# Patient Record
Sex: Female | Born: 1935 | Race: White | Hispanic: No | Smoking: Never smoker
Health system: Southern US, Community
[De-identification: ages and names within clinical notes are randomized; demographics above are authoritative.]

## PROBLEM LIST (undated history)

## (undated) DIAGNOSIS — R011 Cardiac murmur, unspecified: Secondary | ICD-10-CM

## (undated) DIAGNOSIS — Z8679 Personal history of other diseases of the circulatory system: Secondary | ICD-10-CM

## (undated) DIAGNOSIS — I491 Atrial premature depolarization: Secondary | ICD-10-CM

## (undated) DIAGNOSIS — D62 Acute posthemorrhagic anemia: Secondary | ICD-10-CM

## (undated) DIAGNOSIS — I2510 Atherosclerotic heart disease of native coronary artery without angina pectoris: Secondary | ICD-10-CM

## (undated) DIAGNOSIS — K219 Gastro-esophageal reflux disease without esophagitis: Secondary | ICD-10-CM

## (undated) DIAGNOSIS — J189 Pneumonia, unspecified organism: Secondary | ICD-10-CM

## (undated) DIAGNOSIS — S32020A Wedge compression fracture of second lumbar vertebra, initial encounter for closed fracture: Secondary | ICD-10-CM

## (undated) DIAGNOSIS — S62109A Fracture of unspecified carpal bone, unspecified wrist, initial encounter for closed fracture: Secondary | ICD-10-CM

## (undated) DIAGNOSIS — I341 Nonrheumatic mitral (valve) prolapse: Secondary | ICD-10-CM

## (undated) DIAGNOSIS — C801 Malignant (primary) neoplasm, unspecified: Secondary | ICD-10-CM

## (undated) DIAGNOSIS — I351 Nonrheumatic aortic (valve) insufficiency: Secondary | ICD-10-CM

## (undated) DIAGNOSIS — I712 Thoracic aortic aneurysm, without rupture: Principal | ICD-10-CM

## (undated) DIAGNOSIS — R55 Syncope and collapse: Secondary | ICD-10-CM

## (undated) DIAGNOSIS — S22000A Wedge compression fracture of unspecified thoracic vertebra, initial encounter for closed fracture: Secondary | ICD-10-CM

## (undated) DIAGNOSIS — I10 Essential (primary) hypertension: Secondary | ICD-10-CM

## (undated) DIAGNOSIS — M810 Age-related osteoporosis without current pathological fracture: Secondary | ICD-10-CM

## (undated) DIAGNOSIS — M1990 Unspecified osteoarthritis, unspecified site: Secondary | ICD-10-CM

## (undated) DIAGNOSIS — E039 Hypothyroidism, unspecified: Secondary | ICD-10-CM

## (undated) DIAGNOSIS — I314 Cardiac tamponade: Secondary | ICD-10-CM

## (undated) DIAGNOSIS — I4891 Unspecified atrial fibrillation: Secondary | ICD-10-CM

## (undated) DIAGNOSIS — E8770 Fluid overload, unspecified: Secondary | ICD-10-CM

## (undated) DIAGNOSIS — Z9289 Personal history of other medical treatment: Secondary | ICD-10-CM

## (undated) DIAGNOSIS — Z8639 Personal history of other endocrine, nutritional and metabolic disease: Secondary | ICD-10-CM

## (undated) DIAGNOSIS — N390 Urinary tract infection, site not specified: Secondary | ICD-10-CM

## (undated) HISTORY — DX: Atrial premature depolarization: I49.1

## (undated) HISTORY — DX: Cardiac tamponade: I31.4

## (undated) HISTORY — DX: Age-related osteoporosis without current pathological fracture: M81.0

## (undated) HISTORY — DX: Personal history of other endocrine, nutritional and metabolic disease: Z86.39

## (undated) HISTORY — DX: Thoracic aortic aneurysm, without rupture: I71.2

## (undated) HISTORY — PX: TONSILLECTOMY: SUR1361

## (undated) HISTORY — PX: COLONOSCOPY: SHX174

## (undated) HISTORY — PX: BREAST SURGERY: SHX581

## (undated) HISTORY — DX: Acute posthemorrhagic anemia: D62

## (undated) HISTORY — PX: ABLATION: SHX5711

## (undated) HISTORY — PX: OTHER SURGICAL HISTORY: SHX169

## (undated) HISTORY — DX: Personal history of other diseases of the circulatory system: Z86.79

## (undated) HISTORY — PX: TOE OSTEOTOMY: SHX1071

## (undated) HISTORY — DX: Unspecified atrial fibrillation: I48.91

## (undated) HISTORY — DX: Fluid overload, unspecified: E87.70

## (undated) HISTORY — DX: Nonrheumatic aortic (valve) insufficiency: I35.1

## (undated) HISTORY — PX: APPENDECTOMY: SHX54

## (undated) HISTORY — DX: Wedge compression fracture of second lumbar vertebra, initial encounter for closed fracture: S32.020A

## (undated) HISTORY — PX: HIP FRACTURE SURGERY: SHX118

---

## 1964-08-29 HISTORY — PX: TUBAL LIGATION: SHX77

## 1991-08-30 HISTORY — PX: HERNIA REPAIR: SHX51

## 1993-08-29 HISTORY — PX: PERCUTANEOUS PINNING WRIST FRACTURE: SHX2211

## 1999-01-13 ENCOUNTER — Ambulatory Visit (HOSPITAL_COMMUNITY): Admission: RE | Admit: 1999-01-13 | Discharge: 1999-01-13 | Payer: Self-pay | Admitting: Obstetrics & Gynecology

## 2000-09-27 ENCOUNTER — Other Ambulatory Visit: Admission: RE | Admit: 2000-09-27 | Discharge: 2000-09-27 | Payer: Self-pay | Admitting: Obstetrics & Gynecology

## 2001-08-29 HISTORY — PX: EYE SURGERY: SHX253

## 2002-12-30 ENCOUNTER — Other Ambulatory Visit: Admission: RE | Admit: 2002-12-30 | Discharge: 2002-12-30 | Payer: Self-pay | Admitting: Obstetrics & Gynecology

## 2005-03-24 ENCOUNTER — Other Ambulatory Visit: Admission: RE | Admit: 2005-03-24 | Discharge: 2005-03-24 | Payer: Self-pay | Admitting: Obstetrics & Gynecology

## 2009-01-08 ENCOUNTER — Encounter: Admission: RE | Admit: 2009-01-08 | Discharge: 2009-01-08 | Payer: Self-pay | Admitting: Emergency Medicine

## 2009-01-12 ENCOUNTER — Encounter: Admission: RE | Admit: 2009-01-12 | Discharge: 2009-01-12 | Payer: Self-pay | Admitting: Emergency Medicine

## 2009-01-15 ENCOUNTER — Ambulatory Visit (HOSPITAL_COMMUNITY): Admission: RE | Admit: 2009-01-15 | Discharge: 2009-01-15 | Payer: Self-pay | Admitting: Neurosurgery

## 2009-01-15 ENCOUNTER — Encounter: Payer: Self-pay | Admitting: Neurosurgery

## 2009-03-13 ENCOUNTER — Ambulatory Visit (HOSPITAL_COMMUNITY): Admission: RE | Admit: 2009-03-13 | Discharge: 2009-03-13 | Payer: Self-pay | Admitting: Neurosurgery

## 2009-03-17 HISTORY — PX: OTHER SURGICAL HISTORY: SHX169

## 2009-08-29 DIAGNOSIS — D62 Acute posthemorrhagic anemia: Secondary | ICD-10-CM

## 2009-08-29 DIAGNOSIS — E8770 Fluid overload, unspecified: Secondary | ICD-10-CM

## 2009-08-29 HISTORY — DX: Fluid overload, unspecified: E87.70

## 2009-08-29 HISTORY — DX: Acute posthemorrhagic anemia: D62

## 2010-05-29 DIAGNOSIS — I712 Thoracic aortic aneurysm, without rupture, unspecified: Secondary | ICD-10-CM

## 2010-05-29 DIAGNOSIS — I314 Cardiac tamponade: Secondary | ICD-10-CM

## 2010-05-29 HISTORY — DX: Thoracic aortic aneurysm, without rupture, unspecified: I71.20

## 2010-05-29 HISTORY — DX: Cardiac tamponade: I31.4

## 2010-05-29 HISTORY — DX: Thoracic aortic aneurysm, without rupture: I71.2

## 2010-06-24 ENCOUNTER — Inpatient Hospital Stay (HOSPITAL_COMMUNITY): Admission: EM | Admit: 2010-06-24 | Discharge: 2010-07-09 | Payer: Self-pay | Admitting: Emergency Medicine

## 2010-06-25 ENCOUNTER — Ambulatory Visit: Payer: Self-pay | Admitting: Cardiothoracic Surgery

## 2010-06-25 ENCOUNTER — Encounter: Payer: Self-pay | Admitting: Cardiothoracic Surgery

## 2010-06-25 HISTORY — PX: OTHER SURGICAL HISTORY: SHX169

## 2010-07-06 ENCOUNTER — Ambulatory Visit: Payer: Self-pay | Admitting: Physical Medicine & Rehabilitation

## 2010-07-09 ENCOUNTER — Inpatient Hospital Stay (HOSPITAL_COMMUNITY)
Admission: RE | Admit: 2010-07-09 | Discharge: 2010-07-15 | Payer: Self-pay | Admitting: Physical Medicine & Rehabilitation

## 2010-07-09 ENCOUNTER — Ambulatory Visit: Payer: Self-pay | Admitting: Physical Medicine & Rehabilitation

## 2010-07-14 ENCOUNTER — Encounter: Payer: Self-pay | Admitting: Cardiothoracic Surgery

## 2010-07-14 ENCOUNTER — Ambulatory Visit: Payer: Self-pay | Admitting: Vascular Surgery

## 2010-07-15 ENCOUNTER — Inpatient Hospital Stay (HOSPITAL_COMMUNITY): Admission: AD | Admit: 2010-07-15 | Discharge: 2010-07-21 | Payer: Self-pay | Admitting: Cardiovascular Disease

## 2010-07-15 DIAGNOSIS — F4320 Adjustment disorder, unspecified: Secondary | ICD-10-CM

## 2010-07-29 ENCOUNTER — Encounter: Admission: RE | Admit: 2010-07-29 | Discharge: 2010-07-29 | Payer: Self-pay | Admitting: Cardiothoracic Surgery

## 2010-07-29 ENCOUNTER — Encounter: Payer: Self-pay | Admitting: Internal Medicine

## 2010-07-29 ENCOUNTER — Ambulatory Visit: Payer: Self-pay | Admitting: Cardiothoracic Surgery

## 2010-08-06 ENCOUNTER — Encounter: Payer: Self-pay | Admitting: Internal Medicine

## 2010-08-17 ENCOUNTER — Ambulatory Visit
Admission: RE | Admit: 2010-08-17 | Discharge: 2010-08-17 | Payer: Self-pay | Source: Home / Self Care | Admitting: Cardiovascular Disease

## 2010-08-17 ENCOUNTER — Ambulatory Visit (HOSPITAL_COMMUNITY)
Admission: RE | Admit: 2010-08-17 | Discharge: 2010-08-17 | Payer: Self-pay | Source: Home / Self Care | Attending: Cardiovascular Disease | Admitting: Cardiovascular Disease

## 2010-09-02 DIAGNOSIS — I10 Essential (primary) hypertension: Secondary | ICD-10-CM | POA: Insufficient documentation

## 2010-09-02 DIAGNOSIS — E039 Hypothyroidism, unspecified: Secondary | ICD-10-CM | POA: Insufficient documentation

## 2010-09-03 ENCOUNTER — Ambulatory Visit
Admission: RE | Admit: 2010-09-03 | Discharge: 2010-09-03 | Payer: Self-pay | Source: Home / Self Care | Attending: Internal Medicine | Admitting: Internal Medicine

## 2010-09-03 ENCOUNTER — Encounter: Payer: Self-pay | Admitting: Internal Medicine

## 2010-09-03 ENCOUNTER — Telehealth: Payer: Self-pay | Admitting: Internal Medicine

## 2010-09-05 DIAGNOSIS — I471 Supraventricular tachycardia, unspecified: Secondary | ICD-10-CM | POA: Insufficient documentation

## 2010-09-30 NOTE — Letter (Signed)
Summary: ELectrophysiology/Ablation Procedure Instructions  [employer], Main Office  [address]   [place], [place] [address]   Phone: [phone]  Fax: [phone]     Electrophysiology/Ablation Procedure Instructions    You are scheduled for a(n) SVT Ablation on October 26, 2010 at 11:00 am with Dr. Johney Frame.  1.  Please come to the Short Stay Center at [facility] at 9:00 am on the day of your procedure.  2.  Come prepared to stay overnight.   Please bring your insurance cards and a list of your medications.  3.  Come to the [place] office on October 22, 2010 at 9:30 am for lab work.  The lab at [facility] is open from 8:30 AM to 1:30 PM and 2:30 PM to 5:00 PM.  The lab at [facility] is open from 7:30 AM to 5:30 PM.  You do not have to be fasting.  4.  Do not have anything to eat or drink after midnight the night before your procedure.  5.  Educational material received:  _____ EP   _x____ Ablation   * Occasionally, EP studies and ablations can become lengthy.  Please make your family aware of this before your procedure starts.  Average time ranges from 2-8 hours for EP studies/ablations.  Your physician will locate your family after the procedure with the results.  * If you have any questions after you get home, please call the office at [phone]. Dennis Bast, RN

## 2010-09-30 NOTE — Letter (Signed)
Summary: [facility]  [facility]   Imported By: Marylou Mccoy 08/06/2010 12:48:43  _____________________________________________________________________  External Attachment:    Type:   Image     Comment:   External Document

## 2010-09-30 NOTE — Progress Notes (Signed)
Summary: change appt for Ablation Procedure  Phone Note Call from Patient Call back at [phone]   Caller: Daughter/jenny Reason for Call: Talk to Nurse Summary of Call: pt needs to change appt date for Ablation Procedure Initial call taken by: Roe Coombs,  September 03, 2010 3:11 PM  Follow-up for Phone Call        left msg for pt dtr. Yvonne Gladden, RN, BSN 09/03/10 1710 pt dtr wants to move procedure to 2/21 and lab to 17 at 930. adv will call and get time on Monday. Follow-up by: Yvonne Gladden RN,  September 03, 2010 6:26 PM  Additional Follow-up for Phone Call Additional follow up Details #1::        called daughter back and left message with procedure date change  Yvonne [name] rescheduled for pt Yvonne Bast, RN, BSN  September 07, 2010 12:27 PM

## 2010-09-30 NOTE — Letter (Signed)
Summary: [facility]  [facility]   Imported By: Marylou Mccoy 09/20/2010 15:59:29  _____________________________________________________________________  External Attachment:    Type:   Image     Comment:   External Document

## 2010-09-30 NOTE — Assessment & Plan Note (Addendum)
Summary: new ep/atrial tach/amber   Visit Type:  Initial Consult  CC:   .  History of Present Illness: The patient presents today for electrophysiology followup. I saw her during her recent hospitalizaiton at [facility] after her presentation on June 24, 2010, with a ruptured ascending aorta.  On June 25, 2010, the diagnosis was made and she underwent  emergency replacement of the ascending aorta, hemiarch, and resuspension of the aortic valve with circulatory arrest.  She had a fairly long postoperative course, but not unreasonable considering her age of 51 years and fairly frail status.  Postoperatively, she developed a narrow complex tachycardia which terminated with carotid massage.  She was observed to have recurrence of this tachycardia which did not respond to diltiazem or amiodarone but has been well controlled on flecainide. She reports abrupt onset and termination of tachycardia for several years.  She has taken flecainide previously with good control.  She is unaware of any further arrhythmias since discharge. She has a lymphocele over her R groin which has required draining.  She is otherwise without complaint today.  Current Medications (verified): 1)  Tambocor 50 Mg Tabs (Flecainide Acetate) .Marland Kitchen.. 1 Two Times A Day 2)  Synthroid 50 Mcg Tabs (Levothyroxine Sodium) .Marland Kitchen.. 1 Once Daily 3)  Metoprolol Succinate 50 Mg Xr24h-Tab (Metoprolol Succinate) .Marland Kitchen.. 1 Q D 4)  Ecotrin 325 Mg Tbec (Aspirin) .Marland Kitchen.. 1 Once Daily 5)  Calcium/vitamin D/minerals 600-200 Mg-Unit Tabs (Calcium Carbonate-Vit D-Min) .Marland Kitchen.. 1 Two Times A Day 6)  Ferrous Gluconate 324 (38 Fe) Mg Tabs (Ferrous Gluconate) .Marland Kitchen.. 1 Once Daily 7)  Folic Acid 1 Mg Tabs (Folic Acid) .Marland Kitchen.. 1 Once Daily 8)  Celexa 10 Mg Tabs (Citalopram Hydrobromide) .Marland Kitchen.. 1 Once Daily  Past History:  Past Medical History:  1. SVT  2. Low-risk Myoview in September 2011 with normal left ventricular function.   3. Vascular disease with a ruptured  ascending aortic aneurysm  requiring urgent surgeries on June 25, 2010, by Dr. Tyrone Sage.  5. Right groin lymphocele, drained this admission by Cardiovascular- Thoracic Surgery, culture negative.  6. Treated hypothyroidism.  7. Depression.  8. HTN 9. DDD  Past Surgical History:  L2  kyphoplasty    s/p APPY 1951  s/p L hip replacement 1995  Ruptured aortic aneurysm repair 10/11  Bil wrist Fx 1994  hernia repair  Family History: Reviewed history from 09/02/2010 and no changes required. + strokes,  brother with AAA at age 47  Social History:  The patient lives with daughter.  denies TED.  Retired Engineer, civil (consulting)  Review of Systems       All systems are reviewed and negative except as listed in the HPI.   Vital Signs:  Patient profile:   77 year old female Height:      67 inches Weight:      118.50 pounds BMI:     18.63 Pulse rate:   58 / minute Pulse rhythm:   regular BP sitting:   86 / 40  (left arm) Cuff size:   regular  Vitals Entered By: Scherrie Bateman, LPN (September 03, 2010 10:25 AM)  Physical Exam  General:  thin elderly remale, NAD Head:  normocephalic and atraumatic Eyes:  PERRLA/EOM intact; conjunctiva and lids normal. Mouth:  Teeth, gums and palate normal. Oral mucosa normal. Neck:  supple Chest Wall:  healed sternotomy Lungs:  Clear bilaterally to auscultation and percussion. Heart:  RRR, no m/r/g Abdomen:  Bowel sounds positive; abdomen soft and non-tender without masses, organomegaly,  or hernias noted. No hepatosplenomegaly. Msk:  Back normal, normal gait. Muscle strength and tone normal. Extremities:  No clubbing or cyanosis.  1cm x 2cm lymphocele R groin Neurologic:  Alert and oriented x 3. Skin:  Intact without lesions or rashes. Psych:  Normal affect.   EKG  Procedure date:  09/03/2010  Findings:      sinus bradycardia 58 bpm, PR 228, Qtc 460, nonspecific ST/T changes  Impression & Recommendations:  Problem # 1:  SUPRAVENTRICULAR TACHYCARDIA  (ICD-427.89) The patient has recurrent symptomatic SVT.  During her recent hospitalization, she was observed to have a mid RP tachycardia which terminated with carotid massage.  Therapeutic strategies for SVT including medicine and ablation were discussed in detail with the patient today. Risk, benefits, and alternatives to EP study and radiofrequency ablation were also discussed in detail today. These risks include but are not limited to stroke, bleeding, vascular damage, tamponade, perforation, damage to the heart and other structures,  AV block requiring pacemaker, worsening renal function, and death. The patient understands these risk and wishes to proceed.  We will therefore schedule catheter ablation at the next available time.  Problem # 2:  HYPERTENSION (ICD-401.9) stable  Her updated medication list for this problem includes:    Metoprolol Succinate 50 Mg Xr24h-tab (Metoprolol succinate) .Marland Kitchen... 1 q d    Ecotrin 325 Mg Tbec (Aspirin) .Marland Kitchen... 1 once daily  Problem # 3:  HYPOTHYROIDISM (ICD-244.9) stable Her updated medication list for this problem includes:    Synthroid 50 Mcg Tabs (Levothyroxine sodium) .Marland Kitchen... 1 once daily  Patient Instructions: 1)  Your physician recommends that you continue on your current medications as directed. Please refer to the Current Medication list given to you today. 2)  Your physician has recommended that you have an ablation.  Catheter ablation is a medical procedure used to treat some cardiac arrhythmias (irregular heartbeats). During catheter ablation, a long, thin, flexible tube is put into a blood vessel in your groin (upper thigh), or neck. This tube is called an ablation catheter. It is then guided to your heart through the blood vessel. Radiofrequency waves destroy small areas of heart tissue where abnormal heartbeats may cause an arrhythmia to start.  Please see the instruction sheet given to you today. 3)  Hold Tambocor 3 days before the procedure and  hold Metoprolol 2 days before the procedure.   Appended Document: new ep/atrial tach/amber    Clinical Lists Changes  Observations: Added new observation of NKA: T (10/07/2010 14:49)

## 2010-10-14 ENCOUNTER — Telehealth: Payer: Self-pay | Admitting: Internal Medicine

## 2010-10-14 ENCOUNTER — Other Ambulatory Visit: Payer: Self-pay

## 2010-10-15 ENCOUNTER — Encounter: Payer: Self-pay | Admitting: Internal Medicine

## 2010-10-15 ENCOUNTER — Other Ambulatory Visit (INDEPENDENT_AMBULATORY_CARE_PROVIDER_SITE_OTHER): Payer: Medicare Other

## 2010-10-15 ENCOUNTER — Other Ambulatory Visit: Payer: Self-pay | Admitting: Internal Medicine

## 2010-10-15 DIAGNOSIS — I499 Cardiac arrhythmia, unspecified: Secondary | ICD-10-CM

## 2010-10-15 DIAGNOSIS — I498 Other specified cardiac arrhythmias: Secondary | ICD-10-CM

## 2010-10-15 DIAGNOSIS — I10 Essential (primary) hypertension: Secondary | ICD-10-CM

## 2010-10-15 LAB — CBC WITH DIFFERENTIAL/PLATELET
Basophils Relative: 0.4 % (ref 0.0–3.0)
Eosinophils Absolute: 0.2 10*3/uL (ref 0.0–0.7)
Eosinophils Relative: 3.2 % (ref 0.0–5.0)
Hemoglobin: 12.1 g/dL (ref 12.0–15.0)
Lymphocytes Relative: 16.7 % (ref 12.0–46.0)
MCHC: 33.5 g/dL (ref 30.0–36.0)
Monocytes Relative: 6.8 % (ref 3.0–12.0)
Neutro Abs: 5.3 10*3/uL (ref 1.4–7.7)
RBC: 4.07 Mil/uL (ref 3.87–5.11)
WBC: 7.3 10*3/uL (ref 4.5–10.5)

## 2010-10-15 LAB — BASIC METABOLIC PANEL
CO2: 31 mEq/L (ref 19–32)
Calcium: 9.2 mg/dL (ref 8.4–10.5)
Chloride: 102 mEq/L (ref 96–112)
Sodium: 137 mEq/L (ref 135–145)

## 2010-10-15 LAB — PROTIME-INR: INR: 1 ratio (ref 0.8–1.0)

## 2010-10-19 ENCOUNTER — Observation Stay (HOSPITAL_COMMUNITY)
Admission: RE | Admit: 2010-10-19 | Discharge: 2010-10-20 | Disposition: A | Discharge status: Home / Self Care | Payer: Medicare Other | Source: Ambulatory Visit | Attending: Internal Medicine | Admitting: Internal Medicine

## 2010-10-19 DIAGNOSIS — F3289 Other specified depressive episodes: Secondary | ICD-10-CM | POA: Insufficient documentation

## 2010-10-19 DIAGNOSIS — I498 Other specified cardiac arrhythmias: Principal | ICD-10-CM | POA: Insufficient documentation

## 2010-10-19 DIAGNOSIS — I471 Supraventricular tachycardia: Secondary | ICD-10-CM

## 2010-10-19 DIAGNOSIS — E039 Hypothyroidism, unspecified: Secondary | ICD-10-CM | POA: Insufficient documentation

## 2010-10-19 DIAGNOSIS — I10 Essential (primary) hypertension: Secondary | ICD-10-CM | POA: Insufficient documentation

## 2010-10-19 DIAGNOSIS — IMO0002 Reserved for concepts with insufficient information to code with codable children: Secondary | ICD-10-CM | POA: Insufficient documentation

## 2010-10-19 DIAGNOSIS — F329 Major depressive disorder, single episode, unspecified: Secondary | ICD-10-CM | POA: Insufficient documentation

## 2010-10-20 DIAGNOSIS — I471 Supraventricular tachycardia: Secondary | ICD-10-CM

## 2010-10-20 HISTORY — PX: OTHER SURGICAL HISTORY: SHX169

## 2010-10-20 NOTE — Progress Notes (Signed)
Summary: confirm her mother ablation date/ time  Phone Note Call from Patient Call back at Home Phone [phone]   Caller: Daughter-jenny [phone] Reason for Call: Talk to Nurse Summary of Call: pt dtr wants to confirm her mother ablation date/ time. Initial call taken by: Lorne Skeens,  October 14, 2010 8:34 AM  Follow-up for Phone Call        spoke with daughter she is aware of time and will be here for labs tomorow Dennis Bast, RN, BSN  October 14, 2010 11:47 AM

## 2010-10-28 ENCOUNTER — Encounter (INDEPENDENT_AMBULATORY_CARE_PROVIDER_SITE_OTHER): Payer: Medicare Other | Admitting: Cardiothoracic Surgery

## 2010-10-28 DIAGNOSIS — I712 Thoracic aortic aneurysm, without rupture, unspecified: Secondary | ICD-10-CM

## 2010-10-29 NOTE — Assessment & Plan Note (Signed)
OFFICE VISIT  [name], Yvonne L DOB:  1925/12/19                                        October 28, 2010 CHART #:  [id number]  The patient returns to office today in followup after her ruptured ascending aorta with replacement of ascending aorta hemiarch, resuspension aortic valve with circulatory arrest on June 25, 2010. Both preoperatively and postoperatively, she had significant atrial arrhythmias.  Since surgery in January, she underwent AFib ablation by Dr. Johney Frame.  From all indications, this has gone well.  She is now waiting to have her painful left hip revised in late March.  Overall other than being wheelchair bound because of her hip, she is doing well.  Her appetite has returned.  She denies any chest pain.  On exam, her blood pressure 123/69, pulse 70, respiratory rate 16, O2 sats 97%.  Her sternum is stable and well healed.  Valve sounds are crisp without any murmur of aortic insufficiency.  She has no lower extremity edema.  Overall, she appears much improved from her last visit, awaiting now for her hip prosthesis repair.  I have made a return appointment to see me in 6 months with a CTA of the thoracic aorta to follow up on her previous aortic repair.  Sheliah Plane, MD Electronically Signed  EG/MEDQ  D:  10/28/2010  T:  10/28/2010  Job:  [id number]  cc:   Thurmon Fair, MD Hillis Range, MD Ollen Gross, M.D.

## 2010-11-09 LAB — BASIC METABOLIC PANEL
BUN: 10 mg/dL (ref 6–23)
BUN: 11 mg/dL (ref 6–23)
BUN: 13 mg/dL (ref 6–23)
BUN: 13 mg/dL (ref 6–23)
BUN: 14 mg/dL (ref 6–23)
BUN: 16 mg/dL (ref 6–23)
BUN: 16 mg/dL (ref 6–23)
BUN: 16 mg/dL (ref 6–23)
BUN: 19 mg/dL (ref 6–23)
BUN: 19 mg/dL (ref 6–23)
BUN: 20 mg/dL (ref 6–23)
BUN: 25 mg/dL — ABNORMAL HIGH (ref 6–23)
BUN: 28 mg/dL — ABNORMAL HIGH (ref 6–23)
BUN: 35 mg/dL — ABNORMAL HIGH (ref 6–23)
BUN: 37 mg/dL — ABNORMAL HIGH (ref 6–23)
BUN: 38 mg/dL — ABNORMAL HIGH (ref 6–23)
BUN: 7 mg/dL (ref 6–23)
BUN: 9 mg/dL (ref 6–23)
BUN: 9 mg/dL (ref 6–23)
CO2: 26 mEq/L (ref 19–32)
CO2: 27 mEq/L (ref 19–32)
CO2: 28 mEq/L (ref 19–32)
CO2: 28 mEq/L (ref 19–32)
CO2: 28 mEq/L (ref 19–32)
CO2: 30 mEq/L (ref 19–32)
CO2: 30 mEq/L (ref 19–32)
CO2: 31 mEq/L (ref 19–32)
CO2: 31 mEq/L (ref 19–32)
CO2: 33 mEq/L — ABNORMAL HIGH (ref 19–32)
CO2: 33 mEq/L — ABNORMAL HIGH (ref 19–32)
CO2: 33 mEq/L — ABNORMAL HIGH (ref 19–32)
CO2: 33 mEq/L — ABNORMAL HIGH (ref 19–32)
CO2: 33 mEq/L — ABNORMAL HIGH (ref 19–32)
CO2: 33 mEq/L — ABNORMAL HIGH (ref 19–32)
CO2: 34 mEq/L — ABNORMAL HIGH (ref 19–32)
CO2: 34 mEq/L — ABNORMAL HIGH (ref 19–32)
CO2: 34 mEq/L — ABNORMAL HIGH (ref 19–32)
CO2: 34 mEq/L — ABNORMAL HIGH (ref 19–32)
CO2: 36 mEq/L — ABNORMAL HIGH (ref 19–32)
Calcium: 7.6 mg/dL — ABNORMAL LOW (ref 8.4–10.5)
Calcium: 7.9 mg/dL — ABNORMAL LOW (ref 8.4–10.5)
Calcium: 8.1 mg/dL — ABNORMAL LOW (ref 8.4–10.5)
Calcium: 8.1 mg/dL — ABNORMAL LOW (ref 8.4–10.5)
Calcium: 8.3 mg/dL — ABNORMAL LOW (ref 8.4–10.5)
Calcium: 8.4 mg/dL (ref 8.4–10.5)
Calcium: 8.5 mg/dL (ref 8.4–10.5)
Calcium: 8.5 mg/dL (ref 8.4–10.5)
Calcium: 8.6 mg/dL (ref 8.4–10.5)
Calcium: 8.6 mg/dL (ref 8.4–10.5)
Calcium: 8.6 mg/dL (ref 8.4–10.5)
Calcium: 8.7 mg/dL (ref 8.4–10.5)
Calcium: 8.7 mg/dL (ref 8.4–10.5)
Calcium: 8.8 mg/dL (ref 8.4–10.5)
Calcium: 8.8 mg/dL (ref 8.4–10.5)
Calcium: 8.8 mg/dL (ref 8.4–10.5)
Calcium: 8.9 mg/dL (ref 8.4–10.5)
Chloride: 100 mEq/L (ref 96–112)
Chloride: 101 mEq/L (ref 96–112)
Chloride: 101 mEq/L (ref 96–112)
Chloride: 101 mEq/L (ref 96–112)
Chloride: 101 mEq/L (ref 96–112)
Chloride: 102 mEq/L (ref 96–112)
Chloride: 103 mEq/L (ref 96–112)
Chloride: 104 mEq/L (ref 96–112)
Chloride: 104 mEq/L (ref 96–112)
Chloride: 106 mEq/L (ref 96–112)
Chloride: 108 mEq/L (ref 96–112)
Chloride: 96 mEq/L (ref 96–112)
Chloride: 98 mEq/L (ref 96–112)
Chloride: 98 mEq/L (ref 96–112)
Chloride: 98 mEq/L (ref 96–112)
Chloride: 98 mEq/L (ref 96–112)
Chloride: 98 mEq/L (ref 96–112)
Chloride: 99 mEq/L (ref 96–112)
Chloride: 99 mEq/L (ref 96–112)
Creatinine, Ser: 0.84 mg/dL (ref 0.4–1.2)
Creatinine, Ser: 0.9 mg/dL (ref 0.4–1.2)
Creatinine, Ser: 0.91 mg/dL (ref 0.4–1.2)
Creatinine, Ser: 0.93 mg/dL (ref 0.4–1.2)
Creatinine, Ser: 0.93 mg/dL (ref 0.4–1.2)
Creatinine, Ser: 0.96 mg/dL (ref 0.4–1.2)
Creatinine, Ser: 0.96 mg/dL (ref 0.4–1.2)
Creatinine, Ser: 0.97 mg/dL (ref 0.4–1.2)
Creatinine, Ser: 0.97 mg/dL (ref 0.4–1.2)
Creatinine, Ser: 0.99 mg/dL (ref 0.4–1.2)
Creatinine, Ser: 1.02 mg/dL (ref 0.4–1.2)
Creatinine, Ser: 1.06 mg/dL (ref 0.4–1.2)
Creatinine, Ser: 1.07 mg/dL (ref 0.4–1.2)
Creatinine, Ser: 1.11 mg/dL (ref 0.4–1.2)
Creatinine, Ser: 1.12 mg/dL (ref 0.4–1.2)
Creatinine, Ser: 1.19 mg/dL (ref 0.4–1.2)
Creatinine, Ser: 1.24 mg/dL — ABNORMAL HIGH (ref 0.4–1.2)
Creatinine, Ser: 1.25 mg/dL — ABNORMAL HIGH (ref 0.4–1.2)
Creatinine, Ser: 1.3 mg/dL — ABNORMAL HIGH (ref 0.4–1.2)
GFR calc Af Amer: 48 mL/min — ABNORMAL LOW (ref >60)
GFR calc Af Amer: 50 mL/min — ABNORMAL LOW (ref >60)
GFR calc Af Amer: 50 mL/min — ABNORMAL LOW (ref >60)
GFR calc Af Amer: 53 mL/min — ABNORMAL LOW (ref >60)
GFR calc Af Amer: 56 mL/min — ABNORMAL LOW (ref >60)
GFR calc Af Amer: 57 mL/min — ABNORMAL LOW (ref >60)
GFR calc Af Amer: 59 mL/min — ABNORMAL LOW (ref >60)
GFR calc Af Amer: >60 mL/min (ref >60)
GFR calc Af Amer: >60 mL/min (ref >60)
GFR calc Af Amer: >60 mL/min (ref >60)
GFR calc Af Amer: >60 mL/min (ref >60)
GFR calc Af Amer: >60 mL/min (ref >60)
GFR calc Af Amer: >60 mL/min (ref >60)
GFR calc Af Amer: >60 mL/min (ref >60)
GFR calc Af Amer: >60 mL/min (ref >60)
GFR calc Af Amer: >60 mL/min (ref >60)
GFR calc Af Amer: >60 mL/min (ref >60)
GFR calc non Af Amer: 39 mL/min — ABNORMAL LOW (ref >60)
GFR calc non Af Amer: 41 mL/min — ABNORMAL LOW (ref >60)
GFR calc non Af Amer: 42 mL/min — ABNORMAL LOW (ref >60)
GFR calc non Af Amer: 44 mL/min — ABNORMAL LOW (ref >60)
GFR calc non Af Amer: 47 mL/min — ABNORMAL LOW (ref >60)
GFR calc non Af Amer: 49 mL/min — ABNORMAL LOW (ref >60)
GFR calc non Af Amer: 50 mL/min — ABNORMAL LOW (ref >60)
GFR calc non Af Amer: 52 mL/min — ABNORMAL LOW (ref >60)
GFR calc non Af Amer: 55 mL/min — ABNORMAL LOW (ref >60)
GFR calc non Af Amer: 55 mL/min — ABNORMAL LOW (ref >60)
GFR calc non Af Amer: 56 mL/min — ABNORMAL LOW (ref >60)
GFR calc non Af Amer: 57 mL/min — ABNORMAL LOW (ref >60)
GFR calc non Af Amer: 58 mL/min — ABNORMAL LOW (ref >60)
GFR calc non Af Amer: 58 mL/min — ABNORMAL LOW (ref >60)
GFR calc non Af Amer: 59 mL/min — ABNORMAL LOW (ref >60)
GFR calc non Af Amer: 60 mL/min — ABNORMAL LOW (ref >60)
GFR calc non Af Amer: >60 mL/min (ref >60)
Glucose, Bld: 100 mg/dL — ABNORMAL HIGH (ref 70–99)
Glucose, Bld: 102 mg/dL — ABNORMAL HIGH (ref 70–99)
Glucose, Bld: 105 mg/dL — ABNORMAL HIGH (ref 70–99)
Glucose, Bld: 105 mg/dL — ABNORMAL HIGH (ref 70–99)
Glucose, Bld: 107 mg/dL — ABNORMAL HIGH (ref 70–99)
Glucose, Bld: 109 mg/dL — ABNORMAL HIGH (ref 70–99)
Glucose, Bld: 114 mg/dL — ABNORMAL HIGH (ref 70–99)
Glucose, Bld: 115 mg/dL — ABNORMAL HIGH (ref 70–99)
Glucose, Bld: 117 mg/dL — ABNORMAL HIGH (ref 70–99)
Glucose, Bld: 120 mg/dL — ABNORMAL HIGH (ref 70–99)
Glucose, Bld: 126 mg/dL — ABNORMAL HIGH (ref 70–99)
Glucose, Bld: 129 mg/dL — ABNORMAL HIGH (ref 70–99)
Glucose, Bld: 132 mg/dL — ABNORMAL HIGH (ref 70–99)
Glucose, Bld: 149 mg/dL — ABNORMAL HIGH (ref 70–99)
Glucose, Bld: 168 mg/dL — ABNORMAL HIGH (ref 70–99)
Glucose, Bld: 232 mg/dL — ABNORMAL HIGH (ref 70–99)
Glucose, Bld: 95 mg/dL (ref 70–99)
Glucose, Bld: 99 mg/dL (ref 70–99)
Potassium: 3.5 mEq/L (ref 3.5–5.1)
Potassium: 3.6 mEq/L (ref 3.5–5.1)
Potassium: 3.7 mEq/L (ref 3.5–5.1)
Potassium: 3.8 mEq/L (ref 3.5–5.1)
Potassium: 3.9 mEq/L (ref 3.5–5.1)
Potassium: 4 mEq/L (ref 3.5–5.1)
Potassium: 4 mEq/L (ref 3.5–5.1)
Potassium: 4 mEq/L (ref 3.5–5.1)
Potassium: 4 mEq/L (ref 3.5–5.1)
Potassium: 4.2 mEq/L (ref 3.5–5.1)
Potassium: 4.3 mEq/L (ref 3.5–5.1)
Potassium: 4.3 mEq/L (ref 3.5–5.1)
Potassium: 4.3 mEq/L (ref 3.5–5.1)
Potassium: 4.4 mEq/L (ref 3.5–5.1)
Potassium: 4.4 mEq/L (ref 3.5–5.1)
Potassium: 4.5 mEq/L (ref 3.5–5.1)
Potassium: 4.7 mEq/L (ref 3.5–5.1)
Sodium: 131 mEq/L — ABNORMAL LOW (ref 135–145)
Sodium: 136 mEq/L (ref 135–145)
Sodium: 137 mEq/L (ref 135–145)
Sodium: 137 mEq/L (ref 135–145)
Sodium: 137 mEq/L (ref 135–145)
Sodium: 137 mEq/L (ref 135–145)
Sodium: 138 mEq/L (ref 135–145)
Sodium: 138 mEq/L (ref 135–145)
Sodium: 139 mEq/L (ref 135–145)
Sodium: 139 mEq/L (ref 135–145)
Sodium: 139 mEq/L (ref 135–145)
Sodium: 139 mEq/L (ref 135–145)
Sodium: 139 mEq/L (ref 135–145)
Sodium: 140 mEq/L (ref 135–145)
Sodium: 140 mEq/L (ref 135–145)
Sodium: 141 mEq/L (ref 135–145)
Sodium: 141 mEq/L (ref 135–145)

## 2010-11-09 LAB — CBC
HCT: 25.5 % — ABNORMAL LOW (ref 36.0–46.0)
HCT: 26.2 % — ABNORMAL LOW (ref 36.0–46.0)
HCT: 26.4 % — ABNORMAL LOW (ref 36.0–46.0)
HCT: 26.5 % — ABNORMAL LOW (ref 36.0–46.0)
HCT: 26.7 % — ABNORMAL LOW (ref 36.0–46.0)
HCT: 27.2 % — ABNORMAL LOW (ref 36.0–46.0)
HCT: 27.6 % — ABNORMAL LOW (ref 36.0–46.0)
HCT: 27.7 % — ABNORMAL LOW (ref 36.0–46.0)
HCT: 28 % — ABNORMAL LOW (ref 36.0–46.0)
HCT: 28.1 % — ABNORMAL LOW (ref 36.0–46.0)
HCT: 28.1 % — ABNORMAL LOW (ref 36.0–46.0)
HCT: 28.2 % — ABNORMAL LOW (ref 36.0–46.0)
HCT: 28.3 % — ABNORMAL LOW (ref 36.0–46.0)
HCT: 28.4 % — ABNORMAL LOW (ref 36.0–46.0)
HCT: 28.5 % — ABNORMAL LOW (ref 36.0–46.0)
HCT: 28.8 % — ABNORMAL LOW (ref 36.0–46.0)
HCT: 28.9 % — ABNORMAL LOW (ref 36.0–46.0)
HCT: 30.4 % — ABNORMAL LOW (ref 36.0–46.0)
HCT: 30.5 % — ABNORMAL LOW (ref 36.0–46.0)
HCT: 30.6 % — ABNORMAL LOW (ref 36.0–46.0)
Hemoglobin: 8.3 g/dL — ABNORMAL LOW (ref 12.0–15.0)
Hemoglobin: 8.4 g/dL — ABNORMAL LOW (ref 12.0–15.0)
Hemoglobin: 8.5 g/dL — ABNORMAL LOW (ref 12.0–15.0)
Hemoglobin: 8.7 g/dL — ABNORMAL LOW (ref 12.0–15.0)
Hemoglobin: 8.8 g/dL — ABNORMAL LOW (ref 12.0–15.0)
Hemoglobin: 8.8 g/dL — ABNORMAL LOW (ref 12.0–15.0)
Hemoglobin: 8.9 g/dL — ABNORMAL LOW (ref 12.0–15.0)
Hemoglobin: 9 g/dL — ABNORMAL LOW (ref 12.0–15.0)
Hemoglobin: 9 g/dL — ABNORMAL LOW (ref 12.0–15.0)
Hemoglobin: 9.1 g/dL — ABNORMAL LOW (ref 12.0–15.0)
Hemoglobin: 9.1 g/dL — ABNORMAL LOW (ref 12.0–15.0)
Hemoglobin: 9.1 g/dL — ABNORMAL LOW (ref 12.0–15.0)
Hemoglobin: 9.2 g/dL — ABNORMAL LOW (ref 12.0–15.0)
Hemoglobin: 9.6 g/dL — ABNORMAL LOW (ref 12.0–15.0)
Hemoglobin: 9.6 g/dL — ABNORMAL LOW (ref 12.0–15.0)
Hemoglobin: 9.6 g/dL — ABNORMAL LOW (ref 12.0–15.0)
Hemoglobin: 9.7 g/dL — ABNORMAL LOW (ref 12.0–15.0)
MCH: 29.9 pg (ref 26.0–34.0)
MCH: 30.2 pg (ref 26.0–34.0)
MCH: 30.3 pg (ref 26.0–34.0)
MCH: 30.3 pg (ref 26.0–34.0)
MCH: 30.3 pg (ref 26.0–34.0)
MCH: 30.5 pg (ref 26.0–34.0)
MCH: 30.6 pg (ref 26.0–34.0)
MCH: 30.6 pg (ref 26.0–34.0)
MCH: 30.7 pg (ref 26.0–34.0)
MCH: 30.7 pg (ref 26.0–34.0)
MCH: 30.7 pg (ref 26.0–34.0)
MCH: 30.7 pg (ref 26.0–34.0)
MCH: 30.8 pg (ref 26.0–34.0)
MCH: 30.8 pg (ref 26.0–34.0)
MCH: 30.9 pg (ref 26.0–34.0)
MCH: 31 pg (ref 26.0–34.0)
MCH: 31.1 pg (ref 26.0–34.0)
MCH: 31.1 pg (ref 26.0–34.0)
MCH: 31.3 pg (ref 26.0–34.0)
MCH: 31.3 pg (ref 26.0–34.0)
MCHC: 31.4 g/dL (ref 30.0–36.0)
MCHC: 31.6 g/dL (ref 30.0–36.0)
MCHC: 31.6 g/dL (ref 30.0–36.0)
MCHC: 31.7 g/dL (ref 30.0–36.0)
MCHC: 31.9 g/dL (ref 30.0–36.0)
MCHC: 32.1 g/dL (ref 30.0–36.0)
MCHC: 32.1 g/dL (ref 30.0–36.0)
MCHC: 32.1 g/dL (ref 30.0–36.0)
MCHC: 32.2 g/dL (ref 30.0–36.0)
MCHC: 32.4 g/dL (ref 30.0–36.0)
MCHC: 32.4 g/dL (ref 30.0–36.0)
MCHC: 32.5 g/dL (ref 30.0–36.0)
MCHC: 32.6 g/dL (ref 30.0–36.0)
MCHC: 32.6 g/dL (ref 30.0–36.0)
MCHC: 33 g/dL (ref 30.0–36.0)
MCHC: 33 g/dL (ref 30.0–36.0)
MCHC: 33.2 g/dL (ref 30.0–36.0)
MCHC: 33.7 g/dL (ref 30.0–36.0)
MCHC: 33.8 g/dL (ref 30.0–36.0)
MCV: 91.7 fL (ref 78.0–100.0)
MCV: 92.3 fL (ref 78.0–100.0)
MCV: 92.5 fL (ref 78.0–100.0)
MCV: 93 fL (ref 78.0–100.0)
MCV: 93.4 fL (ref 78.0–100.0)
MCV: 93.8 fL (ref 78.0–100.0)
MCV: 94.4 fL (ref 78.0–100.0)
MCV: 94.9 fL (ref 78.0–100.0)
MCV: 94.9 fL (ref 78.0–100.0)
MCV: 95 fL (ref 78.0–100.0)
MCV: 95.2 fL (ref 78.0–100.0)
MCV: 95.3 fL (ref 78.0–100.0)
MCV: 95.5 fL (ref 78.0–100.0)
MCV: 95.6 fL (ref 78.0–100.0)
MCV: 95.6 fL (ref 78.0–100.0)
MCV: 95.8 fL (ref 78.0–100.0)
MCV: 96 fL (ref 78.0–100.0)
MCV: 96.1 fL (ref 78.0–100.0)
MCV: 96.2 fL (ref 78.0–100.0)
MCV: 96.2 fL (ref 78.0–100.0)
MCV: 97.3 fL (ref 78.0–100.0)
Platelets: 103 10*3/uL — ABNORMAL LOW (ref 150–400)
Platelets: 106 10*3/uL — ABNORMAL LOW (ref 150–400)
Platelets: 112 10*3/uL — ABNORMAL LOW (ref 150–400)
Platelets: 153 10*3/uL (ref 150–400)
Platelets: 156 10*3/uL (ref 150–400)
Platelets: 180 10*3/uL (ref 150–400)
Platelets: 192 10*3/uL (ref 150–400)
Platelets: 198 10*3/uL (ref 150–400)
Platelets: 211 10*3/uL (ref 150–400)
Platelets: 212 10*3/uL (ref 150–400)
Platelets: 225 10*3/uL (ref 150–400)
Platelets: 257 10*3/uL (ref 150–400)
Platelets: 260 10*3/uL (ref 150–400)
Platelets: 275 10*3/uL (ref 150–400)
Platelets: 283 10*3/uL (ref 150–400)
Platelets: 288 10*3/uL (ref 150–400)
Platelets: 84 10*3/uL — ABNORMAL LOW (ref 150–400)
Platelets: 96 10*3/uL — ABNORMAL LOW (ref 150–400)
Platelets: 97 10*3/uL — ABNORMAL LOW (ref 150–400)
RBC: 2.7 MIL/uL — ABNORMAL LOW (ref 3.87–5.11)
RBC: 2.74 MIL/uL — ABNORMAL LOW (ref 3.87–5.11)
RBC: 2.76 MIL/uL — ABNORMAL LOW (ref 3.87–5.11)
RBC: 2.84 MIL/uL — ABNORMAL LOW (ref 3.87–5.11)
RBC: 2.86 MIL/uL — ABNORMAL LOW (ref 3.87–5.11)
RBC: 2.89 MIL/uL — ABNORMAL LOW (ref 3.87–5.11)
RBC: 2.89 MIL/uL — ABNORMAL LOW (ref 3.87–5.11)
RBC: 2.9 MIL/uL — ABNORMAL LOW (ref 3.87–5.11)
RBC: 2.91 MIL/uL — ABNORMAL LOW (ref 3.87–5.11)
RBC: 2.94 MIL/uL — ABNORMAL LOW (ref 3.87–5.11)
RBC: 2.94 MIL/uL — ABNORMAL LOW (ref 3.87–5.11)
RBC: 2.96 MIL/uL — ABNORMAL LOW (ref 3.87–5.11)
RBC: 2.97 MIL/uL — ABNORMAL LOW (ref 3.87–5.11)
RBC: 3 MIL/uL — ABNORMAL LOW (ref 3.87–5.11)
RBC: 3.07 MIL/uL — ABNORMAL LOW (ref 3.87–5.11)
RBC: 3.13 MIL/uL — ABNORMAL LOW (ref 3.87–5.11)
RBC: 3.14 MIL/uL — ABNORMAL LOW (ref 3.87–5.11)
RBC: 3.17 MIL/uL — ABNORMAL LOW (ref 3.87–5.11)
RDW: 14.9 % (ref 11.5–15.5)
RDW: 15 % (ref 11.5–15.5)
RDW: 15.1 % (ref 11.5–15.5)
RDW: 15.2 % (ref 11.5–15.5)
RDW: 15.3 % (ref 11.5–15.5)
RDW: 15.4 % (ref 11.5–15.5)
RDW: 15.4 % (ref 11.5–15.5)
RDW: 15.4 % (ref 11.5–15.5)
RDW: 15.5 % (ref 11.5–15.5)
RDW: 15.6 % — ABNORMAL HIGH (ref 11.5–15.5)
RDW: 15.7 % — ABNORMAL HIGH (ref 11.5–15.5)
RDW: 15.8 % — ABNORMAL HIGH (ref 11.5–15.5)
RDW: 15.9 % — ABNORMAL HIGH (ref 11.5–15.5)
RDW: 16 % — ABNORMAL HIGH (ref 11.5–15.5)
RDW: 16.1 % — ABNORMAL HIGH (ref 11.5–15.5)
RDW: 16.4 % — ABNORMAL HIGH (ref 11.5–15.5)
RDW: 16.5 % — ABNORMAL HIGH (ref 11.5–15.5)
RDW: 16.6 % — ABNORMAL HIGH (ref 11.5–15.5)
RDW: 16.7 % — ABNORMAL HIGH (ref 11.5–15.5)
RDW: 16.7 % — ABNORMAL HIGH (ref 11.5–15.5)
WBC: 10.2 10*3/uL (ref 4.0–10.5)
WBC: 10.3 10*3/uL (ref 4.0–10.5)
WBC: 11 10*3/uL — ABNORMAL HIGH (ref 4.0–10.5)
WBC: 11.2 10*3/uL — ABNORMAL HIGH (ref 4.0–10.5)
WBC: 11.3 10*3/uL — ABNORMAL HIGH (ref 4.0–10.5)
WBC: 11.6 10*3/uL — ABNORMAL HIGH (ref 4.0–10.5)
WBC: 14 10*3/uL — ABNORMAL HIGH (ref 4.0–10.5)
WBC: 6.5 10*3/uL (ref 4.0–10.5)
WBC: 7.1 10*3/uL (ref 4.0–10.5)
WBC: 7.2 10*3/uL (ref 4.0–10.5)
WBC: 7.9 10*3/uL (ref 4.0–10.5)
WBC: 8.2 10*3/uL (ref 4.0–10.5)
WBC: 8.3 10*3/uL (ref 4.0–10.5)
WBC: 8.4 10*3/uL (ref 4.0–10.5)
WBC: 8.6 10*3/uL (ref 4.0–10.5)
WBC: 8.7 10*3/uL (ref 4.0–10.5)
WBC: 8.8 10*3/uL (ref 4.0–10.5)
WBC: 9.1 10*3/uL (ref 4.0–10.5)
WBC: 9.2 10*3/uL (ref 4.0–10.5)

## 2010-11-09 LAB — GLUCOSE, CAPILLARY
Glucose-Capillary: 107 mg/dL — ABNORMAL HIGH (ref 70–99)
Glucose-Capillary: 109 mg/dL — ABNORMAL HIGH (ref 70–99)
Glucose-Capillary: 112 mg/dL — ABNORMAL HIGH (ref 70–99)
Glucose-Capillary: 114 mg/dL — ABNORMAL HIGH (ref 70–99)
Glucose-Capillary: 114 mg/dL — ABNORMAL HIGH (ref 70–99)
Glucose-Capillary: 114 mg/dL — ABNORMAL HIGH (ref 70–99)
Glucose-Capillary: 118 mg/dL — ABNORMAL HIGH (ref 70–99)
Glucose-Capillary: 124 mg/dL — ABNORMAL HIGH (ref 70–99)
Glucose-Capillary: 125 mg/dL — ABNORMAL HIGH (ref 70–99)
Glucose-Capillary: 129 mg/dL — ABNORMAL HIGH (ref 70–99)
Glucose-Capillary: 136 mg/dL — ABNORMAL HIGH (ref 70–99)
Glucose-Capillary: 161 mg/dL — ABNORMAL HIGH (ref 70–99)
Glucose-Capillary: 178 mg/dL — ABNORMAL HIGH (ref 70–99)
Glucose-Capillary: 58 mg/dL — ABNORMAL LOW (ref 70–99)
Glucose-Capillary: 73 mg/dL (ref 70–99)
Glucose-Capillary: 91 mg/dL (ref 70–99)
Glucose-Capillary: 94 mg/dL (ref 70–99)
Glucose-Capillary: 96 mg/dL (ref 70–99)

## 2010-11-09 LAB — WOUND CULTURE: Culture: NO GROWTH

## 2010-11-09 LAB — TSH
TSH: 13.757 u[IU]/mL — ABNORMAL HIGH (ref 0.350–4.500)
TSH: 4.006 u[IU]/mL (ref 0.350–4.500)

## 2010-11-09 LAB — HEMOCCULT GUIAC POC 1CARD (OFFICE): Fecal Occult Bld: NEGATIVE

## 2010-11-09 LAB — URINALYSIS, ROUTINE W REFLEX MICROSCOPIC
Protein, ur: 30 mg/dL — AB
Urobilinogen, UA: 0.2 mg/dL (ref 0.0–1.0)

## 2010-11-09 LAB — CARDIAC PANEL(CRET KIN+CKTOT+MB+TROPI)
CK, MB: 1.9 ng/mL (ref 0.3–4.0)
Relative Index: INVALID (ref 0.0–2.5)
Relative Index: INVALID (ref 0.0–2.5)
Total CK: 24 U/L (ref 7–177)
Total CK: 25 U/L (ref 7–177)
Total CK: 29 U/L (ref 7–177)
Troponin I: 0.06 ng/mL (ref 0.00–0.06)

## 2010-11-09 LAB — COMPREHENSIVE METABOLIC PANEL
ALT: 21 U/L (ref 0–35)
AST: 35 U/L (ref 0–37)
Albumin: 2.8 g/dL — ABNORMAL LOW (ref 3.5–5.2)
Alkaline Phosphatase: 54 U/L (ref 39–117)
Alkaline Phosphatase: 97 U/L (ref 39–117)
BUN: 18 mg/dL (ref 6–23)
BUN: 39 mg/dL — ABNORMAL HIGH (ref 6–23)
CO2: 32 mEq/L (ref 19–32)
Calcium: 8.3 mg/dL — ABNORMAL LOW (ref 8.4–10.5)
Chloride: 101 mEq/L (ref 96–112)
Chloride: 101 mEq/L (ref 96–112)
Creatinine, Ser: 1.03 mg/dL (ref 0.4–1.2)
Creatinine, Ser: 1.33 mg/dL — ABNORMAL HIGH (ref 0.4–1.2)
GFR calc Af Amer: 46 mL/min — ABNORMAL LOW (ref >60)
GFR calc non Af Amer: 38 mL/min — ABNORMAL LOW (ref >60)
Glucose, Bld: 115 mg/dL — ABNORMAL HIGH (ref 70–99)
Glucose, Bld: 99 mg/dL (ref 70–99)
Potassium: 4.2 mEq/L (ref 3.5–5.1)
Potassium: 4.2 mEq/L (ref 3.5–5.1)
Sodium: 138 mEq/L (ref 135–145)
Total Bilirubin: 1.2 mg/dL (ref 0.3–1.2)
Total Bilirubin: 3 mg/dL — ABNORMAL HIGH (ref 0.3–1.2)
Total Protein: 4.8 g/dL — ABNORMAL LOW (ref 6.0–8.3)

## 2010-11-09 LAB — URINE CULTURE
Colony Count: >=100000
Culture  Setup Time: 201111191058
Special Requests: POSITIVE

## 2010-11-09 LAB — HEMOGLOBIN AND HEMATOCRIT, BLOOD
HCT: 27.3 % — ABNORMAL LOW (ref 36.0–46.0)
Hemoglobin: 8.8 g/dL — ABNORMAL LOW (ref 12.0–15.0)

## 2010-11-09 LAB — DIFFERENTIAL
Basophils Absolute: 0 10*3/uL (ref 0.0–0.1)
Basophils Absolute: 0 10*3/uL (ref 0.0–0.1)
Basophils Relative: 0 % (ref 0–1)
Eosinophils Relative: 1 % (ref 0–5)
Lymphocytes Relative: 15 % (ref 12–46)
Lymphocytes Relative: 16 % (ref 12–46)
Lymphs Abs: 1.1 10*3/uL (ref 0.7–4.0)
Monocytes Absolute: 0.6 10*3/uL (ref 0.1–1.0)
Neutro Abs: 5.2 10*3/uL (ref 1.7–7.7)
Neutrophils Relative %: 74 % (ref 43–77)

## 2010-11-09 LAB — URINE MICROSCOPIC-ADD ON

## 2010-11-09 LAB — MRSA PCR SCREENING: MRSA by PCR: NEGATIVE

## 2010-11-09 LAB — THYROID ANTIBODIES
Thyroglobulin Ab: <20 U/mL (ref <40.0)
Thyroperoxidase Ab SerPl-aCnc: <10 U/mL (ref <35.0)

## 2010-11-09 LAB — BRAIN NATRIURETIC PEPTIDE: Pro B Natriuretic peptide (BNP): 1476 pg/mL — ABNORMAL HIGH (ref 0.0–100.0)

## 2010-11-09 LAB — MAGNESIUM: Magnesium: 2.1 mg/dL (ref 1.5–2.5)

## 2010-11-10 ENCOUNTER — Ambulatory Visit (INDEPENDENT_AMBULATORY_CARE_PROVIDER_SITE_OTHER): Payer: Medicare Other | Admitting: Internal Medicine

## 2010-11-10 ENCOUNTER — Encounter: Payer: Self-pay | Admitting: Internal Medicine

## 2010-11-10 DIAGNOSIS — I471 Supraventricular tachycardia: Secondary | ICD-10-CM

## 2010-11-10 LAB — CBC
HCT: 10.3 % — ABNORMAL LOW (ref 36.0–46.0)
HCT: 25 % — ABNORMAL LOW (ref 36.0–46.0)
HCT: 27.5 % — ABNORMAL LOW (ref 36.0–46.0)
HCT: 27.9 % — ABNORMAL LOW (ref 36.0–46.0)
HCT: 28.4 % — ABNORMAL LOW (ref 36.0–46.0)
HCT: 28.6 % — ABNORMAL LOW (ref 36.0–46.0)
HCT: 29.2 % — ABNORMAL LOW (ref 36.0–46.0)
HCT: 29.2 % — ABNORMAL LOW (ref 36.0–46.0)
HCT: 36.3 % (ref 36.0–46.0)
Hemoglobin: 10.2 g/dL — ABNORMAL LOW (ref 12.0–15.0)
Hemoglobin: 8.8 g/dL — ABNORMAL LOW (ref 12.0–15.0)
Hemoglobin: 9.6 g/dL — ABNORMAL LOW (ref 12.0–15.0)
Hemoglobin: 9.6 g/dL — ABNORMAL LOW (ref 12.0–15.0)
Hemoglobin: 9.8 g/dL — ABNORMAL LOW (ref 12.0–15.0)
Hemoglobin: 9.9 g/dL — ABNORMAL LOW (ref 12.0–15.0)
Hemoglobin: 9.9 g/dL — ABNORMAL LOW (ref 12.0–15.0)
MCH: 30.2 pg (ref 26.0–34.0)
MCH: 30.7 pg (ref 26.0–34.0)
MCH: 30.7 pg (ref 26.0–34.0)
MCH: 30.7 pg (ref 26.0–34.0)
MCH: 31 pg (ref 26.0–34.0)
MCH: 31.2 pg (ref 26.0–34.0)
MCH: 31.3 pg (ref 26.0–34.0)
MCH: 31.4 pg (ref 26.0–34.0)
MCH: 31.7 pg (ref 26.0–34.0)
MCHC: 32.5 g/dL (ref 30.0–36.0)
MCHC: 33 g/dL (ref 30.0–36.0)
MCHC: 33.6 g/dL (ref 30.0–36.0)
MCHC: 34.4 g/dL (ref 30.0–36.0)
MCHC: 34.6 g/dL (ref 30.0–36.0)
MCHC: 34.9 g/dL (ref 30.0–36.0)
MCHC: 34.9 g/dL (ref 30.0–36.0)
MCHC: 34.9 g/dL (ref 30.0–36.0)
MCHC: 35.2 g/dL (ref 30.0–36.0)
MCV: 87.1 fL (ref 78.0–100.0)
MCV: 87.2 fL (ref 78.0–100.0)
MCV: 87.9 fL (ref 78.0–100.0)
MCV: 89.6 fL (ref 78.0–100.0)
MCV: 89.6 fL (ref 78.0–100.0)
MCV: 90 fL (ref 78.0–100.0)
MCV: 91.5 fL (ref 78.0–100.0)
MCV: 92.8 fL (ref 78.0–100.0)
MCV: 94.6 fL (ref 78.0–100.0)
Platelets: 131 10*3/uL — ABNORMAL LOW (ref 150–400)
Platelets: 40 10*3/uL — ABNORMAL LOW (ref 150–400)
Platelets: 51 10*3/uL — ABNORMAL LOW (ref 150–400)
Platelets: 53 10*3/uL — ABNORMAL LOW (ref 150–400)
Platelets: 59 10*3/uL — ABNORMAL LOW (ref 150–400)
Platelets: 63 10*3/uL — ABNORMAL LOW (ref 150–400)
Platelets: 70 10*3/uL — ABNORMAL LOW (ref 150–400)
Platelets: 89 10*3/uL — ABNORMAL LOW (ref 150–400)
Platelets: 91 10*3/uL — ABNORMAL LOW (ref 150–400)
RBC: 2.87 MIL/uL — ABNORMAL LOW (ref 3.87–5.11)
RBC: 3.1 MIL/uL — ABNORMAL LOW (ref 3.87–5.11)
RBC: 3.13 MIL/uL — ABNORMAL LOW (ref 3.87–5.11)
RBC: 3.17 MIL/uL — ABNORMAL LOW (ref 3.87–5.11)
RBC: 3.19 MIL/uL — ABNORMAL LOW (ref 3.87–5.11)
RBC: 3.26 MIL/uL — ABNORMAL LOW (ref 3.87–5.11)
RBC: 3.28 MIL/uL — ABNORMAL LOW (ref 3.87–5.11)
RBC: 4.24 MIL/uL (ref 3.87–5.11)
RDW: 14.6 % (ref 11.5–15.5)
RDW: 14.9 % (ref 11.5–15.5)
RDW: 15.5 % (ref 11.5–15.5)
RDW: 15.8 % — ABNORMAL HIGH (ref 11.5–15.5)
RDW: 15.8 % — ABNORMAL HIGH (ref 11.5–15.5)
RDW: 15.9 % — ABNORMAL HIGH (ref 11.5–15.5)
RDW: 16 % — ABNORMAL HIGH (ref 11.5–15.5)
RDW: 16.1 % — ABNORMAL HIGH (ref 11.5–15.5)
RDW: 16.1 % — ABNORMAL HIGH (ref 11.5–15.5)
RDW: 17.4 % — ABNORMAL HIGH (ref 11.5–15.5)
WBC: 10.7 10*3/uL — ABNORMAL HIGH (ref 4.0–10.5)
WBC: 12.5 10*3/uL — ABNORMAL HIGH (ref 4.0–10.5)
WBC: 13.1 10*3/uL — ABNORMAL HIGH (ref 4.0–10.5)
WBC: 14.1 10*3/uL — ABNORMAL HIGH (ref 4.0–10.5)
WBC: 7.9 10*3/uL (ref 4.0–10.5)
WBC: 8.2 10*3/uL (ref 4.0–10.5)
WBC: 8.6 10*3/uL (ref 4.0–10.5)
WBC: 9.2 10*3/uL (ref 4.0–10.5)

## 2010-11-10 LAB — GLUCOSE, CAPILLARY
Glucose-Capillary: 111 mg/dL — ABNORMAL HIGH (ref 70–99)
Glucose-Capillary: 113 mg/dL — ABNORMAL HIGH (ref 70–99)
Glucose-Capillary: 113 mg/dL — ABNORMAL HIGH (ref 70–99)
Glucose-Capillary: 115 mg/dL — ABNORMAL HIGH (ref 70–99)
Glucose-Capillary: 128 mg/dL — ABNORMAL HIGH (ref 70–99)
Glucose-Capillary: 130 mg/dL — ABNORMAL HIGH (ref 70–99)
Glucose-Capillary: 138 mg/dL — ABNORMAL HIGH (ref 70–99)
Glucose-Capillary: 150 mg/dL — ABNORMAL HIGH (ref 70–99)
Glucose-Capillary: 93 mg/dL (ref 70–99)

## 2010-11-10 LAB — POCT I-STAT 3, ART BLOOD GAS (G3+)
Acid-Base Excess: 1 mmol/L (ref 0.0–2.0)
Acid-Base Excess: 5 mmol/L — ABNORMAL HIGH (ref 0.0–2.0)
Acid-base deficit: 3 mmol/L — ABNORMAL HIGH (ref 0.0–2.0)
Acid-base deficit: 8 mmol/L — ABNORMAL HIGH (ref 0.0–2.0)
Bicarbonate: 17.9 mEq/L — ABNORMAL LOW (ref 20.0–24.0)
O2 Saturation: 100 %
O2 Saturation: 100 %
O2 Saturation: 92 %
O2 Saturation: 98 %
O2 Saturation: 99 %
Patient temperature: 37.1
TCO2: 19 mmol/L (ref 0–100)
TCO2: 22 mmol/L (ref 0–100)
TCO2: 23 mmol/L (ref 0–100)
TCO2: 27 mmol/L (ref 0–100)
pCO2 arterial: 34.6 mmHg — ABNORMAL LOW (ref 35.0–45.0)
pCO2 arterial: 37.4 mmHg (ref 35.0–45.0)
pCO2 arterial: 41.6 mmHg (ref 35.0–45.0)
pCO2 arterial: 41.7 mmHg (ref 35.0–45.0)
pCO2 arterial: 49.8 mmHg — ABNORMAL HIGH (ref 35.0–45.0)
pH, Arterial: 7.235 — ABNORMAL LOW (ref 7.350–7.400)
pH, Arterial: 7.292 — ABNORMAL LOW (ref 7.350–7.400)
pH, Arterial: 7.373 (ref 7.350–7.400)
pH, Arterial: 7.377 (ref 7.350–7.400)
pH, Arterial: 7.377 (ref 7.350–7.400)
pO2, Arterial: 131 mmHg — ABNORMAL HIGH (ref 80.0–100.0)
pO2, Arterial: 325 mmHg — ABNORMAL HIGH (ref 80.0–100.0)
pO2, Arterial: 504 mmHg — ABNORMAL HIGH (ref 80.0–100.0)
pO2, Arterial: 77 mmHg — ABNORMAL LOW (ref 80.0–100.0)

## 2010-11-10 LAB — CROSSMATCH
Unit division: 0
Unit division: 0
Unit division: 0
Unit division: 0
Unit division: 0
Unit division: 0

## 2010-11-10 LAB — PREPARE PLATELETS
Unit division: 0
Unit division: 0
Unit division: 0

## 2010-11-10 LAB — CK TOTAL AND CKMB (NOT AT ARMC)
CK, MB: 1.2 ng/mL (ref 0.3–4.0)
CK, MB: 9.6 ng/mL (ref 0.3–4.0)
Relative Index: 1.9 (ref 0.0–2.5)
Relative Index: INVALID (ref 0.0–2.5)
Total CK: 499 U/L — ABNORMAL HIGH (ref 7–177)

## 2010-11-10 LAB — PREPARE FRESH FROZEN PLASMA
Unit division: 0
Unit division: 0

## 2010-11-10 LAB — BASIC METABOLIC PANEL
BUN: 17 mg/dL (ref 6–23)
BUN: 22 mg/dL (ref 6–23)
BUN: 28 mg/dL — ABNORMAL HIGH (ref 6–23)
BUN: 34 mg/dL — ABNORMAL HIGH (ref 6–23)
BUN: 38 mg/dL — ABNORMAL HIGH (ref 6–23)
BUN: 40 mg/dL — ABNORMAL HIGH (ref 6–23)
CO2: 25 mEq/L (ref 19–32)
CO2: 25 mEq/L (ref 19–32)
CO2: 26 mEq/L (ref 19–32)
CO2: 26 mEq/L (ref 19–32)
CO2: 27 mEq/L (ref 19–32)
CO2: 30 mEq/L (ref 19–32)
Calcium: 7.6 mg/dL — ABNORMAL LOW (ref 8.4–10.5)
Calcium: 7.6 mg/dL — ABNORMAL LOW (ref 8.4–10.5)
Calcium: 7.7 mg/dL — ABNORMAL LOW (ref 8.4–10.5)
Calcium: 8.1 mg/dL — ABNORMAL LOW (ref 8.4–10.5)
Calcium: 8.2 mg/dL — ABNORMAL LOW (ref 8.4–10.5)
Calcium: 8.3 mg/dL — ABNORMAL LOW (ref 8.4–10.5)
Chloride: 105 mEq/L (ref 96–112)
Chloride: 106 mEq/L (ref 96–112)
Chloride: 109 mEq/L (ref 96–112)
Chloride: 109 mEq/L (ref 96–112)
Chloride: 110 mEq/L (ref 96–112)
Chloride: 99 mEq/L (ref 96–112)
Creatinine, Ser: 1.05 mg/dL (ref 0.4–1.2)
Creatinine, Ser: 1.25 mg/dL — ABNORMAL HIGH (ref 0.4–1.2)
Creatinine, Ser: 1.44 mg/dL — ABNORMAL HIGH (ref 0.4–1.2)
Creatinine, Ser: 1.45 mg/dL — ABNORMAL HIGH (ref 0.4–1.2)
Creatinine, Ser: 1.59 mg/dL — ABNORMAL HIGH (ref 0.4–1.2)
Creatinine, Ser: 1.61 mg/dL — ABNORMAL HIGH (ref 0.4–1.2)
GFR calc Af Amer: 37 mL/min — ABNORMAL LOW (ref >60)
GFR calc Af Amer: 38 mL/min — ABNORMAL LOW (ref >60)
GFR calc Af Amer: 42 mL/min — ABNORMAL LOW (ref >60)
GFR calc Af Amer: 42 mL/min — ABNORMAL LOW (ref >60)
GFR calc Af Amer: 50 mL/min — ABNORMAL LOW (ref >60)
GFR calc Af Amer: >60 mL/min (ref >60)
GFR calc non Af Amer: 31 mL/min — ABNORMAL LOW (ref >60)
GFR calc non Af Amer: 31 mL/min — ABNORMAL LOW (ref >60)
GFR calc non Af Amer: 35 mL/min — ABNORMAL LOW (ref >60)
GFR calc non Af Amer: 35 mL/min — ABNORMAL LOW (ref >60)
GFR calc non Af Amer: 41 mL/min — ABNORMAL LOW (ref >60)
GFR calc non Af Amer: 50 mL/min — ABNORMAL LOW (ref >60)
Glucose, Bld: 102 mg/dL — ABNORMAL HIGH (ref 70–99)
Glucose, Bld: 131 mg/dL — ABNORMAL HIGH (ref 70–99)
Glucose, Bld: 131 mg/dL — ABNORMAL HIGH (ref 70–99)
Glucose, Bld: 137 mg/dL — ABNORMAL HIGH (ref 70–99)
Glucose, Bld: 143 mg/dL — ABNORMAL HIGH (ref 70–99)
Glucose, Bld: 155 mg/dL — ABNORMAL HIGH (ref 70–99)
Potassium: 3.6 mEq/L (ref 3.5–5.1)
Potassium: 3.8 mEq/L (ref 3.5–5.1)
Potassium: 3.8 mEq/L (ref 3.5–5.1)
Potassium: 4 mEq/L (ref 3.5–5.1)
Potassium: 4 mEq/L (ref 3.5–5.1)
Potassium: 4.2 mEq/L (ref 3.5–5.1)
Sodium: 138 mEq/L (ref 135–145)
Sodium: 140 mEq/L (ref 135–145)
Sodium: 141 mEq/L (ref 135–145)
Sodium: 142 mEq/L (ref 135–145)
Sodium: 142 mEq/L (ref 135–145)
Sodium: 144 mEq/L (ref 135–145)

## 2010-11-10 LAB — CARDIAC PANEL(CRET KIN+CKTOT+MB+TROPI)
CK, MB: 1.5 ng/mL (ref 0.3–4.0)
CK, MB: 2.2 ng/mL (ref 0.3–4.0)
CK, MB: 9.8 ng/mL (ref 0.3–4.0)
Relative Index: 2.8 — ABNORMAL HIGH (ref 0.0–2.5)
Relative Index: INVALID (ref 0.0–2.5)
Total CK: 351 U/L — ABNORMAL HIGH (ref 7–177)
Total CK: 43 U/L (ref 7–177)
Troponin I: 0.01 ng/mL (ref 0.00–0.06)
Troponin I: 0.02 ng/mL (ref 0.00–0.06)
Troponin I: 1.28 ng/mL (ref 0.00–0.06)

## 2010-11-10 LAB — POCT CARDIAC MARKERS
CKMB, poc: <1 ng/mL — ABNORMAL LOW (ref 1.0–8.0)
Myoglobin, poc: 43.5 ng/mL (ref 12–200)
Troponin i, poc: <0.05 ng/mL (ref 0.00–0.09)
Troponin i, poc: <0.05 ng/mL (ref 0.00–0.09)

## 2010-11-10 LAB — POCT I-STAT 4, (NA,K, GLUC, HGB,HCT)
Glucose, Bld: 139 mg/dL — ABNORMAL HIGH (ref 70–99)
Glucose, Bld: 159 mg/dL — ABNORMAL HIGH (ref 70–99)
Glucose, Bld: 160 mg/dL — ABNORMAL HIGH (ref 70–99)
Glucose, Bld: 162 mg/dL — ABNORMAL HIGH (ref 70–99)
Glucose, Bld: 176 mg/dL — ABNORMAL HIGH (ref 70–99)
Glucose, Bld: 177 mg/dL — ABNORMAL HIGH (ref 70–99)
Glucose, Bld: 182 mg/dL — ABNORMAL HIGH (ref 70–99)
Glucose, Bld: 190 mg/dL — ABNORMAL HIGH (ref 70–99)
Glucose, Bld: 225 mg/dL — ABNORMAL HIGH (ref 70–99)
Glucose, Bld: 226 mg/dL — ABNORMAL HIGH (ref 70–99)
HCT: 12 % — ABNORMAL LOW (ref 36.0–46.0)
HCT: 18 % — ABNORMAL LOW (ref 36.0–46.0)
HCT: 19 % — ABNORMAL LOW (ref 36.0–46.0)
HCT: 23 % — ABNORMAL LOW (ref 36.0–46.0)
HCT: 24 % — ABNORMAL LOW (ref 36.0–46.0)
HCT: 26 % — ABNORMAL LOW (ref 36.0–46.0)
HCT: 29 % — ABNORMAL LOW (ref 36.0–46.0)
HCT: 31 % — ABNORMAL LOW (ref 36.0–46.0)
Hemoglobin: 10.5 g/dL — ABNORMAL LOW (ref 12.0–15.0)
Hemoglobin: 4.1 g/dL — CL (ref 12.0–15.0)
Hemoglobin: 6.1 g/dL — CL (ref 12.0–15.0)
Hemoglobin: 7.8 g/dL — ABNORMAL LOW (ref 12.0–15.0)
Hemoglobin: 8.8 g/dL — ABNORMAL LOW (ref 12.0–15.0)
Potassium: 3.2 mEq/L — ABNORMAL LOW (ref 3.5–5.1)
Potassium: 3.7 mEq/L (ref 3.5–5.1)
Potassium: 3.9 mEq/L (ref 3.5–5.1)
Potassium: 4.5 mEq/L (ref 3.5–5.1)
Potassium: 4.5 mEq/L (ref 3.5–5.1)
Potassium: 4.6 mEq/L (ref 3.5–5.1)
Potassium: 4.6 mEq/L (ref 3.5–5.1)
Sodium: 132 mEq/L — ABNORMAL LOW (ref 135–145)
Sodium: 138 mEq/L (ref 135–145)
Sodium: 139 mEq/L (ref 135–145)
Sodium: 140 mEq/L (ref 135–145)
Sodium: 145 mEq/L (ref 135–145)
Sodium: 149 mEq/L — ABNORMAL HIGH (ref 135–145)

## 2010-11-10 LAB — PREPARE CRYOPRECIPITATE

## 2010-11-10 LAB — PLATELET COUNT: Platelets: 35 10*3/uL — ABNORMAL LOW (ref 150–400)

## 2010-11-10 LAB — TYPE AND SCREEN
ABO/RH(D): A POS
Antibody Screen: NEGATIVE

## 2010-11-10 LAB — TROPONIN I
Troponin I: 1.54 ng/mL (ref 0.00–0.06)
Troponin I: <0.01 ng/mL (ref 0.00–0.06)

## 2010-11-10 LAB — DIFFERENTIAL
Basophils Absolute: 0 10*3/uL (ref 0.0–0.1)
Basophils Absolute: 0 10*3/uL (ref 0.0–0.1)
Basophils Relative: 0 % (ref 0–1)
Eosinophils Absolute: 0 10*3/uL (ref 0.0–0.7)
Eosinophils Relative: 0 % (ref 0–5)
Eosinophils Relative: 1 % (ref 0–5)
Lymphocytes Relative: 28 % (ref 12–46)
Lymphocytes Relative: 5 % — ABNORMAL LOW (ref 12–46)
Lymphs Abs: 1.5 10*3/uL (ref 0.7–4.0)
Monocytes Absolute: 0.3 10*3/uL (ref 0.1–1.0)
Monocytes Relative: 6 % (ref 3–12)
Neutro Abs: 3.4 10*3/uL (ref 1.7–7.7)

## 2010-11-10 LAB — AMYLASE: Amylase: 107 U/L — ABNORMAL HIGH (ref 0–105)

## 2010-11-10 LAB — COMPREHENSIVE METABOLIC PANEL
ALT: 22 U/L (ref 0–35)
AST: 30 U/L (ref 0–37)
AST: 48 U/L — ABNORMAL HIGH (ref 0–37)
Albumin: 3.9 g/dL (ref 3.5–5.2)
BUN: 20 mg/dL (ref 6–23)
CO2: 24 mEq/L (ref 19–32)
Calcium: 8.9 mg/dL (ref 8.4–10.5)
Chloride: 104 mEq/L (ref 96–112)
Chloride: 105 mEq/L (ref 96–112)
Creatinine, Ser: 1.12 mg/dL (ref 0.4–1.2)
Creatinine, Ser: 1.38 mg/dL — ABNORMAL HIGH (ref 0.4–1.2)
GFR calc Af Amer: 44 mL/min — ABNORMAL LOW (ref >60)
GFR calc Af Amer: 56 mL/min — ABNORMAL LOW (ref >60)
GFR calc non Af Amer: 37 mL/min — ABNORMAL LOW (ref >60)
Total Bilirubin: 1.7 mg/dL — ABNORMAL HIGH (ref 0.3–1.2)
Total Bilirubin: 3.5 mg/dL — ABNORMAL HIGH (ref 0.3–1.2)
Total Protein: 6.3 g/dL (ref 6.0–8.3)

## 2010-11-10 LAB — APTT
aPTT: 28 seconds (ref 24–37)
aPTT: 51 seconds — ABNORMAL HIGH (ref 24–37)
aPTT: 72 seconds — ABNORMAL HIGH (ref 24–37)

## 2010-11-10 LAB — PROTIME-INR
INR: 1.19 (ref 0.00–1.49)
Prothrombin Time: 15.3 seconds — ABNORMAL HIGH (ref 11.6–15.2)

## 2010-11-10 LAB — MRSA PCR SCREENING: MRSA by PCR: NEGATIVE

## 2010-11-10 LAB — SAMPLE TO BLOOD BANK

## 2010-11-10 LAB — HEMOGLOBIN AND HEMATOCRIT, BLOOD: HCT: 25.1 % — ABNORMAL LOW (ref 36.0–46.0)

## 2010-11-10 LAB — HEPARIN LEVEL (UNFRACTIONATED): Heparin Unfractionated: 0.26 IU/mL — ABNORMAL LOW (ref 0.30–0.70)

## 2010-11-10 LAB — MAGNESIUM: Magnesium: 2 mg/dL (ref 1.5–2.5)

## 2010-11-12 NOTE — Op Note (Signed)
NAMEJENNIFFER, Yvonne [name]              ACCOUNT NO.:  [id number]  MEDICAL RECORD NO.:  [id number]           PATIENT TYPE:  I  LOCATION:  6527                         FACILITY:  [facility]  PHYSICIAN:  Hillis Range, MD       DATE OF BIRTH:  1925/09/09  DATE OF PROCEDURE: DATE OF DISCHARGE:                              OPERATIVE REPORT   EP PROCEDURE NOTE  SURGEON:  Hillis Range, MD  PREPROCEDURE DIAGNOSIS:  Supraventricular tachycardia.  POSTPROCEDURE DIAGNOSIS:  Classic atrioventricular nodal reentrant tachycardia with antegrade conduction of the slow atrioventricular nodal pathway and retrograde conduction over the fast pathway.  PROCEDURES: 1. Comprehensive EP study. 2. Coronary sinus pacing and recording. 3. Three-D mapping of SVT. 4. Radiofrequency ablation of SVT.  INTRODUCTION:  Yvonne [name] is a pleasant 77 year old female with a history of recurrent symptomatic supraventricular tachycardia, who presents today for EP study and radiofrequency ablation.  She has had longstanding progressive symptoms of tachy palpitations.  She has been previously documented to have a narrow complex tachycardia.  Her tachycardia has previously been documented as terminate with carotid massage.  She has failed medical therapy with diltiazem, amiodarone, and flecainide.  She therefore presents today for EP study and radiofrequency ablation.  DESCRIPTION OF PROCEDURE:  Informed written consent was obtained and the patient was brought to the Electrophysiology Lab in the fasting state. She was adequately sedated with intravenous Versed and fentanyl as outlined in the nursing report.  The patient's right neck and groin were prepped and draped in the usual sterile fashion by the EP lab staff. Using a percutaneous Seldinger technique, one 6-French hemostasis sheath was placed into the right internal jugular vein.  A 6-French curved hexapolar Damato catheter was introduced through the right  internal jugular vein and advanced into the coronary sinus for recording and pacing from this location.  Two 6-French and one 8-French hemostasis sheaths were placed in the right common femoral vein.  Two 6-French quadripolar Josephson catheters were introduced through the right common femoral vein and advanced into the right ventricle and His bundle locations respectively.  The patient presented to the Electrophysiology Lab in normal sinus rhythm.  Her AH interval measured 112 milliseconds with an HV interval of 54 milliseconds.  Her PR interval was 223 milliseconds with a QRS duration of 84 milliseconds and a QT interval of 403 milliseconds.  Ventricular pacing was performed, which revealed midline decremental VA conduction with a VA Wenckebach cycle length of 400 milliseconds.  With pacing the ventricle of 400 milliseconds, the patient was observed to have tachycardia.  This was a narrow complex tachycardia with 1:1 VA conduction in a cycle length of 470 milliseconds.  The earliest retrograde atrial activation was from the His electrogram and the VA time measured 10 milliseconds.  The tachycardia was spontaneously terminated.  Ventricular extrastimulus testing was then performed, which revealed midline concentric decremental VA conduction with tachycardia induced.  This was the same tachycardia with a cycle length of 460 milliseconds and a VA time measuring 8 milliseconds.  Ventricular pacing was performed during tachycardia, which revealed a VAV response.  The tachycardia was terminated.  Ventricular  extrastimulus testing was again attempted and with 500/400 the patient had a single echo beat observed.  With 400/390, the patient had recurrent tachycardia observed.  The patient was observed to have frequent single echo beats during tachycardia and the ventricular ERP was 500/240 milliseconds.  Atrial pacing was then performed, which revealed PR greater than RR with tachycardia  induced, when pacing at 440, the tachycardia was spontaneously terminated.  The VA Wenckebach cycle length was slightly less than 440.  Additional atrial pacing was performed and tachycardia was induced with a basic cycle length of 500 milliseconds and also 450 milliseconds when pacing from the atrium.  The tachycardia was then terminated with atrial pacing.  Atrial pacing was again performed and tachycardia was easily induced.  PVCs were then delivered during his refractoriness, which did not advance nor terminate the tachycardia.  Atrial extrastimulus testing was performed, which revealed decremental AV conduction.  However, the patient had a reproducible AH jump, which was a critical jump leading to tachycardia with 500/410 and also 500/400 milliseconds.  The patient was therefore felt to have classic AV nodal reentrant tachycardia.  I therefore elected to perform slow pathway ablation.  A 7-French Biosense Webster 4-mm ablation catheter was introduced through the right common femoral vein and advanced into the right atrium.  Three-dimensional electroanatomical mapping was performed of Koch's triangle.  This demonstrated a rather standard Koch's triangle.  A His cloud was generated using three-dimensional electroanatomical mapping.  The ablation catheter was then positioned between sites 11 and sites 5 in Koch's triangle.  A series of radiofrequency applications were delivered at 50 watts with a target temperature of 60 degrees each approximately 10 seconds in duration.  During the 10th radiofrequency after application, the patient had very brief accelerated junctional rhythm of only 3 beats.  Due to being at site 4 in Koch's triangle, I terminated radiofrequency current when the catheter slipped upward.  No AV block was observed.  Rapid atrial pacing was then performed, which revealed no evidence of PR greater than RR and the AV Wenckebach cycle length was 470 milliseconds following  ablation.  Ventricular pacing was then performed, which revealed concentric decremental VA conduction with VA Wenckebach cycle length of 430 milliseconds with no arrhythmias observed.  Ventricular extrastimulus testing was performed, which revealed decremental VA conduction with no retrograde jumps, echo beats or tachycardias observed.  The retrograde AV Wenckebach cycle length was 500/370 milliseconds.  No arrhythmias were observed.  Rapid atrial pacing was again performed, which revealed no evidence of PR greater than RR and the AV Wenckebach cycle length remained 470 milliseconds. Atrial extrastimulus testing was performed, which revealed decremental AV conduction with no AH jumps, echo beats or tachycardias observed. The AV nodal ERP was 500/380 milliseconds.  Additional attempts were made to induce tachycardia or to find evidence of dual AV nodal physiology including additional ventricular pacing, ventricular extrastimulus testing, atrial pacing, and atrial extrastimulus testing. There remained no evidence of dual AV nodal physiology and tachycardia could no longer be observed.  The patient was observed for 20 minutes following ablation with no inducible arrhythmias.  The procedure was therefore considered completed.  All catheters were removed and the sheaths were aspirated and flushed.  The sheaths were removed and hemostasis was assured.  There were no early apparent complications.  CONCLUSIONS: 1. Sinus rhythm upon presentation. 2. Easily inducible, reproducible and incessant classic AV nodal     reentrant tachycardia. 3. Dual AV nodal physiology was present, however, there was no  evidence of accessory pathways. 4. Successful ablation of the slow pathway with no inducible     arrhythmias following ablation. 5. No early apparent complications.     Hillis Range, MD     JA/MEDQ  D:  10/19/2010  T:  10/20/2010  Job:  [id number]  cc:   Thurmon Fair,  MD  Electronically Signed by Hillis Range MD on 11/12/2010 10:52:28 PM

## 2010-11-12 NOTE — Discharge Summary (Signed)
Yvonne [name], Yvonne [name]              ACCOUNT NO.:  [id number]  MEDICAL RECORD NO.:  [id number]           PATIENT TYPE:  I  LOCATION:  6527                         FACILITY:  [facility]  PHYSICIAN:  Hillis Range, MD       DATE OF BIRTH:  11/30/1925  DATE OF ADMISSION:  10/19/2010 DATE OF DISCHARGE:  10/20/2010                              DISCHARGE SUMMARY   PRIMARY CARE PHYSICIAN:  Veverly Fells. Altheimer, MD.  PRIMARY CARDIOLOGIST:  Thurmon Fair, MD  ELECTROPHYSIOLOGIST:  Hillis Range, MD  PRIMARY DIAGNOSIS:  Supraventricular tachycardia.  SECONDARY DIAGNOSES: 1. Vascular disease with ruptured ascending aortic aneurysm requiring     urgent repair in October 2011 by Dr. Tyrone Sage. 2. Treated hypothyroidism. 3. Depression. 4. Hypertension. 5. Degenerative disk disease.  ALLERGIES:  The patient has no known drug allergies.  PROCEDURES THIS ADMISSION:  Electrophysiology study and radiofrequency catheter ablation of supraventricular tachycardia by Dr. Johney Frame on October 19, 2010.  The study demonstrated easily inducible, reproducible classic AVNRT.  The patient had successful ablation of a slow pathway with no inducible arrhythmias following ablation.  The patient had no early apparent complications.  BRIEF HISTORY OF PRESENT ILLNESS:  Ms. Advincula is an 77 year old female with a history of symptomatic supraventricular tachycardia.  This in the past was terminated with carotid massage.  She was placed on flecainide for rhythm control.  She was evaluated in the outpatient setting by Dr. Johney Frame.  Therapeutic strategies for supraventricular tachycardia including antiarrhythmic medications and ablation were discussed with the patient.  Risks, benefits, and alternatives of ablation were reviewed with the patient and wished to proceed.  HOSPITAL COURSE:  The patient was admitted on October 19, 2010, for planned ablation of SVT.  This was carried out by Dr. Johney Frame with details as  outlined above.  She was monitored on telemetry overnight which demonstrated long RP tachycardia that was asymptomatic intermittently. Dr. Johney Frame felt this may be a different mechanism of tachycardia from her previous SVT since her previous SVT was so symptomatic. Recommendations per Dr. Johney Frame at the time of discharge were to discontinue the patient's flecainide and continue Toprol and to follow up within 4 weeks.  The patient's groin and neck incision were without hematoma or bruit.  Dr. Johney Frame felt the patient was stable for discharge.  FOLLOWUP APPOINTMENTS: 1. Dr. Johney Frame on November 10, 2010, at 11:25 a.m. 2. Dr. Royann Shivers as scheduled. 3. Dr. Leslie Dales as scheduled.  DISCHARGE INSTRUCTIONS: 1. Increase activity slowly. 2. No driving for 2 days. 3. Follow low-sodium heart-healthy diet. 4. Keep incisions clean and dry.  DISCHARGE MEDICATIONS: 1. Aspirin 325 mg daily. 2. Tylenol 325 mg as needed. 3. Calcium/vitamin D twice daily. 4. Folic acid 1 mg daily. 5. Levothyroxine 50 mcg daily. 6. Nu-Iron 150 mg daily. 7. Toprol-XL 50 mg daily. 8. Vitamin D3 daily.  Of note, the patient's flecainide was discontinued this admission.  DISPOSITION:  The patient was seen and examined by Dr. Johney Frame on October 20, 2010, and considered stable for discharge.  DURATION OF DISCHARGE ENCOUNTER:  Thirty-five minutes.     Gypsy Balsam, RN,BSN   ______________________________ Hillis Range, MD  AS/MEDQ  D:  10/20/2010  T:  10/20/2010  Job:  [id number]  cc:   Veverly Fells. Altheimer, M.D. Thurmon Fair, MD  Electronically Signed by Gypsy Balsam RNBSN on 11/02/2010 09:27:18 AM Electronically Signed by Hillis Range MD on 11/12/2010 10:52:30 PM

## 2010-11-16 NOTE — Assessment & Plan Note (Signed)
Summary: eph. per [email or web address]   Visit Type:  Follow-up   History of Present Illness: The patient presents today for routine electrophysiology followup. She reports doing very well since her SVT ablation.  She has had no further symptomatic arrhythmias off flecainide.  The patient denies symptoms of palpitations, chest pain, shortness of breath,, lower extremity edema, dizziness, presyncope, syncope, or neurologic sequela. The patient is tolerating medications without difficulties and is otherwise without complaint today.   Current Medications (verified): 1)  Synthroid 50 Mcg Tabs (Levothyroxine Sodium) .Marland Kitchen.. 1 Once Daily 2)  Metoprolol Succinate 50 Mg Xr24h-Tab (Metoprolol Succinate) .Marland Kitchen.. 1 Q D 3)  Ecotrin 325 Mg Tbec (Aspirin) .Marland Kitchen.. 1 Once Daily 4)  Calcium/vitamin D/minerals 600-200 Mg-Unit Tabs (Calcium Carbonate-Vit D-Min) .Marland Kitchen.. 1 Two Times A Day 5)  Ferrous Gluconate 324 (38 Fe) Mg Tabs (Ferrous Gluconate) .Marland Kitchen.. 1 Once Daily 6)  Folic Acid 1 Mg Tabs (Folic Acid) .Marland Kitchen.. 1 Once Daily  Allergies (verified): No Known Drug Allergies  Past History:  Past Medical History:  1. 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 Low-risk Myoview in September 2011 with normal left ventricular function.   3. Vascular disease with a ruptured ascending aortic aneurysm  requiring urgent surgeries on June 25, 2010, by Dr. Tyrone Sage.  5. Right groin lymphocele, drained this admission by Cardiovascular- Thoracic Surgery, culture negative.  6. Treated hypothyroidism.  7. Depression.  8. HTN 9. DDD  Past Surgical History:  L2  kyphoplasty    s/p APPY 1951  s/p L hip replacement 1995  Ruptured aortic aneurysm repair 10/11  Bil wrist Fx 1994  hernia repair  Slow pathway ablation for AVNRT 2/12  Vital Signs:  Patient profile:   77 year old female Height:      67 inches Weight:      119 pounds BMI:     18.71 Pulse rate:   74 / minute BP sitting:   110 / 60  (left arm)  Vitals Entered By: Laurance Flatten CMA (November 10, 2010 11:29 AM)  Physical Exam  General:  thin elderly remale, NAD Head:  normocephalic and atraumatic Eyes:  PERRLA/EOM intact; conjunctiva and lids normal. Mouth:  Teeth, gums and palate normal. Oral mucosa normal. Neck:  supple Lungs:  Clear bilaterally to auscultation and percussion. Heart:  RRR, no m/r/g Abdomen:  Bowel sounds positive; abdomen soft and non-tender without masses, organomegaly, or hernias noted. No hepatosplenomegaly. Msk:  Back normal, normal gait. Muscle strength and tone normal. Extremities:  No clubbing or cyanosis.    Neurologic:  Alert and oriented x 3. Skin:  Intact without lesions or rashes.   EKG  Procedure date:  11/10/2010  Findings:      sinus rhythm 77 bpm, LVH with repolarization abnormality single PVC  Impression & Recommendations:  Problem # 1:  SUPRAVENTRICULAR TACHYCARDIA (ICD-427.89) doing very well s/p slow pathway ablation for AVNRT she did have a short run of an atrial tachycardia postoperative.  This was not inducible at EP study but does not appear to be a clinical issue at this time.  I would keep her on a low dose of metoprolol long term.  She will continue to follow with Dr C and I will see her as needed.  Problem # 2:  HYPERTENSION (ICD-401.9) controlled no changes  Other Orders: EKG w/ Interpretation (93000)  Patient Instructions: 1)  Your physician recommends that you schedule a follow-up appointment AS NEEDED 2)  Your physician recommends that you continue on your current medications as directed.  Please refer to the Current Medication list given to you today.

## 2010-11-17 ENCOUNTER — Other Ambulatory Visit: Payer: Self-pay | Admitting: Orthopedic Surgery

## 2010-11-17 ENCOUNTER — Encounter (HOSPITAL_COMMUNITY): Payer: Medicare Other

## 2010-11-17 ENCOUNTER — Other Ambulatory Visit (HOSPITAL_COMMUNITY): Payer: Self-pay | Admitting: Orthopedic Surgery

## 2010-11-17 ENCOUNTER — Ambulatory Visit (HOSPITAL_COMMUNITY)
Admission: RE | Admit: 2010-11-17 | Discharge: 2010-11-17 | Disposition: A | Discharge status: Home / Self Care | Payer: Medicare Other | Source: Ambulatory Visit | Attending: Orthopedic Surgery | Admitting: Orthopedic Surgery

## 2010-11-17 DIAGNOSIS — Z01812 Encounter for preprocedural laboratory examination: Secondary | ICD-10-CM | POA: Insufficient documentation

## 2010-11-17 DIAGNOSIS — Z01818 Encounter for other preprocedural examination: Secondary | ICD-10-CM | POA: Insufficient documentation

## 2010-11-17 DIAGNOSIS — J438 Other emphysema: Secondary | ICD-10-CM | POA: Insufficient documentation

## 2010-11-17 DIAGNOSIS — M169 Osteoarthritis of hip, unspecified: Secondary | ICD-10-CM

## 2010-11-17 LAB — BASIC METABOLIC PANEL
BUN: 21 mg/dL (ref 6–23)
CO2: 30 mEq/L (ref 19–32)
Chloride: 101 mEq/L (ref 96–112)
GFR calc non Af Amer: 52 mL/min — ABNORMAL LOW (ref >60)
Glucose, Bld: 96 mg/dL (ref 70–99)
Potassium: 4.4 mEq/L (ref 3.5–5.1)
Sodium: 140 mEq/L (ref 135–145)

## 2010-11-17 LAB — DIFFERENTIAL
Basophils Absolute: 0 10*3/uL (ref 0.0–0.1)
Eosinophils Relative: 1 % (ref 0–5)
Lymphocytes Relative: 22 % (ref 12–46)
Monocytes Absolute: 0.4 10*3/uL (ref 0.1–1.0)
Monocytes Relative: 6 % (ref 3–12)
Neutro Abs: 5 10*3/uL (ref 1.7–7.7)

## 2010-11-17 LAB — CBC
HCT: 43.2 % (ref 36.0–46.0)
Hemoglobin: 13.6 g/dL (ref 12.0–15.0)
MCH: 29.1 pg (ref 26.0–34.0)
MCHC: 31.5 g/dL (ref 30.0–36.0)
MCV: 92.5 fL (ref 78.0–100.0)
RDW: 15.6 % — ABNORMAL HIGH (ref 11.5–15.5)

## 2010-11-18 LAB — URINALYSIS, ROUTINE W REFLEX MICROSCOPIC
Glucose, UA: NEGATIVE mg/dL
Hgb urine dipstick: NEGATIVE
Protein, ur: NEGATIVE mg/dL
Specific Gravity, Urine: 1.021 (ref 1.005–1.030)
pH: 7 (ref 5.0–8.0)

## 2010-11-18 LAB — URINE MICROSCOPIC-ADD ON

## 2010-11-23 ENCOUNTER — Inpatient Hospital Stay (HOSPITAL_COMMUNITY): Payer: Medicare Other

## 2010-11-23 ENCOUNTER — Inpatient Hospital Stay (HOSPITAL_COMMUNITY)
Admission: RE | Admit: 2010-11-23 | Discharge: 2010-11-25 | DRG: 468 | Disposition: A | Discharge status: Rehabilitation Facility | Payer: Medicare Other | Source: Ambulatory Visit | Attending: Orthopedic Surgery | Admitting: Orthopedic Surgery

## 2010-11-23 DIAGNOSIS — Y831 Surgical operation with implant of artificial internal device as the cause of abnormal reaction of the patient, or of later complication, without mention of misadventure at the time of the procedure: Secondary | ICD-10-CM | POA: Diagnosis present

## 2010-11-23 DIAGNOSIS — E039 Hypothyroidism, unspecified: Secondary | ICD-10-CM | POA: Diagnosis present

## 2010-11-23 DIAGNOSIS — M1610 Unilateral primary osteoarthritis, unspecified hip: Secondary | ICD-10-CM | POA: Diagnosis present

## 2010-11-23 DIAGNOSIS — T84099A Other mechanical complication of unspecified internal joint prosthesis, initial encounter: Principal | ICD-10-CM | POA: Diagnosis present

## 2010-11-23 DIAGNOSIS — M247 Protrusio acetabuli: Secondary | ICD-10-CM | POA: Diagnosis present

## 2010-11-23 DIAGNOSIS — Z96649 Presence of unspecified artificial hip joint: Secondary | ICD-10-CM

## 2010-11-23 DIAGNOSIS — M169 Osteoarthritis of hip, unspecified: Secondary | ICD-10-CM | POA: Diagnosis present

## 2010-11-23 DIAGNOSIS — Z01812 Encounter for preprocedural laboratory examination: Secondary | ICD-10-CM

## 2010-11-23 HISTORY — PX: OTHER SURGICAL HISTORY: SHX169

## 2010-11-23 LAB — ABO/RH: ABO/RH(D): A POS

## 2010-11-24 DIAGNOSIS — T8489XA Other specified complication of internal orthopedic prosthetic devices, implants and grafts, initial encounter: Secondary | ICD-10-CM

## 2010-11-24 DIAGNOSIS — Z96649 Presence of unspecified artificial hip joint: Secondary | ICD-10-CM

## 2010-11-24 LAB — CBC
HCT: 30.6 % — ABNORMAL LOW (ref 36.0–46.0)
MCHC: 30.7 g/dL (ref 30.0–36.0)
MCV: 92.7 fL (ref 78.0–100.0)
Platelets: 111 10*3/uL — ABNORMAL LOW (ref 150–400)
RDW: 15 % (ref 11.5–15.5)

## 2010-11-24 LAB — BASIC METABOLIC PANEL
BUN: 14 mg/dL (ref 6–23)
Calcium: 8.3 mg/dL — ABNORMAL LOW (ref 8.4–10.5)
Creatinine, Ser: 0.94 mg/dL (ref 0.4–1.2)
GFR calc non Af Amer: 57 mL/min — ABNORMAL LOW (ref >60)
Glucose, Bld: 96 mg/dL (ref 70–99)

## 2010-11-25 ENCOUNTER — Inpatient Hospital Stay (HOSPITAL_COMMUNITY)
Admission: RE | Admit: 2010-11-25 | Discharge: 2010-12-01 | DRG: 945 | Disposition: A | Discharge status: Rehabilitation Facility | Payer: Medicare Other | Source: Other Acute Inpatient Hospital | Attending: Physical Medicine & Rehabilitation | Admitting: Physical Medicine & Rehabilitation

## 2010-11-25 DIAGNOSIS — Z96649 Presence of unspecified artificial hip joint: Secondary | ICD-10-CM

## 2010-11-25 DIAGNOSIS — T84099A Other mechanical complication of unspecified internal joint prosthesis, initial encounter: Secondary | ICD-10-CM | POA: Diagnosis present

## 2010-11-25 DIAGNOSIS — D62 Acute posthemorrhagic anemia: Secondary | ICD-10-CM | POA: Diagnosis present

## 2010-11-25 DIAGNOSIS — Z5189 Encounter for other specified aftercare: Secondary | ICD-10-CM

## 2010-11-25 DIAGNOSIS — T8489XA Other specified complication of internal orthopedic prosthetic devices, implants and grafts, initial encounter: Secondary | ICD-10-CM

## 2010-11-25 DIAGNOSIS — E039 Hypothyroidism, unspecified: Secondary | ICD-10-CM | POA: Diagnosis present

## 2010-11-25 DIAGNOSIS — I498 Other specified cardiac arrhythmias: Secondary | ICD-10-CM | POA: Diagnosis present

## 2010-11-25 DIAGNOSIS — D696 Thrombocytopenia, unspecified: Secondary | ICD-10-CM | POA: Diagnosis present

## 2010-11-25 LAB — URINALYSIS, MICROSCOPIC ONLY
Bilirubin Urine: NEGATIVE
Nitrite: NEGATIVE
Specific Gravity, Urine: 1.015 (ref 1.005–1.030)
Urobilinogen, UA: 0.2 mg/dL (ref 0.0–1.0)

## 2010-11-25 LAB — BASIC METABOLIC PANEL
Calcium: 8.6 mg/dL (ref 8.4–10.5)
GFR calc Af Amer: >60 mL/min (ref >60)
GFR calc non Af Amer: 56 mL/min — ABNORMAL LOW (ref >60)
Glucose, Bld: 117 mg/dL — ABNORMAL HIGH (ref 70–99)
Sodium: 137 mEq/L (ref 135–145)

## 2010-11-25 LAB — CBC
MCHC: 31.8 g/dL (ref 30.0–36.0)
Platelets: 110 10*3/uL — ABNORMAL LOW (ref 150–400)
RDW: 15.1 % (ref 11.5–15.5)

## 2010-11-25 NOTE — Op Note (Signed)
Yvonne [name], Yvonne [name]              ACCOUNT NO.:  [id number]  MEDICAL RECORD NO.:  [id number]           PATIENT TYPE:  I  LOCATION:  0005                         FACILITY:  [facility]  PHYSICIAN:  Madlyn Frankel. Charlann Boxer, M.D.  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  PROCEDURE:  Revision of left total hip replacement using some allograft substitute, using a DePuy 58 Gription Pinnacle cup, 2 cancellus screws, 36 +4 10-degree liner, and a 36 +5 C-Taper Stryker ball from [employer] to match the previously placed cemented stem.  SURGEON:  Madlyn Frankel. Charlann Boxer, MD  ASSISTANT:  Jaquelyn Bitter. Chabon, P.A.C.  ANESTHESIA:  General.  SPECIMENS:  None.  COMPLICATIONS:  None.  DRAINS:  One Hemovac.  BLOOD LOSS:  About 300 cc.  INDICATION FOR THE PROCEDURE:  [name] is an 77 year old female who presented on evaluation of her left hip.  She had a cemented left hip hemiarthroplasty performed in the mid 1990s.  She had a fall recently with exacerbation of some left hip pain.  Radiographically, I was unable to determine if there was any significant acetabular pathology or fracture.  She was noted to have a wear pattern consistent with bipolar hemiarthroplasty with synovitic response and osteolytic response to acetabular protrusio.  We have discussed her current situation, the risks and benefits, and she wished at this point to proceed with the total hip revision based on her desired activity level. There was concern about further protrusio and complicating features down the road.  Risks of failed implant, infection, DVT, and dislocation were all discussed in this setting.  Consent was obtained for above.  PROCEDURE IN DETAIL:  The  patient was brought to the operative theater. Once adequate anesthesia and preoperative antibiotics, Ancef, administered, she was positioned into the right lateral decubitus position with left side up.  The left lower extremity was then prepped and draped in sterile fashion.  A time-out was performed identifying the patient, planned procedure, and extremity.  The patient had a previous Southern-type exposure more posterior to the trochanter and heading posterior.  Based on her soft tissue mobilization, I was able to utilize the same incision.  Following the soft tissue dissection, the iliotibial band and gluteal fascia were incised for posterior approach.  The posterior aspect of gluteus medius was identified, and the short external rotators and capsule taken down as a single unit.  I encountered clear synovial fluid at this point. Once the capsule was opened, there was noted to be significant synovitic response inside the hip joint consistent with the polyethylene wear and osteolysis.  We carefully then dislocated the femoral head, removed the bipolar prosthesis, and assessed the stem stability.  Previously placed cemented stem was stable.  At this point following exposure of the ilium proximally, I was able to fit the trunnion of the prosthesis on the ilium and place retractors to allow for exposure of the acetabulum.  I  debrided a significant fibrous layer on the surface of the acetabulum.  There appeared to have been an old fracture of the acetabulum given the appearance of the bone.  With the acetabulum exposed, I began reaming the cavity at which the 46 mm ball had been previously placed, was quite ectatic at this point.  I reamed up to a 57 reamer, which is where I got some contact of bone. She had very little host bone, it was very thin throughout the circumference of the acetabulum.  The bone was mainly cortical as well. I chose a 58 Gription cup.  Based on the fact that this  had protruded through the acetabulum already and a very thin wall, I did choose some bone graft allograft termed Actifuse ABX 5 cc.  It was then placed into the medial wall followed by placement of the 58 cup.  The cup seemed to have a good initial scratch fit, particularly around the rim and sat down on the host bone.  Two screws were placed into the ilium with good purchase.  Cup appeared to be positioned about 35-40 degrees of abduction and appeared to be positioned at about 10-15 degrees of anteversion.  Given the fact that I had an unknown version on the femoral component, I chose to use a trial of 36 +4, 10-degree face changing liner.  With this in place and using a 36 +5 ball based on this relatively short neck on the cemented prosthesis, trial reduction indicated a stable hip with leg lengths appearing to be comparable. Given these findings, the trial component was removed.  The final 36 +4, 10-degree AltrX liner was opened and placed with the lip liner positioned at approximately 2:30 for this left hip.  It was impacted with good visualized rim fit.  The final 36 +5 ball was opened and impacted onto the dried trunnion, and the hip was reduced.  We did irrigate the hip throughout the case and again at this point.  I reapproximated some of pseudo-capsular tissue back to the superior aspect of the capsular muscular junction proximally.  I placed a medium Hemovac drain deep.  The iliotibial band and gluteal fascia were reapproximated using #1 Vicryl.  The remainder of the wound was closed with 2-0 Vicryl and staples on the skin.  Skin was cleaned, dried, and dressed sterilely with Mepilex dressing.  She was then brought to the recovery room, extubated in stable condition tolerating the procedure well.  Based on her bone quality and the relatively thin rim of host bone, I am going to have her be partial weightbearing as previously noted in my preop assessment.  We will wait for  bone healing and ingrowth, which may take up to 2-3 months prior to letting her regain activities as tolerated.     Madlyn Frankel Charlann Boxer, M.D.     MDO/MEDQ  D:  11/23/2010  T:  11/23/2010  Job:  [id number]  Electronically Signed by Durene Romans M.D. on 11/25/2010 05:32:23 AM

## 2010-11-26 DIAGNOSIS — Z96649 Presence of unspecified artificial hip joint: Secondary | ICD-10-CM

## 2010-11-26 DIAGNOSIS — T8489XA Other specified complication of internal orthopedic prosthetic devices, implants and grafts, initial encounter: Secondary | ICD-10-CM

## 2010-11-26 LAB — CBC
HCT: 26.9 % — ABNORMAL LOW (ref 36.0–46.0)
Hemoglobin: 8.9 g/dL — ABNORMAL LOW (ref 12.0–15.0)
MCH: 29.9 pg (ref 26.0–34.0)
MCHC: 33.1 g/dL (ref 30.0–36.0)

## 2010-11-26 LAB — URINE CULTURE
Colony Count: NO GROWTH
Culture  Setup Time: 201203291844
Special Requests: NEGATIVE

## 2010-11-26 LAB — COMPREHENSIVE METABOLIC PANEL
BUN: 10 mg/dL (ref 6–23)
CO2: 28 mEq/L (ref 19–32)
Calcium: 9 mg/dL (ref 8.4–10.5)
Creatinine, Ser: 0.89 mg/dL (ref 0.4–1.2)
GFR calc Af Amer: >60 mL/min (ref >60)
GFR calc non Af Amer: >60 mL/min (ref >60)
Glucose, Bld: 105 mg/dL — ABNORMAL HIGH (ref 70–99)

## 2010-11-26 LAB — TYPE AND SCREEN
Donor AG Type: NEGATIVE
PT AG Type: NEGATIVE

## 2010-11-26 LAB — DIFFERENTIAL
Basophils Relative: 0 % (ref 0–1)
Monocytes Absolute: 0.7 10*3/uL (ref 0.1–1.0)
Monocytes Relative: 9 % (ref 3–12)
Neutro Abs: 5.2 10*3/uL (ref 1.7–7.7)

## 2010-11-27 LAB — URINALYSIS, MICROSCOPIC ONLY
Glucose, UA: NEGATIVE mg/dL
Hgb urine dipstick: NEGATIVE
Ketones, ur: NEGATIVE mg/dL
Protein, ur: 30 mg/dL — AB

## 2010-11-28 LAB — URINE CULTURE
Colony Count: NO GROWTH
Culture: NO GROWTH

## 2010-11-29 DIAGNOSIS — Z96649 Presence of unspecified artificial hip joint: Secondary | ICD-10-CM

## 2010-11-29 DIAGNOSIS — T8489XA Other specified complication of internal orthopedic prosthetic devices, implants and grafts, initial encounter: Secondary | ICD-10-CM

## 2010-11-29 NOTE — H&P (Signed)
Yvonne [name], Yvonne [name]              ACCOUNT NO.:  [id number]  MEDICAL RECORD NO.:  [id number]           PATIENT TYPE:  LOCATION:                                 FACILITY:  PHYSICIAN:  Madlyn Frankel. Charlann Boxer, M.D.  DATE OF BIRTH:  02-26-1926  DATE OF ADMISSION: DATE OF DISCHARGE:                             HISTORY & PHYSICAL   CHIEF COMPLAINT:  Left hip status post hemiarthroplasty with articular bearing wire.  HISTORY OF PRESENT ILLNESS:  This is an 77 year old lady with a history of left hip fracture with hemiarthroplasty in 1995 who has increased pain and osteolysis with acetabular protrusion.  After evaluation and discussion, the patient is now scheduled for revision of her acetabular component, possible revision of the entire implant.  The surgery, risks, benefits, and aftercare were discussed with the patient.  Questions were invited and answered.  She is not a candidate for tranexamic acid secondary to her cardiac issues and vascular issues.  PAST MEDICAL HISTORY:  Drug Allergies:  None.  Current Medications: 1. Synthroid 50 mcg daily. 2. Metoprolol 50 mg daily. 3. Aspirin 325 mg daily. 4. Iron 324 mg daily. 5. Vitamin D3 1000 units daily. 6. Folic acid 1 mg daily. 7. Calcium 1260 mg daily.  Medical Illnesses include: 1. Hypothyroidism. 2. Heart arrhythmias. 3. Heart valvular disease. 4. History of a ruptured thoracic aneurysm.  Previous surgeries include repair of ruptured thoracic aneurysm, vertebroplasty of L2, right inguinal hernia, left hip replacement, appendectomy, BTL, T&A, right fourth toe osteotomy, bilateral cataracts, and bilateral wrist pinning.  FAMILY HISTORY:  Positive for CVA, CHF, and ruptured aneurysm.  SOCIAL HISTORY:  The patient is widowed.  She is a retired Designer, jewellery.  She lives at home.  She does not smoke or drink.  REVIEW OF SYSTEMS:  CENTRAL NERVOUS SYSTEM:  Positive for vertigo and cataracts.  PULMONARY:  Negative for shortness  of breath, PND, or orthopnea.  CARDIOVASCULAR:  Positive for hypertension, valvular heart disease, history of heart arrhythmia, and history of thoracic aneurysm ruptured and repair.  GI:  Negative for ulcers or hepatitis.  GU: Negative for urinary tract difficulty.  MUSCULAR:  Positive as in HPI, plus the L2 vertebroplasty.  PHYSICAL EXAMINATION:  GENERAL:  A well-developed, well-nourished lady in no acute distress. VITAL SIGNS:  Blood pressure 100/58, respirations 16, and pulse 64 and regular. GENERAL APPEARANCE:  This is a well-developed, well-nourished, thin lady in no acute distress. HEENT:  Head is normocephalic.  Nose is patent.  Ears are patent. Pupils status post cataract excision.  Throat without injection. NECK:  Supple without adenopathy.  Carotids are 2+ without bruit. CHEST:  Clear to auscultation.  No rales or rhonchi.  Respirations 16. HEART:  Regular rate and rhythm at 64 beats per minute without murmur. ABDOMEN:  Soft with active bowel sounds.  No masses or organomegaly. NEUROLOGIC:  The patient is alert and oriented to time, place, and person.  Cranial nerves II-XII grossly intact. EXTREMITIES:  Left hip with decreased range of motion, especially internal rotation.  Neurovascular status is intact.  IMPRESSION:  Left hip failed hemiarthroplasty.  PLAN:  Left revision acetabular, possible  revision complete total hip arthroplasty on the left.  The patient will be going to Rehab postoperatively and has been given a prescription, but not to fill it unless she goes home directly from the orthopedic floor.     Jaquelyn Bitter. Chabon, P.A.   ______________________________ Madlyn Frankel Charlann Boxer, M.D.    SJC/MEDQ  D:  11/10/2010  T:  11/11/2010  Job:  [id number]  Electronically Signed by Jodene Nam P.A. on 11/29/2010 09:52:46 AM Electronically Signed by Durene Romans M.D. on 11/29/2010 12:14:31 PM

## 2010-11-30 LAB — CBC
Hemoglobin: 8.9 g/dL — ABNORMAL LOW (ref 12.0–15.0)
MCH: 28.3 pg (ref 26.0–34.0)
MCHC: 31.9 g/dL (ref 30.0–36.0)
MCV: 88.9 fL (ref 78.0–100.0)

## 2010-12-02 NOTE — H&P (Signed)
[name], Yvonne [name]              ACCOUNT NO.:  [id number]  MEDICAL RECORD NO.:  [id number]           PATIENT TYPE:  I  LOCATION:  4027                         FACILITY:  [facility]  PHYSICIAN:  Erick Colace, M.D.DATE OF BIRTH:  1926/08/25  DATE OF ADMISSION:  11/25/2010 DATE OF DISCHARGE:                             HISTORY & PHYSICAL   REASON FOR ADMISSION:  Left hip pain.  This is an 77 year old female with prior history of ruptured thoracic aortic aneurysm approximately 6 months ago who had a remote history of left hip hemiarthroplasty in 1995, developed increasing pain with acetabular protrusion on the left side, was seen by Dr. Charlann Boxer from Orthopedics.  She was placed in a wheelchair for 1 month prior to surgery to prevent progression of acetabular protrusion.  She underwent a THR revision on November 23, 2010, and was made partial weightbearing postoperatively and placed on subcu Lovenox for DVT prophylaxis.  The patient had acute blood loss anemia postoperatively with hemoglobin preop at 13.6 and postop at 9.6.  She was not transfused.  She was seen by PT and OT and because of slow progress PM and R consult was obtained on November 24, 2010, and the patient felt to be a good candidate for inpatient rehabilitation.  PAST MEDICAL HISTORY:  As noted above in addition the patient has a history of SVT status post nodal ablation in February 2012.  She also has a history of dehydration and in fact needed to be treated for this within the last month or so.  She has osteoporosis status post L2 vertebroplasty.  She has a chronic T12 compression fracture.  Hypothyroid, positive vertigo.  PAST SURGICAL HISTORY:  Right fourth toe osteotomy, bilateral wrist pinning, bilateral tubal ligation, appendectomy, tonsillectomy, and adenoidectomy.  FAMILY HISTORY:  Positive for CVA.  Positive for aneurysm.  SOCIAL HISTORY:  She lives with daughter in a basement apartment, 1 step to enter.  No  tobacco.  No EtOH.  Daughter can provide supervision after discharge, has some flexibility in her work schedule.  FUNCTIONAL HISTORY:  Independent at a wheelchair level for 1 month prior to surgery.  Prior to that, she was ambulating with an assisted device prior to her thoracic aortic aneurysm last fall.  She was fully independent and driving.  Goals for the family is to see the patient back to the level she was at prior to her thoracic aneurysm rupture.  HOME MEDICATIONS:  Ferrous sulfate, Tylenol, calcium, melatonin, vitamin D, aspirin, Toprol, Synthroid, and Cipro.  RECENT LABORATORY DATA:  BMET showing a BUN of 21, creatinine 1.02, sodium 140, and potassium 4.4.  Chest x-ray on November 17, 2010, without acute change.  PHYSICAL EXAMINATION:  GENERAL:  This is an elderly female who appears drowsy, but otherwise in no distress.  She answers questions appropriately and is oriented. VITAL SIGNS:  Blood pressure 105/64, pulse 102, respirations 18, and temp 98.7. HEENT:  Eyes, anicteric and noninjected.  External ENT normal. NECK:  Supple without adenopathy.  She wears upper dentures.  Tongue is midline. RESPIRATORY:  Respiratory effort is good.  Lungs are clear to auscultation. HEART:  Occasional  skipped beat, otherwise regular. EXTREMITIES:  Without edema.  Pedal pulses are normal. ABDOMEN:  Positive bowel sounds, soft and nontender to palpation. NEURO:  Her motor strength is 5/5 at bilateral deltoid, biceps, triceps, grip as well as right hip flexor and extensor, ankle dorsiflexor on the left side.  The patient is 2- hip flexion and extension and 4 ankle dorsiflexor and plantarflexor.  Mood, memory, affect, and orientation are intact with the exception of being anxious at times, but currently in somnolent. SKIN:  Left hip incision is clean and dry with staples intact.  Minimal bruising.  Sensitive to touch.  POSTADMISSION PHYSICIAN EVALUATION: 1. Functional deficits secondary to  failure of left hip     hemiarthroplasty hardware with revision of left hip replacement on     November 23, 2010, with partial weightbearing. 2. The patient is admitted to receive collaborative interdisciplinary     care between physiatrist, rehab nursing staff, and therapy team. 3. The patient's level of medical complexity and substantial therapy     needs in context of medical necessity cannot be provided at a     lesser intensity of care. 4. The patient has experienced substantial functional loss from her     baseline.  Upon functional assessment at the time of preadmission     screening, the patient was at a min assist level with transfers,     min assist ambulation 65 feet with rolling walker.  Upon functional     evaluation today, the patient is min assist transfers, min assist     ambulation 75 feet with rolling walker with ADLs.  She is mod     assist lower body care, setup for upper body care.  Judging by the     patient's diagnosis, physical exam, and functional history, the     patient has displayed ability to make functional progress which     will result in measurable gains while in inpatient rehab.  These     gains will be of substantial and practical use upon discharge to     home in facilitating mobility, self-care, and independence.     Interim changes of medical status since preadmission screening are     detailed in the history of present illness. 5. Physiatrist will provide 24-hour management of medical needs as     well as oversight of therapy plan/treatment and provide guidance as     appropriate regarding the interactions of two. 6. The 24-Hour Rehab Nursing will assess in management of skin, bowel,     and bladder and help integrate therapy, concepts, techniques, and     education. 7. PT will assess and treat for pre-gait training, gait training,     endurance, and safety.  Goals are to maintain hip precautions and     weightbearing precautions for safe functional  ambulation with least     restrictive device. 8. OT will assess and treat for ADLs, maintenance of proper hip     precautions, weightbearing precautions, and performance of ADLs,     safety, and equipment.  Goals are for a supervision level with     ADLs. 9. Case Management and Social Work will assess and treat for     psychosocial issues and discharge planning. 10.Team conference will be held weekly to assess the patient     progress/goals and to determine barriers to discharge. 11.The patient demonstrated sufficient medical stability and exercise     capacity to tolerate at least 3 hours of  therapy per day at least 5     days per week. 12.Estimated length of stay is 7-10 days and prognosis for further     functional improvement is good.  MEDICAL PROBLEM LIST AND PLAN: 1. Deep vein thrombosis prophylaxis:  Subcu Lovenox. 2. Pain management:  Vicodin. 3. Thrombocytopenia:  Monitor on Lovenox. 4. Supraventricular tachycardia:  On Toprol-XL.  History of ablation. 5. Acute blood loss anemia.  Recheck hemoglobin in a.m. 6. History of dehydration:  We will monitor I's and O's as well as     monitor BMET. 7. Hypothyroidism:  Continue supplementation.  Oriented the patient to Rehabilitation Services.  The patient appears to have adequate motivation and full rehabilitation.     Erick Colace, M.D.     AEK/MEDQ  D:  11/25/2010  T:  11/26/2010  Job:  [id number]  cc:   Veverly Fells. Altheimer, M.D. Madlyn Frankel Charlann Boxer, M.D.  Electronically Signed by Claudette Laws M.D. on 12/02/2010 04:13:05 PM

## 2010-12-05 LAB — CBC
MCHC: 34.1 g/dL (ref 30.0–36.0)
MCV: 96.2 fL (ref 78.0–100.0)
Platelets: 147 10*3/uL — ABNORMAL LOW (ref 150–400)
RBC: 4.71 MIL/uL (ref 3.87–5.11)
RDW: 13.8 % (ref 11.5–15.5)

## 2010-12-05 LAB — BASIC METABOLIC PANEL
BUN: 26 mg/dL — ABNORMAL HIGH (ref 6–23)
CO2: 30 mEq/L (ref 19–32)
Calcium: 10 mg/dL (ref 8.4–10.5)
Creatinine, Ser: 1.44 mg/dL — ABNORMAL HIGH (ref 0.4–1.2)
GFR calc Af Amer: 42 mL/min — ABNORMAL LOW (ref >60)

## 2010-12-07 LAB — CBC
HCT: 42.1 % (ref 36.0–46.0)
Hemoglobin: 14.6 g/dL (ref 12.0–15.0)
MCHC: 34.6 g/dL (ref 30.0–36.0)
Platelets: 184 10*3/uL (ref 150–400)
RDW: 13.4 % (ref 11.5–15.5)

## 2010-12-07 LAB — BASIC METABOLIC PANEL
CO2: 28 mEq/L (ref 19–32)
Calcium: 8.9 mg/dL (ref 8.4–10.5)
Creatinine, Ser: 1.25 mg/dL — ABNORMAL HIGH (ref 0.4–1.2)
GFR calc Af Amer: 50 mL/min — ABNORMAL LOW (ref >60)
GFR calc non Af Amer: 41 mL/min — ABNORMAL LOW (ref >60)
Sodium: 138 mEq/L (ref 135–145)

## 2010-12-21 NOTE — Discharge Summary (Signed)
NAMESELA, Yvonne [name]              ACCOUNT NO.:  [id number]  MEDICAL RECORD NO.:  [id number]           PATIENT TYPE:  I  LOCATION:  1531                         FACILITY:  [facility]  PHYSICIAN:  Madlyn Frankel. Charlann Boxer, M.D.  DATE OF BIRTH:  10-09-1925  DATE OF ADMISSION:  11/23/2010 DATE OF DISCHARGE:  11/25/2010                              DISCHARGE SUMMARY   ADMITTING DIAGNOSIS:  Failed left hip hemiarthroplasty.  DISCHARGE DIAGNOSES: 1. 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 2. Hypothyroidism. 3. Hypertension.  ADMITTING HISTORY:  Yvonne [name] is an 77 year old female who presented to office for evaluation of failed left hip hemiarthroplasty with obvious increased lysis and acetabular protrusio.  She had increasing pain and dysfunction.  She is now 17 years since her hemiarthroplasty. Given her increasing pain and discomfort, the only option management was revising this to a total hip replacement.  Risks and benefits and the plan of procedure were discussed and reviewed in the office setting prior to admission, consent was obtained from these discussions.  HOSPITAL COURSE:  The patient admitted for same-day surgery on November 23, 2010.  She underwent a conversion of previous left hip surgery to a left total hip replacement.  Please see dictated operative note for the full details of the procedure.  After routine stay in the recovery room, she then transferred to the orthopedic ward where she remained for the remainder of her hospital stay.  Her postoperative course was uncomplicated.  On postop day #1, her Foley catheter and Hemovac drains removed.  She was seen and evaluated by Physical Therapy.  We have made her partial weightbearing 50% on the left lower extremity due to the acetabular reconstruction. Her postoperative hematocrit was 30.6.  Electrolytes were stable.  By postoperative day #2, her hematocrit had remained  stable, her wound was clean and dry, she was neurovascularly intact.  At this point, family wished for her to consider discharge to Nursing today, the rehab unit consult was made for that.  By postoperative day #3 on November 26, 2010, she was prepared for discharge.  She was consulted by [facility] and was subsequently admitted to their service.  Condition at transfer to rehab was stable.  DISCHARGE INSTRUCTIONS:  She will remain at partial weightbearing 50% until further directed in our office.  She would need to return to see Dr. Durene Romans at [facility] at [phone] in 2 weeks' time for wound evaluation, she may shower, but should keep her wound as dry as possible.  She will be on a regular diet postoperatively.  DISCHARGE MEDICATIONS:  Her discharge medications will include her home medications, but also include, 1. Iron 325 mg b.i.d. for 2-3 weeks. 2. Calcium plus vitamin D daily. 3. Melatonin. 4. Vitamin D3. 5. Aspirin. 6. Levothyroxine 50 mcg tablet q.a.m. 7. Metoprolol 50 mg q.a.m.  In addition, she will be placed on, 1. Colace 100 mg p.o. b.i.d. for constipation while on pain medicine. 2. MiraLax 17 g p.o. daily as needed for constipation while on pain     medicine. 3. Norco 5/325  mg 1-2 tablets every 4-6 hours needed for pain. 4. Robaxin 500 mg p.o. q.6 h. as needed for muscle spasm pain.     Madlyn Frankel Charlann Boxer, M.D.     MDO/MEDQ  D:  12/20/2010  T:  12/21/2010  Job:  [id number]  Electronically Signed by Durene Romans M.D. on 12/21/2010 04:48:06 PM

## 2010-12-24 NOTE — Discharge Summary (Signed)
Yvonne [name], Yvonne [name]              ACCOUNT NO.:  [id number]  MEDICAL RECORD NO.:  [id number]           PATIENT TYPE:  I  LOCATION:  4140                         FACILITY:  [facility]  PHYSICIAN:  Erick Colace, M.D.DATE OF BIRTH:  28-Dec-1925  DATE OF ADMISSION:  11/25/2010 DATE OF DISCHARGE:  12/01/2010                              DISCHARGE SUMMARY   DISCHARGE DIAGNOSES: 1. 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 2. Acute blood loss anemia. 3. Hypothyroidism. 4. Thrombocytopenia, resolved.  HISTORY OF PRESENT ILLNESS:  Yvonne [name] is an 77 year old female with history of ruptured thoracic aneurysm, SVT, left hip hemi in 1995 with increased pain recently.  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 Charlann Boxer.  Postop, the patient is partial weightbearing and on subcu Lovenox for DVT prophylaxis.  He is noted to have acute blood loss anemia with hemoglobin at 9.6 and thrombocytopenia with platelets at 110.  This has been monitored along. Therapies were initiated and the patient is noted to be deconditioned requiring rest breaks with mobility.  The patient was evaluated by Rehab and felt that she would benefit from a CIR program.  PAST MEDICAL HISTORY:  Significant for L2 vertebroplasty, right inguinal hernia repair, history of SVT with nodal ablation in February 2012, chronic T12 compression fracture, hypothyroid, right fourth toe osteotomy, bilateral wrist pinning, left hip hemi in 1995, appendectomy, vertigo, bilateral tubal ligation, T and A, and repair of thoracic aneurysm rupture in October 2011.  ALLERGIES:  No known drug allergies.  Family history is positive for CVA and aneurysm.  SOCIAL HISTORY:  The patient lives with daughter in a basement apartment with one-step at entry.  Does not use any tobacco or alcohol.  Daughter can  work out at home and provide supervision past discharge.  FUNCTIONAL HISTORY:  The patient was independent at wheelchair level for the past few months due to issues with pain and instability of hip.  She was able to take a few steps.  She was independent with her ADLs.  FUNCTIONAL STATUS:  The patient is min-assist transfers, min-assist ambulating 75 feet with rolling walker, set up for upper body care, mod assist for lower body care.  PHYSICAL EXAM:  VITAL SIGNS:  Blood pressure 105/64, pulse 102, respirations 18, temperature 98.7. GENERAL:  The patient is elderly female who appears a little drowsy, otherwise in no acute distress.  She is oriented x3. HEENT:  Eyes anicteric, noninjected.  Oral mucosa is pink and moist. Nares patent.  The patient wears upper dentures. NECK:  Supple without JVD or lymphadenopathy. LUNGS:  Good respiratory effort and are clear to auscultation bilaterally. HEART:  Occasional PVCs, otherwise regular.  No murmurs. EXTREMITIES:  Show some minimal edema in the left hip, with left hip incision clean, dry, intact with staples in place.  No peripheral edema noted. ABDOMEN:  Soft, nontender with positive bowel sounds. NEUROLOGIC:  The patient is oriented x3.  Motor strength is 5/5 bilateral deltoids, biceps, triceps and grips as well as right hip  flexion/extension and ankle dorsiflexion.  On the left side, the patient has 2/5 hip flexion and extension, 4/5 plantar flexion and dorsiflexion. Mood, memory, affect are intact except the patient being anxious occasionally.  HOSPITAL COURSE:  Yvonne [name] was admitted to rehab on November 25, 2010 for inpatient therapies to consist of PT, OT at least 3 hours 5 days a week.  Past admission, physiatrist, rehab RN, and therapy team have worked together to provide customized collaborative interdisciplinary care.  Rehab RN has worked with the patient on bowel and bladder program as well as wound monitoring.  The  patient's left hip incision is noted to be healing well with minimal ecchymosis.  Staples remain in place.  The patient's blood pressures were checked on b.i.d. basis during this stay.  These are ranging from 100 to occasional high and 160 systolic, 50s to 84O diastolic.  The patient has been afebrile during this stay.  Labs were done past admission for followup on her anemia.  This revealed H and H at 8.9 and 26.9.  Platelets improving at 121.  Most recent CBC of April 3rd shows thrombocytopenia to have resolved with platelets at 200, H and H at 8.9 and 27.9, and white count at 4.4.  A UA/UC was done past admission.  UA was negative.  Urine culture showed no growth.  The patient has had some issues with frequency, urgency.  A repeat UA/UC was done on November 27, 2010 and this was negative with urine culture showing no growth.  The patient was treated with Pyridium as well as was started on Ditropan 2.5 mg p.o. b.i.d. to help with frequency symptoms.  The patient was maintained on subcu Lovenox for DVT prophylaxis and is to continue on this for 2 weeks total of anticoagulation therapy.  Pain control has been reasonable with p.r.n. use of Ultram.  During the patient's stay in rehab team, a conference was held to monitor the patient's progress, set goals as well as discuss barriers to discharge.  At admission, the patient was noted to have poor endurance and decreased standing balance, generalized weakness, as well as left hip pain.  She required min assist for all functional mobility.  She has progressed well during her rehab stay and is currently at modified independent level for all transfers and mobility.  Her overall strength has improved and pain level has also greatly improved.  OT has worked with the patient on self-care tasks.  They have worked with the patient on utilization of Adaptic equipment to help carry out her hip precautions as well as to carry out her ADL tasks.   Currently, the patient is independent for lower body bathing and dressing with use of Adaptic equipment.  She is maintaining her partial weightbearing status without difficulty.  Further followup home health therapies to include PT/OT past discharge.  On December 01, 2010, the patient is discharged to home.  DISCHARGE MEDICATIONS: 1. Ferrous sulfate 325 mg p.o. t.i.d. 2. Os-Cal plus D one p.o. b.i.d. 3. Levothyroxine 50 mcg per day. 4. Toprol-XL 50 mg a day. 5. MiraLax 17 grams in 8 ounces per day. 6. Coated aspirin may increase to 325 mg p.o. per day. 7. Ditropan 2.5 mg p.o. b.i.d. 8. Ultram 50 mg p.o. q.4-6 hours p.r.n. pain.  Activity level is partial weightbearing, left lower extremity, and maintain left total hip precautions.  Use walker for mobility.  Diet is regular.  FOLLOWUP:  The patient to follow up with Dr. Wynn Banker as needed. Followup  with Dr. Charlann Boxer in the next 7-10 days.  Follow up with Dr. Leslie Dales for routine check in 2 weeks.     Delle Reining, P.A.   ______________________________ Erick Colace, M.D.    PL/MEDQ  D:  11/30/2010  T:  12/01/2010  Job:  [id number]  cc:   Madlyn Frankel Charlann Boxer, M.D. Veverly Fells. Altheimer, M.D.  Electronically Signed by Osvaldo Shipper. on 12/08/2010 02:01:56 PM Electronically Signed by Claudette Laws M.D. on 12/24/2010 06:07:45 AM

## 2011-01-11 NOTE — Assessment & Plan Note (Signed)
OFFICE VISIT   Yvonne [name]  DOB:  07-26-1926                                        July 29, 2010  CHART #:  [id number]   HISTORY:  The patient returns to the office today in followup visit  after her presentation on June 24, 2010, with a ruptured ascending  aorta.  On June 25, 2010, the diagnosis was made and she underwent  emergency replacement of the ascending aorta, hemiarch, and resuspension  of the aortic valve with circulatory arrest.  She had a fairly long  postoperative course, but not unreasonable considering her age of 28  years and fairly frail status.  She was discharged to rehab, but just  prior to being ready to go home, developed rapid atrial fibrillation  that required readmission to the CCU.  Her rhythm was stabilized and  ultimately she was discharged home.  She comes to the office today and  notes that she is gradually increasing her strength at home.  Physical  therapy has been working with her several times a week.  She is able to  walk around her house and around outside.  She has had no overt symptoms  of congestive heart failure.  Since discharge, she has not been aware of  any rapid heart rate.   PHYSICAL EXAMINATION:  Today, she appears to be making good progress.  Her blood pressure is 130/60, pulse is 60, respiratory rate is 18, and  O2 sats 96%.  Her lungs are clear bilaterally.  Cardiac exam reveals  regular rate and rhythm.  She has no murmur of aortic insufficiency.  She had a small lymphocele in the right groin that was aspirated while  she was in the hospital.  This is recurred, but not nearly as large as  it was.  In the office today, we prepped the area and aspirated about 10  mL of clear lymph fluid from the site with collapse.  We will continue  to monitor this.   MEDICATIONS:  She continues on Ensure, folic acid, iron, Protonix, 325  of aspirin, Synthroid 50 mcg, Toprol-XL 50 a day, vitamin D,  flecainide  50 twice a day, Celexa once a day, and stool softeners.   IMPRESSION:  Overall, she is making good progress postoperatively.  She  is to see Dr. Royann Shivers later this week and Dr. Johney Frame to consider atrial  ablation on September 03, 2010.  Overall, I am pleased with her progress  specially considering her preoperative condition.  We will plan to see  her back in 3 months or sooner at Cardiology request for her request.  Three to six months after surgery, we will consider a repeat CT scan of  the chest and abdomen to reevaluate her aortic situation.   Yvonne Plane, MD  Electronically Signed   EG/MEDQ  D:  07/29/2010  T:  07/30/2010  Job:  [id number]   cc:   Thurmon Fair, MD  Hillis Range, MD  [facility]

## 2011-01-14 NOTE — Op Note (Signed)
Yvonne [name], Yvonne [name]              ACCOUNT NO.:  [id number]   MEDICAL RECORD NO.:  [id number]           PATIENT TYPE:   LOCATION:                                 FACILITY:   PHYSICIAN:  Coletta Memos, M.D.     DATE OF BIRTH:  November 26, 1925   DATE OF PROCEDURE:  DATE OF DISCHARGE:                               OPERATIVE REPORT   PREOPERATIVE DIAGNOSIS:  L2 compression fracture.   POSTOPERATIVE DIAGNOSIS:  L2 compression fracture.   PROCEDURE:  L2 kyphoplasty using Kyphon system.   COMPLICATIONS:  None.   SURGEON:  Coletta Memos, MD   ANESTHESIA:  General endotracheal.   INDICATIONS:  Yvonne [name] presented with an L2 compression fracture,  significant pain.  I offered and she agreed to undergo kyphoplasty.   OPERATIVE NOTE:  Yvonne [name] was brought to the operating room  intubated and placed under general anesthetic.  She was rolled prone  onto body rolls and all pressure points were properly padded.  Her back  was prepped.  She was draped in a sterile fashion.  Using biplanar  fluoroscopy, I localized the L2 vertebra and pedicles.  Using  transpedicular approach, I placed 2 needles after making stab incisions  on the right and left side which took the trocar into the vertebral  body.  I then took out the trocar and had an empty cannula and set the  balloon device into the vertebral body, 1 on the right and 1 on the  left.  I then created cavities for the dye.  Keeping the endplates  intact.  After that, I then used methyl methacrylate, infused with dye  and infused that into the cavities created.  There was no restoration of  height, but there was good filling of the bone bilaterally without any  spilling of cement into the epidural plexus.  I then removed the  cannulas leaving behind no tails and the pressures of the methyl  methacrylate without difficulty.  I used a single Vicryl to close the  each stab incision.  Final x-ray in the AP and lateral planes looked  quite good.   Sterile dressings were applied.           ______________________________  Coletta Memos, M.D.     KC/MEDQ  D:  03/17/2009  T:  03/18/2009  Job:  [id number]

## 2011-04-20 ENCOUNTER — Other Ambulatory Visit: Payer: Self-pay | Admitting: Cardiothoracic Surgery

## 2011-04-20 DIAGNOSIS — I712 Thoracic aortic aneurysm, without rupture: Secondary | ICD-10-CM

## 2011-05-27 ENCOUNTER — Encounter: Payer: Self-pay | Admitting: Cardiothoracic Surgery

## 2011-05-27 DIAGNOSIS — S32020A Wedge compression fracture of second lumbar vertebra, initial encounter for closed fracture: Secondary | ICD-10-CM | POA: Insufficient documentation

## 2011-05-27 DIAGNOSIS — E8770 Fluid overload, unspecified: Secondary | ICD-10-CM | POA: Insufficient documentation

## 2011-05-27 DIAGNOSIS — Z8679 Personal history of other diseases of the circulatory system: Secondary | ICD-10-CM | POA: Insufficient documentation

## 2011-05-27 DIAGNOSIS — I4891 Unspecified atrial fibrillation: Secondary | ICD-10-CM | POA: Insufficient documentation

## 2011-05-27 DIAGNOSIS — I712 Thoracic aortic aneurysm, without rupture: Secondary | ICD-10-CM

## 2011-05-30 ENCOUNTER — Other Ambulatory Visit: Payer: Self-pay | Admitting: Cardiothoracic Surgery

## 2011-05-30 LAB — CREATININE, SERUM: Creat: 1.08 mg/dL (ref 0.50–1.10)

## 2011-05-30 LAB — BUN: BUN: 20 mg/dL (ref 6–23)

## 2011-06-02 ENCOUNTER — Encounter: Payer: Self-pay | Admitting: Cardiothoracic Surgery

## 2011-06-02 ENCOUNTER — Ambulatory Visit
Admission: RE | Admit: 2011-06-02 | Discharge: 2011-06-02 | Disposition: A | Discharge status: Home / Self Care | Payer: Medicare Other | Source: Ambulatory Visit | Attending: Cardiothoracic Surgery | Admitting: Cardiothoracic Surgery

## 2011-06-02 ENCOUNTER — Ambulatory Visit (INDEPENDENT_AMBULATORY_CARE_PROVIDER_SITE_OTHER): Payer: Medicare Other | Admitting: Cardiothoracic Surgery

## 2011-06-02 VITALS — BP 152/70 | HR 71 | Resp 18 | Ht 68.0 in | Wt 124.0 lb

## 2011-06-02 DIAGNOSIS — I712 Thoracic aortic aneurysm, without rupture: Secondary | ICD-10-CM

## 2011-06-02 DIAGNOSIS — I719 Aortic aneurysm of unspecified site, without rupture: Secondary | ICD-10-CM

## 2011-06-02 MED ORDER — IOHEXOL 300 MG/ML  SOLN
100.0000 mL | Freq: Once | INTRAMUSCULAR | Status: AC | PRN
  Administered 2011-06-02: 100 mL via INTRAVENOUS

## 2011-06-02 NOTE — Progress Notes (Signed)
[address]            [place] [address]          [phone]       Scheryl Darter Date of Birth: [birth year]  Thurmon Fair, MD No primary provider on file.  Chief Complaint:  Chief Complaint  Patient presents with  . Follow-up    6 month f/u with Chest Ct, eval thoracic aorta and f/u on previous aortic repair     History of Present Illness: Patient returns today for followup visit after her ruptured ascending aortic aneurysm with dissection. On 06/25/2010 she underwent replacement I. aorta and hemi-arch with resuspension of the aortic valve circulatory arrest. Since that time she is slowly improved to the point now where she is living independently, and brought herself to the office today in followup alone. She notes that she's been doing well without symptoms of congestive heart failure or chest discomfort. Since her surgery in the fall of 2011 she's had atrial fib ablation and repair of her hip. A followup CT scan of the chest is done today.  Past Medical History  Diagnosis Date  . Thoracic aortic aneurysm     Ruptured ascending  . History of atrial paroxysmal tachycardia   . History of hypothyroidism   . Compression fracture of L2 lumbar vertebra   . Afib     Post op  . Acute blood loss anemia   . Volume overload     Past Surgical History  Procedure Date  . Revision of left total hip replacement using some allograft 11/23/2010  . Classic atrioventricular nodal reentrant 10/20/2010  . Replacement of ascending aorta and hemiarch and 06/28/2010  . L2 kyphoplasty using kyphon system. 03/17/2009    History  Smoking status  . Never Smoker   Smokeless tobacco  . Not on file    History  Alcohol Use No    No Known Allergies  Current Outpatient Prescriptions  Medication Sig Dispense Refill  . aspirin 325 MG EC tablet Take 325 mg by mouth daily.        . calcium-vitamin D (OSCAL) 250-125 MG-UNIT per tablet Take 1 tablet by  mouth daily.        Marland Kitchen levothyroxine (SYNTHROID, LEVOTHROID) 50 MCG tablet Take 50 mcg by mouth daily.        . metoprolol (TOPROL-XL) 50 MG 24 hr tablet Take 50 mg by mouth daily.         No current facility-administered medications for this visit.   Facility-Administered Medications Ordered in Other Visits  Medication Dose Route Frequency Provider Last Rate Last Dose  . iohexol (OMNIPAQUE) 300 MG/ML injection 100 mL  100 mL Intravenous Once PRN Medication Radiologist   100 mL at 06/02/11 0909    Family history: At the time of her surgery we do not have a complete family history she does now note that one brother died at age 77 of a ruptured aneurysm the specifics are unknown. 2 brothers have had the aortic valve replacement one was noted to have a "thin aorta" at the time of valve replacement one sister died at age 37 years old of diphtheria 3 sisters are living.  Review of Systems: Review of Systems  Constitutional:no recent weight loss./gain, anorexia, fatigue, nausea, night sweats, fever or chills  Eyes:no blurred vision, diplopia, vision changes  ENT:no hearing loss, epistaxis,  Cardiac:  no chest pain, palpitations, syncope, congestive heart failure  Respiratory: No cough, wheezing, hemoptysis, shortness of breath, paroxysmal nocturnal dyspnea, dyspnea on exertion, or orthopnea  GI: No gallstones, vomiting, dysphagia, melena, hematochezia or heartburn  Skin: No rash, swelling, hair loss, peripheral edema or itching  Musculoskeletal: No myalgias, joint swelling, joint erythema, joint pain, back pain  Hematology: No bruising, bleeding, anemia  Neuro: No headaches, stroke, vertigo, seizures, paresthesias, difficulty walking  Psych: No depression, anxiety  Endocrine: No diabetes, no thyroid dysfunction  Other: None   Physical Exam:  BP 152/70  Pulse 71  Resp 18  Ht 5\' 8"  (1.727 m)  Wt 124 lb (56.246 kg)  BMI 18.85 kg/m2  SpO2 98%  The patient is awake alert and  neurologically intact and able to relate her history in good detail. She is able to get around relatively well especially considering the last time I saw her she was confined to a walker. She has no jugular venous distention carotids are without bruits Cardiac exam feels a regular rate and rhythm I do not appreciate a murmur of aortic insufficiency Her sternum is stable and well healed The right groin cannulation site is also well healed She has no pedal edema or calf tenderness She has full and equal brachial radial femoral and pedal pulses   Diagnostic Studies & Laboratory data:  RADIOLOGY REPORT*  Clinical Data: Postop repair of a leaking thoracic aortic aneurysm  1 year ago.  CT ANGIOGRAPHY CHEST WITH CONTRAST  Technique: Multidetector CT imaging of the chest was performed  using the standard protocol during bolus administration of  intravenous contrast. Multiplanar CT image reconstructions  including MIPs were obtained to evaluate the vascular anatomy.  Contrast: 100 ml Omnipaque 300 IV.  Comparison: 06/25/2010  Findings: The patient is status post repair of the previously seen  ascending aortic aneurysm. The graft measures approximately 3.5  cm. Near the proximal suture line, there is some irregular / ill-  defined high density near the suture line outside of the lumen.  This most likely represents postoperative change and felt material.  There is low density posterior to the graft, likely thrombosed  native aneurysm sac. The aorta at the superior suture line near  the proximal aortic arch measures maximally 5.1 cm compared 6.3 cm  previously.  Calcifications noted within the proximal left coronary artery,  predominately proximal and mid-LAD. Heart is mildly enlarged. No  mediastinal, hilar, or axillary adenopathy. Visualized thyroid and  chest wall soft tissues unremarkable.  Scarring noted in the apices, stable. No acute opacities in the  lungs. No pleural effusions.    Imaging into the upper abdomen shows no acute findings.  Review of the MIP images confirms the above findings.  IMPRESSION:  Status post tube graft repair of the ascending thoracic aortic  aneurysm as described above. High density material external to the  lumen at the proximal suture line is felt to represent felt  material. Distal ascending thoracic aorta at the distal suture  line measures 5.1 cm. Low density material posterior to the  ascending aorta and graft likely represents a thrombosed native  aneurysm sac.  Left proximal and mid-LAD calcifications.  Scarring in the apices. Mild hyperinflation.  These results were discussed with Dr. Tyrone Sage at the time of  interpretation.  Original Report Authenticated By: Cyndie Chime, M.D.    Assessment / Plan:  Patient appears to be doing very well following aortic rupture acutely with replacement operation the aorta last year. Expected postoperative  changes are noted on the CT scan she does not appear to have any false aneurysm, a hemi-arch was done, the aorta is 5 cm in the arch which is smaller than it was at the time of her original surgery. I do not see any need for any intervention at this point. I will plan to see her back in one year with a followup CT scan of the chest.       Sheniqua Carolan B

## 2011-06-02 NOTE — Patient Instructions (Addendum)
Doing well  Follow up CT scan of chest one year.   Aortic Dissection Aortic dissection happens when there is a tear in the inner wall of the aorta. The aorta is the largest blood vessel in the body. It carries blood from the heart to the arteries, and those arteries send blood to all parts of the body. When there is a tear, the blood enters inside the aortic wall and creates a new space for the blood. This causes the aorta to split even more. The collection of blood into this new space may lower the amount of blood that reaches the rest of the body. A dissection causes the aortic wall to be weakened and can cause rupture. Death can occur. It is critical to have medical care right away. CAUSES Aortic dissection is caused by two things:  A small tear that develops in a weak or injured part of the aortic wall.   The tear spreads inside the aortic wall and the aorta becomes "double-barreled."  The following increase the risk of aortic dissection:  High blood pressure.   Hardening of the walls of arteries.   Use of cocaine.   Blunt injury to the chest.   Genetic disorders that affect the connective tissue (one example is Marfan syndrome).   Problems (such as infection) that affect either the aorta or the heart valve.   Problems that affect the tissue that holds together different parts of your body (connective tissue). For example, a few uncommon arthritis problems can increase the chance of an aortic dissection.  SYMPTOMS  Sudden onset of severe chest pain. The pain may be sharp, stabbing, or ripping in nature.   The pain may then shift to the shoulder, arm, neck, jaw, abdomen, or hips.  Other symptoms may include:  Breathing trouble.   Weakness of any part of the body.   Decreased feeling of any part of the body.   Fainting.   Dizziness.   Feeling sick to your stomach (nausea) and vomiting.   Sweating a lot.   Confusion/dazed.  DIAGNOSIS Testing may include the  following:  Electrocardiogram.   Chest x-ray.   Imaging study done after injecting a dye to clearly view the blood vessel (aortic angiography).   Specialized x-ray of the chest is done after injecting a dye (CT scan) or an MRI.   A test where the image of the heart is studied using sound waves (Echocardiogram).  TREATMENT   Treatment will vary depending on what part of the aorta has the problem. Your caregiver may admit you into an intensive care unit. Medicines that reduce the blood pressure and strong painkillers may be given right away. In many cases, heart medicines may be given to relieve the symptoms and help blood pressure.   If medicines are used first and they do not help, then surgery may be done to repair or replace the damaged portion of the aorta. In some cases, surgery is needed right away. You may have to stay in the hospital for 7-10 days. If the aortic valve is damaged due to aortic dissection, repair or replacement of the valve may be done. If the arteries of the heart are affected, bypass surgery of the heart may be done. THIS IS AN EMERGENCY. Call 911  (911 in [place] right away or the closest medical facility.   Document Released: 11/22/2007 Document Re-Released: 11/09/2009 [facility] Patient Information 2011 [place], [place].

## 2012-04-23 ENCOUNTER — Ambulatory Visit
Admission: RE | Admit: 2012-04-23 | Discharge: 2012-04-23 | Disposition: A | Discharge status: Home / Self Care | Payer: Medicare Other | Source: Ambulatory Visit | Attending: Cardiovascular Disease | Admitting: Cardiovascular Disease

## 2012-04-23 ENCOUNTER — Other Ambulatory Visit: Payer: Self-pay | Admitting: Cardiovascular Disease

## 2012-04-23 DIAGNOSIS — R0602 Shortness of breath: Secondary | ICD-10-CM

## 2012-04-23 DIAGNOSIS — R05 Cough: Secondary | ICD-10-CM

## 2012-05-11 ENCOUNTER — Other Ambulatory Visit: Payer: Self-pay | Admitting: Cardiothoracic Surgery

## 2012-05-11 DIAGNOSIS — I712 Thoracic aortic aneurysm, without rupture: Secondary | ICD-10-CM

## 2012-05-11 DIAGNOSIS — I719 Aortic aneurysm of unspecified site, without rupture: Secondary | ICD-10-CM

## 2012-06-02 LAB — CREATININE, SERUM: Creat: 0.96 mg/dL (ref 0.50–1.10)

## 2012-06-02 LAB — BUN: BUN: 22 mg/dL (ref 6–23)

## 2012-06-07 ENCOUNTER — Ambulatory Visit
Admission: RE | Admit: 2012-06-07 | Discharge: 2012-06-07 | Disposition: A | Discharge status: Home / Self Care | Payer: Medicare Other | Source: Ambulatory Visit | Attending: Cardiothoracic Surgery | Admitting: Cardiothoracic Surgery

## 2012-06-07 ENCOUNTER — Ambulatory Visit (INDEPENDENT_AMBULATORY_CARE_PROVIDER_SITE_OTHER): Payer: Medicare Other | Admitting: Cardiothoracic Surgery

## 2012-06-07 ENCOUNTER — Encounter: Payer: Self-pay | Admitting: Cardiothoracic Surgery

## 2012-06-07 VITALS — BP 135/60 | HR 67 | Resp 18 | Ht 68.0 in | Wt 124.0 lb

## 2012-06-07 DIAGNOSIS — Z9889 Other specified postprocedural states: Secondary | ICD-10-CM

## 2012-06-07 DIAGNOSIS — I711 Thoracic aortic aneurysm, ruptured: Secondary | ICD-10-CM

## 2012-06-07 DIAGNOSIS — Z8679 Personal history of other diseases of the circulatory system: Secondary | ICD-10-CM

## 2012-06-07 DIAGNOSIS — I712 Thoracic aortic aneurysm, without rupture: Secondary | ICD-10-CM

## 2012-06-07 DIAGNOSIS — I719 Aortic aneurysm of unspecified site, without rupture: Secondary | ICD-10-CM

## 2012-06-07 MED ORDER — IOHEXOL 350 MG/ML SOLN
100.0000 mL | Freq: Once | INTRAVENOUS | Status: AC | PRN
  Administered 2012-06-07: 100 mL via INTRAVENOUS

## 2012-06-07 NOTE — Progress Notes (Signed)
[address]            [place] [address]          [phone]      Yvonne [name] [facility] Medical Record #[id number] Date of Birth: Sep 07, 1925  Referring: Thurmon Fair, MD Primary Care: No primary provider on file.  Chief Complaint:    Chief Complaint  Patient presents with  . Follow-up    1 year f/u with CTA Chest, S/P repair of ruptured ascending aortic aneurysm with dissection on 06/25/10    History of Present Illness:    Patient returns today for followup CT scan of the chest after having repair of a ruptured ascending aortic aneurysm and dissection and resuspension of aortic valve. She notes she's had some rhythm problems and is now on flecainide. She recently fell in her house and injured her left hip and is now on a walker.  She notes a cardiology recently did an echocardiogram and was told that it was "okay".       Current Activity/ Functional Status: Limited currently using a walker because of injury to her left hip   Past Medical History  Diagnosis Date  . Thoracic aortic aneurysm     Ruptured ascending  . History of atrial paroxysmal tachycardia   . History of hypothyroidism   . Compression fracture of L2 lumbar vertebra   . Afib     Post op  . Acute blood loss anemia   . Volume overload     Past Surgical History  Procedure Date  . Revision of left total hip replacement using some allograft 11/23/2010  . Classic atrioventricular nodal reentrant 10/20/2010  . L2 kyphoplasty using kyphon system. 03/17/2009  . Replacement of ascending aorta and hemiarch and resuspension of the aortic valve with circulatory cardiopulmonary bypass and circulatory arrest 06/25/2010    Dr Tyrone Sage    No family history on file.  History   Social History  . Marital Status: Widowed    Spouse Name: N/A    Number of Children: N/A  . Years of Education: N/A   Occupational History  . Not on file.   Social History Main Topics    . Smoking status: Never Smoker   . Smokeless tobacco: Not on file  . Alcohol Use: No  . Drug Use: No  . Sexually Active: Not on file   Other Topics Concern  . Not on file   Social History Narrative  . No narrative on file    History  Smoking status  . Never Smoker   Smokeless tobacco  . Not on file    History  Alcohol Use No     No Known Allergies  Current Outpatient Prescriptions  Medication Sig Dispense Refill  . aspirin 325 MG EC tablet Take 325 mg by mouth daily.        . calcium-vitamin D (OSCAL) 250-125 MG-UNIT per tablet Take 1 tablet by mouth daily.        . flecainide (TAMBOCOR) 50 MG tablet Take 50 mg by mouth 2 (two) times daily.       Marland Kitchen levothyroxine (SYNTHROID, LEVOTHROID) 50 MCG tablet Take 50 mcg by mouth daily.        . metoprolol (TOPROL-XL) 50 MG 24 hr tablet Take 50 mg by mouth daily.         No current facility-administered medications for this visit.  Facility-Administered Medications Ordered in Other Visits  Medication Dose Route Frequency Provider Last Rate Last Dose  . iohexol (OMNIPAQUE) 350 MG/ML injection 100 mL  100 mL Intravenous Once PRN Medication Radiologist, MD   100 mL at 06/07/12 1152       Physical Exam: BP 135/60  Pulse 67  Resp 18  Ht 5\' 8"  (1.727 m)  Wt 124 lb (56.246 kg)  BMI 18.85 kg/m2  SpO2 100%  General appearance: alert, cooperative and cachectic Neurologic: intact Heart: regular rate and rhythm and diastolic murmur: mid diastolic 2/6, decrescendo at 2nd left intercostal space Lungs: clear to auscultation bilaterally and normal percussion bilaterally Abdomen: soft, non-tender; bowel sounds normal; no masses,  no organomegaly Extremities: extremities normal, atraumatic, no cyanosis or edema, Homans sign is negative, no sign of DVT and Patient has pain in the left hip from recent fall on previously replaced Wound: Sternal incision is stable and well healed   Diagnostic Studies & Laboratory data:     Recent  Radiology Findings:  Ct Angio Chest W/cm &/or Wo Cm  06/07/2012  *RADIOLOGY REPORT*  Clinical Data: Thoracic aortic aneurysm repair, follow-up.  CT ANGIOGRAPHY CHEST  Technique:  Multidetector CT imaging of the chest using the standard protocol during bolus administration of intravenous contrast. Multiplanar reconstructed images including MIPs were obtained and reviewed to evaluate the vascular anatomy.  Contrast: OMNIPAQUE IOHEXOL 350 MG/ML SOLN  Comparison: 06/02/2011  Findings: Changes of tube graft repair of the ascending thoracic aorta again noted, unchanged.  Just above the proximal anastomosis, diameter measures 3.8 cm compared 3.5 cm previously.  At the distal anastomosis, maximum diameter is 5.3 cm compared 5.1 cm previously. Again noted is the high density material external to the lumen proximally, felt to be felt material.  Great vessels remain patent. The low density material posterior to the tube graft is felt to likely represent a thrombosed native lumen and is unchanged.  There is cardiomegaly.  Descending thoracic aorta is stable caliber.  Scarring in the apices bilaterally, unchanged.  No pleural effusions or pericardial effusion.  No acute airspace opacities.  Imaging into the upper abdomen shows no acute findings.  IMPRESSION: Post tube graft repair of the ascending thoracic aorta.  Slight interval increase in overall size, measuring 3.8 cm proximally compared to 3.5 cm previously and 5.3 cm distally compared with 5.1 cm previously.  Otherwise no interval change.   Original Report Authenticated By: Cyndie Chime, M.D.       Recent Lab Findings: Lab Results  Component Value Date   WBC 4.4 11/30/2010   HGB 8.9* 11/30/2010   HCT 27.9* 11/30/2010   PLT 200 11/30/2010   GLUCOSE 105* 11/26/2010   ALT 11 11/26/2010   AST 25 11/26/2010   NA 134* 11/26/2010   K 3.8 11/26/2010   CL 96 11/26/2010   CREATININE 0.96 06/01/2012   BUN 22 06/01/2012   CO2 28 11/26/2010   TSH 13.757* 07/15/2010    INR 0.92 11/17/2010      Assessment / Plan:      Patient will return in one year with followup CT scan Very slight change in aortic arch size, would not recommend surgical intervention at this time     Delight Ovens MD  Beeper [phone] Office [phone] 06/07/2012 12:30 PM

## 2012-08-23 ENCOUNTER — Emergency Department (HOSPITAL_COMMUNITY): Payer: Medicare Other

## 2012-08-23 ENCOUNTER — Emergency Department (HOSPITAL_COMMUNITY)
Admission: EM | Admit: 2012-08-23 | Discharge: 2012-08-23 | Disposition: A | Discharge status: Home / Self Care | Payer: Medicare Other | Attending: Emergency Medicine | Admitting: Emergency Medicine

## 2012-08-23 ENCOUNTER — Encounter (HOSPITAL_COMMUNITY): Payer: Self-pay | Admitting: *Deleted

## 2012-08-23 DIAGNOSIS — Z862 Personal history of diseases of the blood and blood-forming organs and certain disorders involving the immune mechanism: Secondary | ICD-10-CM | POA: Insufficient documentation

## 2012-08-23 DIAGNOSIS — S32010A Wedge compression fracture of first lumbar vertebra, initial encounter for closed fracture: Secondary | ICD-10-CM

## 2012-08-23 DIAGNOSIS — X500XXA Overexertion from strenuous movement or load, initial encounter: Secondary | ICD-10-CM | POA: Insufficient documentation

## 2012-08-23 DIAGNOSIS — Z79899 Other long term (current) drug therapy: Secondary | ICD-10-CM | POA: Insufficient documentation

## 2012-08-23 DIAGNOSIS — Y92009 Unspecified place in unspecified non-institutional (private) residence as the place of occurrence of the external cause: Secondary | ICD-10-CM | POA: Insufficient documentation

## 2012-08-23 DIAGNOSIS — M545 Low back pain: Secondary | ICD-10-CM

## 2012-08-23 DIAGNOSIS — E039 Hypothyroidism, unspecified: Secondary | ICD-10-CM | POA: Insufficient documentation

## 2012-08-23 DIAGNOSIS — Y9389 Activity, other specified: Secondary | ICD-10-CM | POA: Insufficient documentation

## 2012-08-23 DIAGNOSIS — I10 Essential (primary) hypertension: Secondary | ICD-10-CM | POA: Insufficient documentation

## 2012-08-23 DIAGNOSIS — Z8781 Personal history of (healed) traumatic fracture: Secondary | ICD-10-CM | POA: Insufficient documentation

## 2012-08-23 DIAGNOSIS — Z7982 Long term (current) use of aspirin: Secondary | ICD-10-CM | POA: Insufficient documentation

## 2012-08-23 DIAGNOSIS — S32009A Unspecified fracture of unspecified lumbar vertebra, initial encounter for closed fracture: Secondary | ICD-10-CM | POA: Insufficient documentation

## 2012-08-23 DIAGNOSIS — Z8679 Personal history of other diseases of the circulatory system: Secondary | ICD-10-CM | POA: Insufficient documentation

## 2012-08-23 HISTORY — DX: Essential (primary) hypertension: I10

## 2012-08-23 HISTORY — DX: Nonrheumatic mitral (valve) prolapse: I34.1

## 2012-08-23 MED ORDER — METHOCARBAMOL 500 MG PO TABS: 500.0000 mg | ORAL_TABLET | Freq: Two times a day (BID) | ORAL | Status: DC

## 2012-08-23 MED ORDER — OXYCODONE-ACETAMINOPHEN 5-325 MG PO TABS
1.0000 | ORAL_TABLET | Freq: Once | ORAL | Status: AC
  Administered 2012-08-23: 1 via ORAL
  Filled 2012-08-23: qty 1

## 2012-08-23 MED ORDER — OXYCODONE-ACETAMINOPHEN 5-325 MG PO TABS: 2.0000 | ORAL_TABLET | ORAL | Status: DC | PRN

## 2012-08-23 NOTE — ED Notes (Signed)
Last week - injured lower back last Tuesday; went to pcp on wed. And put on ultram, flexeril; symptoms not resolving. pcp did not do no x-rays. Dx. Back spasms. Hx. Compression fx, and osteoporosis.

## 2012-08-23 NOTE — ED Notes (Signed)
Patient transported to X-ray 

## 2012-08-23 NOTE — ED Provider Notes (Signed)
History     CSN: [id number]  Arrival date & time 08/23/12  1410   First MD Initiated Contact with Patient 08/23/12 1942      No chief complaint on file.   (Consider location/radiation/quality/duration/timing/severity/associated sxs/prior treatment) Patient is a 78 y.o. female presenting with injury. The history is provided by the patient. No language interpreter was used.  Injury  Episode onset: 1 wk ago. The incident occurred at home. Injury mechanism: torsion - reaching for object. Context: twist. The wounds were self-inflicted. No protective equipment was used. She came to the ER via personal transport. There is an injury to the lower back. The pain is moderate. Pertinent negatives include no chest pain, no abdominal pain, no nausea, no vomiting, no headaches and no cough.    Past Medical History  Diagnosis Date  . Thoracic aortic aneurysm     Ruptured ascending  . History of atrial paroxysmal tachycardia   . History of hypothyroidism   . Compression fracture of L2 lumbar vertebra   . Afib     Post op  . Acute blood loss anemia   . Volume overload   . Hypertension   . Mitral valve prolapse     Past Surgical History  Procedure Date  . Revision of left total hip replacement using some allograft 11/23/2010  . Classic atrioventricular nodal reentrant 10/20/2010  . L2 kyphoplasty using kyphon system. 03/17/2009  . Replacement of ascending aorta and hemiarch and resuspension of the aortic valve with circulatory cardiopulmonary bypass and circulatory arrest 06/25/2010    Dr Tyrone Sage  . Ablation     No family history on file.  History  Substance Use Topics  . Smoking status: Never Smoker   . Smokeless tobacco: Not on file  . Alcohol Use: No    OB History    Grav Para Term Preterm Abortions TAB SAB Ect Mult Living                  Review of Systems  Constitutional: Negative for fever and chills.  HENT: Negative for congestion and sore throat.   Respiratory:  Negative for cough and shortness of breath.   Cardiovascular: Negative for chest pain and leg swelling.  Gastrointestinal: Negative for nausea, vomiting, abdominal pain, diarrhea and constipation.  Genitourinary: Negative for dysuria and frequency.  Musculoskeletal: Positive for back pain and gait problem.  Skin: Negative for color change and rash.  Neurological: Negative for dizziness and headaches.  Psychiatric/Behavioral: Negative for confusion and agitation.  All other systems reviewed and are negative.    Allergies  Review of patient's allergies indicates no known allergies.  Home Medications   Current Outpatient Rx  Name  Route  Sig  Dispense  Refill  . ASPIRIN 325 MG PO TBEC   Oral   Take 325 mg by mouth daily.           Marland Kitchen CALCIUM-VITAMIN D 250-125 MG-UNIT PO TABS   Oral   Take 1 tablet by mouth daily.           Marland Kitchen FLECAINIDE ACETATE 50 MG PO TABS   Oral   Take 50 mg by mouth 2 (two) times daily.          Marland Kitchen LEVOTHYROXINE SODIUM 50 MCG PO TABS   Oral   Take 50 mcg by mouth daily.           Marland Kitchen METHOCARBAMOL 500 MG PO TABS   Oral   Take 500 mg by mouth every 6 (six)  hours as needed. For muscle spasms         . METOPROLOL SUCCINATE ER 50 MG PO TB24   Oral   Take 50 mg by mouth daily.           Marland Kitchen NITROFURANTOIN MONOHYD MACRO 100 MG PO CAPS   Oral   Take 100 mg by mouth at bedtime.         . TRAMADOL HCL 50 MG PO TABS   Oral   Take 50-100 mg by mouth every 6 (six) hours as needed. For pain           BP 199/54  Pulse 65  Temp 97.6 F (36.4 C) (Oral)  Resp 18  SpO2 92%  Physical Exam  Vitals reviewed. Constitutional: She is oriented to person, place, and time. She appears well-developed and well-nourished. No distress.  HENT:  Head: Normocephalic and atraumatic.  Eyes: EOM are normal. Pupils are equal, round, and reactive to light.  Neck: Normal range of motion. Neck supple.  Cardiovascular: Normal rate and regular rhythm.     Pulmonary/Chest: Effort normal. No respiratory distress.  Abdominal: Soft. She exhibits no distension.  Musculoskeletal: Normal range of motion. She exhibits no edema.       Lumbar back: She exhibits tenderness and bony tenderness.       Back:  Neurological: She is alert and oriented to person, place, and time. She has normal strength. No sensory deficit.  Reflex Scores:      Patellar reflexes are 2+ on the right side and 2+ on the left side. Skin: Skin is warm and dry.  Psychiatric: She has a normal mood and affect. Her behavior is normal.    ED Course  Procedures (including critical care time)  Labs Reviewed - No data to display No results found.  DG Lumbar Spine Complete (Final result)   Result time:08/23/12 2055    Final result by Rad Results In Interface (08/23/12 20:55:31)    Narrative:   *RADIOLOGY REPORT*  Clinical Data: Pain post lifting  LUMBAR SPINE - COMPLETE 4+ VIEW  Comparison: 06/07/2012 and earlier studies  Findings: Previous kyphoplasty/vertebroplasty at L2 with some cement extension towards the L2-3 interspace as before. Old T12 compression fracture deformity. New compression fracture deformity of L1 with approximately 30% loss of height. No definite retropulsion. Posterior elements appear intact. Alignment is preserved.  The previous median sternotomy. Patchy aortic calcifications. Left hip arthroplasty components partially seen.  IMPRESSION:  1. New L1 compression fracture deformity. 2. Old compression fracture deformities of T11 and L2. 3. Stable postoperative changes.   Original Report Authenticated By: D. Andria Rhein, MD             No diagnosis found.    MDM  Pt w/ hx of osteoporosis and compressions fx presents to ED for acute lower back pain. States last wk she turned and felt acute onset LBP - was seen by pcp and given flexeril and ultram. C/o persistent and worsening pain. Denies radiculopathy, LE weakness, saddle anesthesia,  or bowel/bladder dysfunction. Admits to constipation. Exam - ttp right paraspinal - Lumbar - 5/5 strength in BLE, normal sensation, 2+ patellar reflexes, neg clonus and neg babinski, normal sensation. Xray reveals Acute compression fx L1. D/w neurosurgery - recommend they call office in am for work in apt. No focal neuro deficit on exam. No indication for acute intervention. Given rx for percocet and robaxin. Pt already has l/s support belt. Given strict return precautions and follow up instructions.  1. Compression fracture of L1 lumbar vertebra   2. Lower back pain    New Prescriptions   METHOCARBAMOL (ROBAXIN) 500 MG TABLET    Take 1 tablet (500 mg total) by mouth 2 (two) times daily.   OXYCODONE-ACETAMINOPHEN (PERCOCET) 5-325 MG PER TABLET    Take 2 tablets by mouth every 4 (four) hours as needed for pain.   Yvonne Hurt, MD [address]                         [address] [place] [address] [phone]      Audelia Hives, MD 08/23/12 [phone]

## 2012-08-25 NOTE — ED Provider Notes (Signed)
I saw and evaluated the patient, reviewed the resident's note and I agree with the findings and plan.  Krishav Mamone, MD 08/25/12 1741 

## 2012-09-04 ENCOUNTER — Encounter (HOSPITAL_COMMUNITY): Payer: Self-pay | Admitting: *Deleted

## 2012-09-04 ENCOUNTER — Encounter (HOSPITAL_COMMUNITY): Payer: Self-pay

## 2012-09-04 ENCOUNTER — Other Ambulatory Visit: Payer: Self-pay | Admitting: Neurosurgery

## 2012-09-04 NOTE — Progress Notes (Signed)
Pt said surgeon's office said NPO after 0800.

## 2012-09-04 NOTE — Progress Notes (Signed)
I faxed a request to [place] [facility] requesting EKG and last office note.

## 2012-09-05 ENCOUNTER — Encounter (HOSPITAL_COMMUNITY): Payer: Self-pay | Admitting: Anesthesiology

## 2012-09-05 ENCOUNTER — Inpatient Hospital Stay (HOSPITAL_COMMUNITY): Payer: Medicare Other

## 2012-09-05 ENCOUNTER — Inpatient Hospital Stay (HOSPITAL_COMMUNITY): Payer: Medicare Other | Admitting: Anesthesiology

## 2012-09-05 ENCOUNTER — Observation Stay (HOSPITAL_COMMUNITY)
Admission: RE | Admit: 2012-09-05 | Discharge: 2012-09-06 | Disposition: A | Discharge status: Home / Self Care | Payer: Medicare Other | Source: Ambulatory Visit | Attending: Neurosurgery | Admitting: Neurosurgery

## 2012-09-05 ENCOUNTER — Encounter (HOSPITAL_COMMUNITY): Payer: Self-pay | Admitting: *Deleted

## 2012-09-05 ENCOUNTER — Encounter (HOSPITAL_COMMUNITY)
Admission: RE | Disposition: A | Discharge status: Home / Self Care | Payer: Self-pay | Source: Ambulatory Visit | Attending: Neurosurgery

## 2012-09-05 DIAGNOSIS — M8448XA Pathological fracture, other site, initial encounter for fracture: Principal | ICD-10-CM | POA: Insufficient documentation

## 2012-09-05 DIAGNOSIS — I10 Essential (primary) hypertension: Secondary | ICD-10-CM | POA: Insufficient documentation

## 2012-09-05 HISTORY — DX: Urinary tract infection, site not specified: N39.0

## 2012-09-05 HISTORY — DX: Unspecified osteoarthritis, unspecified site: M19.90

## 2012-09-05 HISTORY — DX: Personal history of other medical treatment: Z92.89

## 2012-09-05 HISTORY — PX: KYPHOPLASTY: SHX5884

## 2012-09-05 HISTORY — DX: Gastro-esophageal reflux disease without esophagitis: K21.9

## 2012-09-05 HISTORY — DX: Hypothyroidism, unspecified: E03.9

## 2012-09-05 LAB — BASIC METABOLIC PANEL
Calcium: 9.5 mg/dL (ref 8.4–10.5)
Creatinine, Ser: 1.02 mg/dL (ref 0.50–1.10)
GFR calc Af Amer: 57 mL/min — ABNORMAL LOW (ref >90)
GFR calc non Af Amer: 49 mL/min — ABNORMAL LOW (ref >90)

## 2012-09-05 LAB — CBC
Platelets: 211 10*3/uL (ref 150–400)
RDW: 14.9 % (ref 11.5–15.5)
WBC: 6.5 10*3/uL (ref 4.0–10.5)

## 2012-09-05 SURGERY — KYPHOPLASTY
Anesthesia: General | Site: Back | Wound class: Clean

## 2012-09-05 MED ORDER — METHOCARBAMOL 500 MG PO TABS: 500.0000 mg | ORAL_TABLET | Freq: Three times a day (TID) | ORAL | Status: DC | PRN

## 2012-09-05 MED ORDER — FLECAINIDE ACETATE 50 MG PO TABS
50.0000 mg | ORAL_TABLET | Freq: Two times a day (BID) | ORAL | Status: DC
  Administered 2012-09-05 – 2012-09-06 (×2): 50 mg via ORAL
  Filled 2012-09-05 (×3): qty 1

## 2012-09-05 MED ORDER — EPHEDRINE SULFATE 50 MG/ML IJ SOLN
INTRAMUSCULAR | Status: DC | PRN
  Administered 2012-09-05: 15 mg via INTRAVENOUS
  Administered 2012-09-05: 5 mg via INTRAVENOUS

## 2012-09-05 MED ORDER — OXYCODONE HCL 5 MG PO TABS
5.0000 mg | ORAL_TABLET | ORAL | Status: DC | PRN
  Administered 2012-09-05: 5 mg via ORAL
  Filled 2012-09-05: qty 1

## 2012-09-05 MED ORDER — MUPIROCIN 2 % EX OINT
TOPICAL_OINTMENT | Freq: Two times a day (BID) | CUTANEOUS | Status: DC
  Administered 2012-09-05: 1 via NASAL
  Administered 2012-09-06 (×2): via NASAL
  Filled 2012-09-05 (×2): qty 22

## 2012-09-05 MED ORDER — ONDANSETRON HCL 4 MG/2ML IJ SOLN
INTRAMUSCULAR | Status: DC | PRN
  Administered 2012-09-05: 4 mg via INTRAVENOUS

## 2012-09-05 MED ORDER — PROPOFOL 10 MG/ML IV BOLUS
INTRAVENOUS | Status: DC | PRN
  Administered 2012-09-05: 100 mg via INTRAVENOUS

## 2012-09-05 MED ORDER — LIDOCAINE-EPINEPHRINE 0.5 %-1:200000 IJ SOLN
INTRAMUSCULAR | Status: DC | PRN
  Administered 2012-09-05 (×2): 5 mL

## 2012-09-05 MED ORDER — LEVOTHYROXINE SODIUM 50 MCG PO TABS
50.0000 ug | ORAL_TABLET | Freq: Every day | ORAL | Status: DC
  Administered 2012-09-06: 50 ug via ORAL
  Filled 2012-09-05 (×3): qty 1

## 2012-09-05 MED ORDER — POTASSIUM CHLORIDE IN NACL 20-0.9 MEQ/L-% IV SOLN
INTRAVENOUS | Status: DC
  Administered 2012-09-06: 1000 mL via INTRAVENOUS
  Filled 2012-09-05 (×2): qty 1000

## 2012-09-05 MED ORDER — METOPROLOL SUCCINATE ER 50 MG PO TB24
50.0000 mg | ORAL_TABLET | Freq: Every day | ORAL | Status: DC
  Administered 2012-09-06: 50 mg via ORAL
  Filled 2012-09-05: qty 1

## 2012-09-05 MED ORDER — HYDROMORPHONE HCL PF 1 MG/ML IJ SOLN: 0.2500 mg | INTRAMUSCULAR | Status: DC | PRN

## 2012-09-05 MED ORDER — CEFAZOLIN SODIUM 1-5 GM-% IV SOLN
INTRAVENOUS | Status: AC
  Filled 2012-09-05: qty 50

## 2012-09-05 MED ORDER — ASPIRIN EC 325 MG PO TBEC
325.0000 mg | DELAYED_RELEASE_TABLET | Freq: Every day | ORAL | Status: DC
  Administered 2012-09-06: 325 mg via ORAL
  Filled 2012-09-05: qty 1

## 2012-09-05 MED ORDER — GLYCOPYRROLATE 0.2 MG/ML IJ SOLN
INTRAMUSCULAR | Status: DC | PRN
  Administered 2012-09-05: .5 mg via INTRAVENOUS

## 2012-09-05 MED ORDER — NEOSTIGMINE METHYLSULFATE 1 MG/ML IJ SOLN
INTRAMUSCULAR | Status: DC | PRN
  Administered 2012-09-05: 2.5 mg via INTRAVENOUS

## 2012-09-05 MED ORDER — ROCURONIUM BROMIDE 100 MG/10ML IV SOLN
INTRAVENOUS | Status: DC | PRN
  Administered 2012-09-05: 30 mg via INTRAVENOUS

## 2012-09-05 MED ORDER — IOHEXOL 300 MG/ML  SOLN
INTRAMUSCULAR | Status: DC | PRN
  Administered 2012-09-05: 50 mL

## 2012-09-05 MED ORDER — MUPIROCIN 2 % EX OINT
TOPICAL_OINTMENT | CUTANEOUS | Status: AC
  Administered 2012-09-05: 1 via NASAL
  Filled 2012-09-05: qty 22

## 2012-09-05 MED ORDER — CEFAZOLIN SODIUM 1-5 GM-% IV SOLN
INTRAVENOUS | Status: DC | PRN
  Administered 2012-09-05: 1 g via INTRAVENOUS

## 2012-09-05 MED ORDER — PHENYLEPHRINE HCL 10 MG/ML IJ SOLN
INTRAMUSCULAR | Status: DC | PRN
  Administered 2012-09-05: 40 ug via INTRAVENOUS

## 2012-09-05 MED ORDER — LIDOCAINE HCL (CARDIAC) 20 MG/ML IV SOLN
INTRAVENOUS | Status: DC | PRN
  Administered 2012-09-05: 40 mg via INTRAVENOUS

## 2012-09-05 MED ORDER — FENTANYL CITRATE 0.05 MG/ML IJ SOLN
INTRAMUSCULAR | Status: DC | PRN
  Administered 2012-09-05 (×2): 50 ug via INTRAVENOUS

## 2012-09-05 MED ORDER — KETOROLAC TROMETHAMINE 15 MG/ML IJ SOLN
15.0000 mg | Freq: Four times a day (QID) | INTRAMUSCULAR | Status: DC
  Administered 2012-09-06 (×2): 15 mg via INTRAVENOUS
  Filled 2012-09-05 (×6): qty 1

## 2012-09-05 MED ORDER — LACTATED RINGERS IV SOLN
INTRAVENOUS | Status: DC | PRN
  Administered 2012-09-05 (×2): via INTRAVENOUS

## 2012-09-05 MED ORDER — ACETAMINOPHEN 10 MG/ML IV SOLN
1000.0000 mg | Freq: Four times a day (QID) | INTRAVENOUS | Status: DC
  Administered 2012-09-06 (×2): 1000 mg via INTRAVENOUS
  Filled 2012-09-05 (×4): qty 100

## 2012-09-05 SURGICAL SUPPLY — 41 items
ADH SKN CLS APL DERMABOND .7 (GAUZE/BANDAGES/DRESSINGS) ×1
BLADE SURG 15 STRL LF DISP TIS (BLADE) ×1 IMPLANT
BLADE SURG 15 STRL SS (BLADE) ×2
BLADE SURG ROTATE 9660 (MISCELLANEOUS) IMPLANT
CEMENT BONE KYPHX HV R (Orthopedic Implant) ×1 IMPLANT
CLOTH BEACON ORANGE TIMEOUT ST (SAFETY) ×2 IMPLANT
CONT SPEC 4OZ CLIKSEAL STRL BL (MISCELLANEOUS) ×3 IMPLANT
DERMABOND ADVANCED (GAUZE/BANDAGES/DRESSINGS) ×1
DERMABOND ADVANCED .7 DNX12 (GAUZE/BANDAGES/DRESSINGS) ×1 IMPLANT
DEVICE BIOPSY BONE KYPHX (INSTRUMENTS) ×1 IMPLANT
DRAPE C-ARM 42X72 X-RAY (DRAPES) ×3 IMPLANT
DRAPE LAPAROTOMY 100X72X124 (DRAPES) ×2 IMPLANT
DRAPE PROXIMA HALF (DRAPES) ×2 IMPLANT
DRAPE SURG 17X23 STRL (DRAPES) ×1 IMPLANT
DRESSING TELFA 8X3 (GAUZE/BANDAGES/DRESSINGS) IMPLANT
DURAPREP 26ML APPLICATOR (WOUND CARE) ×2 IMPLANT
GAUZE SPONGE 4X4 16PLY XRAY LF (GAUZE/BANDAGES/DRESSINGS) ×2 IMPLANT
GLOVE BIO SURGEON STRL SZ 6.5 (GLOVE) ×2 IMPLANT
GLOVE ECLIPSE 6.5 STRL STRAW (GLOVE) ×2 IMPLANT
GLOVE EXAM NITRILE LRG STRL (GLOVE) IMPLANT
GLOVE EXAM NITRILE MD LF STRL (GLOVE) IMPLANT
GLOVE EXAM NITRILE XL STR (GLOVE) IMPLANT
GLOVE EXAM NITRILE XS STR PU (GLOVE) IMPLANT
GLOVE INDICATOR 7.0 STRL GRN (GLOVE) ×2 IMPLANT
GOWN BRE IMP SLV AUR LG STRL (GOWN DISPOSABLE) ×5 IMPLANT
GOWN BRE IMP SLV AUR XL STRL (GOWN DISPOSABLE) IMPLANT
GOWN STRL REIN 2XL LVL4 (GOWN DISPOSABLE) IMPLANT
KIT BASIN OR (CUSTOM PROCEDURE TRAY) ×2 IMPLANT
KIT ROOM TURNOVER OR (KITS) ×2 IMPLANT
NDL HYPO 25X1 1.5 SAFETY (NEEDLE) ×1 IMPLANT
NEEDLE HYPO 25X1 1.5 SAFETY (NEEDLE) ×2 IMPLANT
NS IRRIG 1000ML POUR BTL (IV SOLUTION) ×2 IMPLANT
PACK SURGICAL SETUP 50X90 (CUSTOM PROCEDURE TRAY) ×2 IMPLANT
PAD ARMBOARD 7.5X6 YLW CONV (MISCELLANEOUS) ×6 IMPLANT
SPECIMEN JAR SMALL (MISCELLANEOUS) IMPLANT
STAPLER SKIN PROX WIDE 3.9 (STAPLE) ×1 IMPLANT
SUT VIC AB 3-0 SH 8-18 (SUTURE) ×2 IMPLANT
SYR CONTROL 10ML LL (SYRINGE) ×3 IMPLANT
TOWEL OR 17X24 6PK STRL BLUE (TOWEL DISPOSABLE) ×2 IMPLANT
TOWEL OR 17X26 10 PK STRL BLUE (TOWEL DISPOSABLE) ×2 IMPLANT
TRAY KYPHOPAK 20/3 ONESTEP 1ST (MISCELLANEOUS) ×1 IMPLANT

## 2012-09-05 NOTE — Op Note (Signed)
09/05/2012  8:44 PM  PATIENT:  Yvonne [name]  79 y.o. female  PRE-OPERATIVE DIAGNOSIS:  L1 compression fracture  POST-OPERATIVE DIAGNOSIS:  L1 compression fracture  PROCEDURE:  Procedure(s): KYPHOPLASTY L1  SURGEON:  Surgeon(s): Carmela Hurt, MD  ASSISTANTS:none  ANESTHESIA:   general  EBL:  Total I/O In: 1200 [I.V.:1200] Out: 0   BLOOD ADMINISTERED:none  CELL SAVER GIVEN:none   COUNT:per nursing  DRAINS: none   SPECIMEN:  No Specimen  DICTATION: [name] was brought to the operating room and intubated without difficulty. She was positioned on body rolls prone with all pressure points padded. Her back was prepped and draped in a sterile manner. Using the fluoroscopy I planned my entry point on both the right and left sides.  I made a stab incision and introduced the trocar into the L1 vertebral body via the pedicle on the left side. I had a very good position on the trocar so I introduced the balloon. I achieved some reduction with the balloon with about 5cc of volume. I then infiltrated 7.5cc methylmethacrylate into the vertebral body. There was no extravasation beyond the endplates. I removed the trocar without difficulty. I closed the incision with a single suture. I used dermabond for a sterile dressing.   PLAN OF CARE: Admit for overnight observation  PATIENT DISPOSITION:  PACU - hemodynamically stable.   Delay start of Pharmacological VTE agent (>24hrs) due to surgical blood loss or risk of bleeding:  yes

## 2012-09-05 NOTE — Progress Notes (Signed)
Pt states that she has been seen at [facility] 06/2012.  Req copies of EKG, Stress, ECHo, last OV note and any heart testing from there.

## 2012-09-05 NOTE — Anesthesia Postprocedure Evaluation (Signed)
  Anesthesia Post-op Note  Patient: Yvonne [name]  Procedure(s) Performed: Procedure(s) (LRB) with comments: KYPHOPLASTY (N/A) - L1 kyphoplasty  Patient Location: PACU  Anesthesia Type:General  Level of Consciousness: awake and alert   Airway and Oxygen Therapy: Patient Spontanous Breathing and Patient connected to nasal cannula oxygen  Post-op Pain: none  Post-op Assessment: Post-op Vital signs reviewed, Patient's Cardiovascular Status Stable, Respiratory Function Stable, Patent Airway and No signs of Nausea or vomiting  Post-op Vital Signs: Reviewed and stable  Complications: No apparent anesthesia complications

## 2012-09-05 NOTE — H&P (Signed)
BP 181/71  Pulse 72  Temp 98.1 F (36.7 C) (Oral)  Resp 20  Ht 5\' 6"  (1.676 m)  Wt 54.432 kg (120 lb)  BMI 19.37 kg/m2  SpO2 94% Yvonne [name] is a 79 y.o. female With a new L1 compression fracture. She has been in significant pain since lifting a 4lb bag of sugar. She has no neurologic deficits, and has been wearing a brace for the pain.  No Known Allergies Past Medical History  Diagnosis Date  . Thoracic aortic aneurysm     Ruptured ascending  . History of atrial paroxysmal tachycardia   . History of hypothyroidism   . Compression fracture of L2 lumbar vertebra   . Acute blood loss anemia   . Volume overload   . Hypertension   . Mitral valve prolapse   . Hypothyroidism   . Afib     -no   . History of blood transfusion   . Shortness of breath     with  exertion  . UTI (lower urinary tract infection)   . GERD (gastroesophageal reflux disease)   . Arthritis    Past Surgical History  Procedure Date  . Revision of left total hip replacement using some allograft 11/23/2010  . Classic atrioventricular nodal reentrant 10/20/2010  . L2 kyphoplasty using kyphon system. 03/17/2009  . Replacement of ascending aorta and hemiarch and resuspension of the aortic valve with circulatory cardiopulmonary bypass and circulatory arrest 06/25/2010    Dr Tyrone Sage  . Ablation   . Tubal ligation 1966  . Tonsillectomy   . Appendectomy   . Hernia repair 1993    Right Inguinal  . Eye surgery 2003    Bil  . Toe osteotomy     Right 2nd   . Percutaneous pinning wrist fracture 1995    bil  . Breast surgery     right biospy   Prior to Admission medications   Medication Sig Start Date End Date Taking? Authorizing Provider  aspirin 325 MG EC tablet Take 325 mg by mouth daily.    Yes Historical Provider, MD  flecainide (TAMBOCOR) 50 MG tablet Take 50 mg by mouth 2 (two) times daily.  05/11/12  Yes Historical Provider, MD  levothyroxine (SYNTHROID, LEVOTHROID) 50 MCG tablet Take 50 mcg by  mouth daily.     Yes Historical Provider, MD  methocarbamol (ROBAXIN) 500 MG tablet Take 500 mg by mouth 3 (three) times daily as needed.   Yes Historical Provider, MD  metoprolol (TOPROL-XL) 50 MG 24 hr tablet Take 50 mg by mouth daily.     Yes Historical Provider, MD  oxyCODONE-acetaminophen (PERCOCET/ROXICET) 5-325 MG per tablet Take 1 tablet by mouth every 6 (six) hours as needed.   Yes Historical Provider, MD   History   Social History  . Marital Status: Widowed    Spouse Name: N/A    Number of Children: N/A  . Years of Education: N/A   Occupational History  . Not on file.   Social History Main Topics  . Smoking status: Never Smoker   . Smokeless tobacco: Not on file  . Alcohol Use: No  . Drug Use: No  . Sexually Active: Not on file   Other Topics Concern  . Not on file   Social History Narrative  . No narrative on file   History reviewed. No pertinent family history. Physical Exam  Constitutional: She is oriented to person, place, and time. She appears well-developed and well-nourished. She appears distressed.  HENT:  Head: Normocephalic and atraumatic.  Nose: Nose normal.  Eyes: EOM are normal. Pupils are equal, round, and reactive to light.  Neck: Normal range of motion. Neck supple.  Cardiovascular: Normal rate, regular rhythm, normal heart sounds and intact distal pulses.   Pulmonary/Chest: Effort normal and breath sounds normal.  Abdominal: Soft. Bowel sounds are normal.  Neurological: She is alert and oriented to person, place, and time. She has normal reflexes. She displays normal reflexes. No cranial nerve deficit. She exhibits normal muscle tone. Coordination normal.  Skin: Skin is warm and dry.  Psychiatric: She has a normal mood and affect. Her behavior is normal. Judgment and thought content normal.   A/P New L1 compression fracture. She has had a kyphoplasty in the past and wishes to proceed with another at L1. There is no retropulsion and I do believe  she is a good candidate now. Risks and benefits no pain relief, spinal cord damage, weakness, bowel/bladder dysfunction, and other risks were discussed. She understands and wishes to proceed.

## 2012-09-05 NOTE — Anesthesia Procedure Notes (Signed)
Procedure Name: Intubation Date/Time: 09/05/2012 7:54 PM Performed by: Garen Lah Pre-anesthesia Checklist: Patient identified, Timeout performed, Emergency Drugs available, Suction available and Patient being monitored Patient Re-evaluated:Patient Re-evaluated prior to inductionOxygen Delivery Method: Circle system utilized Preoxygenation: Pre-oxygenation with 100% oxygen Intubation Type: IV induction Ventilation: Mask ventilation without difficulty Laryngoscope Size: Mac and 3 Grade View: Grade I Tube type: Oral Tube size: 7.0 mm Number of attempts: 1 Airway Equipment and Method: Stylet Placement Confirmation: ETT inserted through vocal cords under direct vision,  positive ETCO2 and breath sounds checked- equal and bilateral Secured at: 20 cm Tube secured with: Tape Dental Injury: Teeth and Oropharynx as per pre-operative assessment

## 2012-09-05 NOTE — Progress Notes (Signed)
1305   Called office to have orders signed.Marland KitchenMarland KitchenDA

## 2012-09-05 NOTE — Preoperative (Addendum)
Beta Blockers   Reason not to administer Beta Blockers:Not Applicable. Toprol @ 0800 09/05/12

## 2012-09-05 NOTE — Anesthesia Preprocedure Evaluation (Addendum)
Anesthesia Evaluation  Patient identified by MRN, date of birth, ID band Patient awake    Reviewed: Allergy & Precautions, H&P , NPO status , Patient's Chart, lab work & pertinent test results, reviewed documented beta blocker date and time   Airway Mallampati: I TM Distance: >3 FB Neck ROM: Full    Dental No notable dental hx. (+) Edentulous Upper and Dental Advisory Given   Pulmonary shortness of breath,  breath sounds clear to auscultation  Pulmonary exam normal       Cardiovascular hypertension, On Medications and On Home Beta Blockers + Peripheral Vascular Disease + dysrhythmias Rhythm:Regular Rate:Normal     Neuro/Psych negative neurological ROS  negative psych ROS   GI/Hepatic Neg liver ROS, GERD-  Controlled,  Endo/Other  Hypothyroidism   Renal/GU negative Renal ROS  negative genitourinary   Musculoskeletal   Abdominal   Peds  Hematology negative hematology ROS (+)   Anesthesia Other Findings   Reproductive/Obstetrics negative OB ROS                          Anesthesia Physical Anesthesia Plan  ASA: III  Anesthesia Plan: General   Post-op Pain Management:    Induction: Intravenous  Airway Management Planned: Oral ETT  Additional Equipment:   Intra-op Plan:   Post-operative Plan: Extubation in OR  Informed Consent: I have reviewed the patients History and Physical, chart, labs and discussed the procedure including the risks, benefits and alternatives for the proposed anesthesia with the patient or authorized representative who has indicated his/her understanding and acceptance.   Dental advisory given  Plan Discussed with: CRNA  Anesthesia Plan Comments:         Anesthesia Quick Evaluation

## 2012-09-05 NOTE — Transfer of Care (Signed)
Immediate Anesthesia Transfer of Care Note  Patient: Yvonne [name]  Procedure(s) Performed: Procedure(s) (LRB) with comments: KYPHOPLASTY (N/A) - L1 kyphoplasty  Patient Location: PACU  Anesthesia Type:General  Level of Consciousness: sedated  Airway & Oxygen Therapy: Patient Spontanous Breathing and Patient connected to nasal cannula oxygen  Post-op Assessment: Report given to PACU RN and Post -op Vital signs reviewed and stable  Post vital signs: Reviewed and stable  Complications: No apparent anesthesia complications

## 2012-09-06 ENCOUNTER — Encounter (HOSPITAL_COMMUNITY): Payer: Self-pay | Admitting: Neurosurgery

## 2012-09-06 NOTE — Progress Notes (Signed)
AVS and discharged instruction and education given to patient and daughter Tresa Endo both demonstrated good understanding  Incision site remained intact.no s/s of infection the patient condition at discharge home is stable.

## 2012-09-12 NOTE — Discharge Summary (Signed)
Physician Discharge Summary  Patient ID: LARAYA PESTKA MRN: [id number] DOB/AGE: 1925-12-24 79 y.o.  Admit date: 09/05/2012 Discharge date: 09/12/2012  Admission Diagnoses:l1 compression fracture   Discharge Diagnoses: L1 compression fracture Active Problems:  * No active hospital problems. *    Discharged Condition: good  Hospital Course: Mrs. Lajara underwent a Kyphoplasty at L1 without complication. Post op she was ambulating, voiding, and tolerating a regular diet. Her wound was clean, dry, and without signs of infection.   Consults: None  Significant Diagnostic Studies: none  Treatments: surgery: as above  Discharge Exam: Blood pressure 132/46, pulse 72, temperature 97.9 F (36.6 C), temperature source Oral, resp. rate 18, height 5\' 6"  (1.676 m), weight 55.1 kg (121 lb 7.6 oz), SpO2 93.00%. General appearance: alert, cooperative and appears stated age Neurologic: Alert and oriented X 3, normal strength and tone. Normal symmetric reflexes. Normal coordination and gait  Disposition: 01-Home or Self Care     Medication List     As of 09/12/2012  8:51 AM    TAKE these medications         aspirin 325 MG EC tablet   Take 325 mg by mouth daily.      flecainide 50 MG tablet   Commonly known as: TAMBOCOR   Take 50 mg by mouth 2 (two) times daily.      levothyroxine 50 MCG tablet   Commonly known as: SYNTHROID, LEVOTHROID   Take 50 mcg by mouth daily.      methocarbamol 500 MG tablet   Commonly known as: ROBAXIN   Take 500 mg by mouth 3 (three) times daily as needed.      metoprolol succinate 50 MG 24 hr tablet   Commonly known as: TOPROL-XL   Take 50 mg by mouth daily.      oxyCODONE-acetaminophen 5-325 MG per tablet   Commonly known as: PERCOCET/ROXICET   Take 1 tablet by mouth every 6 (six) hours as needed.           Follow-up Information    Follow up with Antwian Santaana L, MD. In 4 weeks. (call to make appt)    Contact information:   [address]                         [address] [place] [address] [phone]          Signed: Trypp Heckmann L 09/12/2012, 8:51 AM

## 2012-10-13 ENCOUNTER — Other Ambulatory Visit: Payer: Self-pay

## 2012-10-25 ENCOUNTER — Observation Stay (HOSPITAL_COMMUNITY)
Admission: EM | Admit: 2012-10-25 | Discharge: 2012-10-27 | Disposition: A | Discharge status: Skilled Nursing Facility | Payer: Medicare Other | Attending: Internal Medicine | Admitting: Internal Medicine

## 2012-10-25 ENCOUNTER — Encounter (HOSPITAL_COMMUNITY): Payer: Self-pay

## 2012-10-25 ENCOUNTER — Emergency Department (HOSPITAL_COMMUNITY): Payer: Medicare Other

## 2012-10-25 DIAGNOSIS — I10 Essential (primary) hypertension: Secondary | ICD-10-CM | POA: Insufficient documentation

## 2012-10-25 DIAGNOSIS — E039 Hypothyroidism, unspecified: Secondary | ICD-10-CM | POA: Insufficient documentation

## 2012-10-25 DIAGNOSIS — D72829 Elevated white blood cell count, unspecified: Secondary | ICD-10-CM | POA: Insufficient documentation

## 2012-10-25 DIAGNOSIS — S32591A Other specified fracture of right pubis, initial encounter for closed fracture: Secondary | ICD-10-CM

## 2012-10-25 DIAGNOSIS — I712 Thoracic aortic aneurysm, without rupture, unspecified: Secondary | ICD-10-CM | POA: Insufficient documentation

## 2012-10-25 DIAGNOSIS — I495 Sick sinus syndrome: Secondary | ICD-10-CM | POA: Insufficient documentation

## 2012-10-25 DIAGNOSIS — I498 Other specified cardiac arrhythmias: Secondary | ICD-10-CM

## 2012-10-25 DIAGNOSIS — S329XXA Fracture of unspecified parts of lumbosacral spine and pelvis, initial encounter for closed fracture: Secondary | ICD-10-CM

## 2012-10-25 DIAGNOSIS — Z79899 Other long term (current) drug therapy: Secondary | ICD-10-CM | POA: Insufficient documentation

## 2012-10-25 DIAGNOSIS — M25559 Pain in unspecified hip: Secondary | ICD-10-CM | POA: Insufficient documentation

## 2012-10-25 DIAGNOSIS — Z8679 Personal history of other diseases of the circulatory system: Secondary | ICD-10-CM

## 2012-10-25 DIAGNOSIS — S32509A Unspecified fracture of unspecified pubis, initial encounter for closed fracture: Principal | ICD-10-CM | POA: Insufficient documentation

## 2012-10-25 DIAGNOSIS — K219 Gastro-esophageal reflux disease without esophagitis: Secondary | ICD-10-CM | POA: Insufficient documentation

## 2012-10-25 DIAGNOSIS — I4891 Unspecified atrial fibrillation: Secondary | ICD-10-CM

## 2012-10-25 DIAGNOSIS — R296 Repeated falls: Secondary | ICD-10-CM | POA: Insufficient documentation

## 2012-10-25 DIAGNOSIS — S329XXD Fracture of unspecified parts of lumbosacral spine and pelvis, subsequent encounter for fracture with routine healing: Secondary | ICD-10-CM

## 2012-10-25 DIAGNOSIS — E8770 Fluid overload, unspecified: Secondary | ICD-10-CM

## 2012-10-25 DIAGNOSIS — I059 Rheumatic mitral valve disease, unspecified: Secondary | ICD-10-CM | POA: Insufficient documentation

## 2012-10-25 LAB — POCT I-STAT, CHEM 8
BUN: 19 mg/dL (ref 6–23)
Calcium, Ion: 1.2 mmol/L (ref 1.13–1.30)
Chloride: 98 mEq/L (ref 96–112)
Creatinine, Ser: 0.9 mg/dL (ref 0.50–1.10)
Glucose, Bld: 124 mg/dL — ABNORMAL HIGH (ref 70–99)
HCT: 40 % (ref 36.0–46.0)
Hemoglobin: 13.6 g/dL (ref 12.0–15.0)
Potassium: 4.6 mEq/L (ref 3.5–5.1)
Sodium: 133 mEq/L — ABNORMAL LOW (ref 135–145)
TCO2: 29 mmol/L (ref 0–100)

## 2012-10-25 LAB — CBC
HCT: 37.9 % (ref 36.0–46.0)
Hemoglobin: 12.5 g/dL (ref 12.0–15.0)
MCH: 30.5 pg (ref 26.0–34.0)
MCHC: 33 g/dL (ref 30.0–36.0)
MCV: 92.4 fL (ref 78.0–100.0)
Platelets: 166 10*3/uL (ref 150–400)
RBC: 4.1 MIL/uL (ref 3.87–5.11)
RDW: 15.6 % — ABNORMAL HIGH (ref 11.5–15.5)
WBC: 12 10*3/uL — ABNORMAL HIGH (ref 4.0–10.5)

## 2012-10-25 MED ORDER — ASPIRIN EC 325 MG PO TBEC: 325.0000 mg | DELAYED_RELEASE_TABLET | Freq: Every day | ORAL | Status: DC

## 2012-10-25 MED ORDER — METOPROLOL SUCCINATE ER 50 MG PO TB24
50.0000 mg | ORAL_TABLET | Freq: Every morning | ORAL | Status: DC
  Administered 2012-10-26: 50 mg via ORAL
  Filled 2012-10-25 (×2): qty 1

## 2012-10-25 MED ORDER — ACETAMINOPHEN 325 MG PO TABS: 650.0000 mg | ORAL_TABLET | Freq: Four times a day (QID) | ORAL | Status: DC | PRN

## 2012-10-25 MED ORDER — CALCIUM CARBONATE-VITAMIN D 500-200 MG-UNIT PO TABS
1.0000 | ORAL_TABLET | Freq: Every day | ORAL | Status: DC
  Administered 2012-10-26: 1 via ORAL
  Filled 2012-10-25 (×2): qty 1

## 2012-10-25 MED ORDER — ALUM & MAG HYDROXIDE-SIMETH 200-200-20 MG/5ML PO SUSP: 30.0000 mL | Freq: Four times a day (QID) | ORAL | Status: DC | PRN

## 2012-10-25 MED ORDER — MORPHINE SULFATE 4 MG/ML IJ SOLN
4.0000 mg | INTRAMUSCULAR | Status: DC | PRN
  Administered 2012-10-25: 4 mg via INTRAVENOUS
  Filled 2012-10-25: qty 1

## 2012-10-25 MED ORDER — ASPIRIN EC 325 MG PO TBEC
325.0000 mg | DELAYED_RELEASE_TABLET | Freq: Every day | ORAL | Status: DC
  Administered 2012-10-25 – 2012-10-26 (×2): 325 mg via ORAL
  Filled 2012-10-25 (×3): qty 1

## 2012-10-25 MED ORDER — FLECAINIDE ACETATE 50 MG PO TABS
50.0000 mg | ORAL_TABLET | Freq: Two times a day (BID) | ORAL | Status: DC
  Administered 2012-10-25 – 2012-10-27 (×4): 50 mg via ORAL
  Filled 2012-10-25 (×5): qty 1

## 2012-10-25 MED ORDER — MORPHINE SULFATE 4 MG/ML IJ SOLN: 4.0000 mg | INTRAMUSCULAR | Status: DC | PRN

## 2012-10-25 MED ORDER — POLYETHYLENE GLYCOL 3350 17 G PO PACK
17.0000 g | PACK | ORAL | Status: DC
  Administered 2012-10-25 – 2012-10-27 (×2): 17 g via ORAL

## 2012-10-25 MED ORDER — LEVOTHYROXINE SODIUM 50 MCG PO TABS
50.0000 ug | ORAL_TABLET | Freq: Every day | ORAL | Status: DC
  Administered 2012-10-26 – 2012-10-27 (×2): 50 ug via ORAL
  Filled 2012-10-25 (×3): qty 1

## 2012-10-25 MED ORDER — OXYCODONE HCL 5 MG PO TABS
5.0000 mg | ORAL_TABLET | ORAL | Status: DC | PRN
  Administered 2012-10-26 – 2012-10-27 (×5): 5 mg via ORAL
  Filled 2012-10-25 (×5): qty 1

## 2012-10-25 MED ORDER — ONDANSETRON HCL 4 MG PO TABS
4.0000 mg | ORAL_TABLET | Freq: Four times a day (QID) | ORAL | Status: DC | PRN
  Administered 2012-10-26: 4 mg via ORAL
  Filled 2012-10-25: qty 1

## 2012-10-25 MED ORDER — ACETAMINOPHEN 650 MG RE SUPP: 650.0000 mg | Freq: Four times a day (QID) | RECTAL | Status: DC | PRN

## 2012-10-25 MED ORDER — DOCUSATE SODIUM 100 MG PO CAPS
100.0000 mg | ORAL_CAPSULE | Freq: Two times a day (BID) | ORAL | Status: DC | PRN
  Administered 2012-10-25: 100 mg via ORAL

## 2012-10-25 MED ORDER — ONDANSETRON HCL 4 MG/2ML IJ SOLN: 4.0000 mg | Freq: Four times a day (QID) | INTRAMUSCULAR | Status: DC | PRN

## 2012-10-25 MED ORDER — ENOXAPARIN SODIUM 30 MG/0.3ML ~~LOC~~ SOLN
30.0000 mg | SUBCUTANEOUS | Status: DC
  Administered 2012-10-25 – 2012-10-26 (×2): 30 mg via SUBCUTANEOUS
  Filled 2012-10-25 (×3): qty 0.3

## 2012-10-25 MED ORDER — OXYCODONE-ACETAMINOPHEN 5-325 MG PO TABS
1.0000 | ORAL_TABLET | Freq: Four times a day (QID) | ORAL | Status: DC | PRN
Start: 2012-10-25 — End: 2012-10-27
  Administered 2012-10-25 – 2012-10-26 (×3): 1 via ORAL
  Filled 2012-10-25 (×3): qty 1

## 2012-10-25 MED ORDER — VITAMIN D3 25 MCG (1000 UNIT) PO TABS
1000.0000 [IU] | ORAL_TABLET | Freq: Every day | ORAL | Status: DC
  Administered 2012-10-26: 1000 [IU] via ORAL
  Filled 2012-10-25 (×2): qty 1

## 2012-10-25 MED ORDER — METHOCARBAMOL 500 MG PO TABS
500.0000 mg | ORAL_TABLET | Freq: Three times a day (TID) | ORAL | Status: DC | PRN
  Administered 2012-10-25 – 2012-10-27 (×5): 500 mg via ORAL
  Filled 2012-10-25 (×5): qty 1

## 2012-10-25 MED ORDER — ZOLPIDEM TARTRATE 5 MG PO TABS: 5.0000 mg | ORAL_TABLET | Freq: Every evening | ORAL | Status: DC | PRN

## 2012-10-25 MED ORDER — DOCUSATE SODIUM 100 MG PO CAPS
100.0000 mg | ORAL_CAPSULE | Freq: Two times a day (BID) | ORAL | Status: DC
  Administered 2012-10-25 – 2012-10-27 (×4): 100 mg via ORAL

## 2012-10-25 MED ORDER — ONDANSETRON HCL 4 MG/2ML IJ SOLN
4.0000 mg | Freq: Once | INTRAMUSCULAR | Status: AC
  Administered 2012-10-25: 4 mg via INTRAVENOUS
  Filled 2012-10-25: qty 2

## 2012-10-25 NOTE — ED Provider Notes (Signed)
History     CSN: [id number]  Arrival date & time 10/25/12  1614   First MD Initiated Contact with Patient 10/25/12 1638      Chief Complaint  Patient presents with  . Fall  . Hip Pain    HPI Pt is an 79 yo F presenting with right hip pain s/p fall at home. She states she was on the computer in her bedroom, when she stood up her foot got caught on the leg of the desk and she fell. Her posterior right hip took majority of impact. She was able to crawl to call for help, but she has not been able to bear full weight. She states at rest her pain is a 2/10, but 9/10 with any movement. She denies increased incidence of recent falls. She is independent at home. She denies LOC, SOB, CP, loss of bowel or bladder.   Past Medical History  Diagnosis Date  . Thoracic aortic aneurysm     Ruptured ascending  . History of atrial paroxysmal tachycardia   . History of hypothyroidism   . Compression fracture of L2 lumbar vertebra   . Acute blood loss anemia   . Volume overload   . Hypertension   . Mitral valve prolapse   . Hypothyroidism   . Afib     -no   . History of blood transfusion   . Shortness of breath     with  exertion  . UTI (lower urinary tract infection)   . GERD (gastroesophageal reflux disease)   . Arthritis     Past Surgical History  Procedure Laterality Date  . Revision of left total hip replacement using some allograft  11/23/2010  . Classic atrioventricular nodal reentrant  10/20/2010  . L2 kyphoplasty using kyphon system.  03/17/2009  . Replacement of ascending aorta and hemiarch and resuspension of the aortic valve with circulatory cardiopulmonary bypass and circulatory arrest  06/25/2010    Dr Tyrone Sage  . Ablation    . Tubal ligation  1966  . Tonsillectomy    . Appendectomy    . Hernia repair  1993    Right Inguinal  . Eye surgery  2003    Bil  . Toe osteotomy      Right 2nd   . Percutaneous pinning wrist fracture  1995    bil  . Breast surgery      right  biospy  . Kyphoplasty  09/05/2012    Procedure: KYPHOPLASTY;  Surgeon: Carmela Hurt, MD;  Location: [facility];  Service: Neurosurgery;  Laterality: N/A;  L1 kyphoplasty    No family history on file.  History  Substance Use Topics  . Smoking status: Never Smoker   . Smokeless tobacco: Never Used  . Alcohol Use: No    OB History   Grav Para Term Preterm Abortions TAB SAB Ect Mult Living                  Review of Systems  Constitutional: Negative for fever and chills.  HENT: Negative for neck stiffness.   Respiratory: Negative for shortness of breath.   Cardiovascular: Negative for chest pain.  Gastrointestinal: Negative for abdominal pain.  Musculoskeletal: Positive for back pain, arthralgias and gait problem.  Skin: Negative for rash and wound.  Neurological: Negative for light-headedness and headaches.  All other systems reviewed and are negative.    Allergies  Review of patient's allergies indicates no known allergies.  Home Medications   Current Outpatient Rx  Name  Route  Sig  Dispense  Refill  . aspirin 325 MG EC tablet   Oral   Take 325 mg by mouth at bedtime.          . calcium-vitamin D (OSCAL WITH D) 500-200 MG-UNIT per tablet   Oral   Take 1 tablet by mouth daily at 12 noon.         . cholecalciferol (VITAMIN D) 1000 UNITS tablet   Oral   Take 1,000 Units by mouth daily at 12 noon.         . docusate sodium (COLACE) 100 MG capsule   Oral   Take 100 mg by mouth 2 (two) times daily as needed (constipation).         . flecainide (TAMBOCOR) 50 MG tablet   Oral   Take 50 mg by mouth 2 (two) times daily.          Marland Kitchen levothyroxine (SYNTHROID, LEVOTHROID) 50 MCG tablet   Oral   Take 50 mcg by mouth every morning.          . methocarbamol (ROBAXIN) 500 MG tablet   Oral   Take 500 mg by mouth 3 (three) times daily as needed (muscle spasms).          . metoprolol (TOPROL-XL) 50 MG 24 hr tablet   Oral   Take 50 mg by mouth every  morning.          Marland Kitchen oxyCODONE-acetaminophen (PERCOCET/ROXICET) 5-325 MG per tablet   Oral   Take 1 tablet by mouth every 6 (six) hours as needed (pain).          . polyethylene glycol (MIRALAX / GLYCOLAX) packet   Oral   Take 17 g by mouth every other day.           BP 168/55  Pulse 61  Temp(Src) 97.7 F (36.5 C) (Oral)  Resp 20  SpO2 94%  Physical Exam  Constitutional: She is oriented to person, place, and time. She appears well-developed and well-nourished. No distress.  HENT:  Head: Normocephalic and atraumatic.  Mouth/Throat: Oropharynx is clear and moist. No oropharyngeal exudate.  Upper dental partials  Eyes: Conjunctivae and EOM are normal. Pupils are equal, round, and reactive to light.  Neck: Neck supple.  Cardiovascular: Normal rate, regular rhythm, normal heart sounds and intact distal pulses.   Pulmonary/Chest: Effort normal and breath sounds normal. She has no wheezes.  Abdominal: Soft. There is no tenderness.  Musculoskeletal:  Right leg shortened, but not rotated. Unable to move right leg secondary to pain. Neurovascularly intact distally.  Lymphadenopathy:    She has no cervical adenopathy.  Neurological: She is alert and oriented to person, place, and time. No cranial nerve deficit. Coordination normal.  Skin: Skin is warm and dry. No rash noted. She is not diaphoretic.    ED Course  Procedures (including critical care time)  Labs Reviewed  CBC - Abnormal; Notable for the following:    WBC 12.0 (*)    RDW 15.6 (*)    All other components within normal limits  POCT I-STAT, CHEM 8 - Abnormal; Notable for the following:    Sodium 133 (*)    Glucose, Bld 124 (*)    All other components within normal limits   Dg Hip Complete Right  10/25/2012  *RADIOLOGY REPORT*  Clinical Data: Fall.  Hip pain.  RIGHT HIP - COMPLETE 2+ VIEW  Comparison: 11/23/2010  Findings: There is a comminuted fracture involving the right  superior pubic ramus.  There is also a  fracture involving the inferior pubic rami on the right.  Previous left hip arthroplasty is identified.  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 Mild degenerative changes involve the right hip.  IMPRESSION:  1.  Acute fractures involve the right superior and inferior pubic rami.   Original Report Authenticated By: Signa Kell, M.D.     1. Fracture of multiple pubic rami, right, closed, initial encounter     MDM  79 yo F with fall from standing at home, now with acute right hip pain.  Will check right hip X-rays. Send CBC and [employer] as well. Morphine and Zofran for pain control at this time.  1813- X-ray reviewed. Spoke with Dr. Luiz Blare via telephone who recommends non-surgical treatment with pain control and weight bearing as tolerated. Discussed findings with patient and daughter. Daughter states that with patient recent back surgery as well as her current pain, that she cannot be managed at home since she will be alone. Given patient's condition she would greatly benefit from hospital admission for pain control, PT/OT and possible rehab facility placement. Will consult hospitalist for admission. [facility] Team 8 will admit.  Hilarie Fredrickson, MD 10/25/12 [phone]

## 2012-10-25 NOTE — Progress Notes (Signed)
[facility] ED CM noted pt without pcp listed Spoke with pt, daughter and female family member who confirmed pcp as Docia Chuck, [facility] EPIC updated

## 2012-10-25 NOTE — ED Notes (Signed)
Patient was sitting and got up to walk, tripping on the leg of a chair or desk. Patient unable to bear weight on right hip/leg.

## 2012-10-25 NOTE — ED Notes (Signed)
Attempted to call report. Nurse will call back.

## 2012-10-25 NOTE — ED Notes (Signed)
YNW:[id number]> Expected date:<BR> Expected time:<BR> Means of arrival:<BR> Comments:<BR> Rt buttocks pain and hip pain

## 2012-10-25 NOTE — Progress Notes (Addendum)
Clinical Social Work Department CLINICAL SOCIAL WORK PLACEMENT NOTE 10/25/2012  Patient:  Yvonne [name], Yvonne [name]  Account Number:  [id number] Admit date:  10/25/2012  Clinical Social Worker:  Doree Albee  Date/time:  10/25/2012 08:23 PM  Clinical Social Work is seeking post-discharge placement for this patient at the following level of care:   SKILLED NURSING   (*CSW will update this form in Epic as items are completed)   10/25/2012  Patient/family provided with [facility] Department of Clinical Social Work's list of facilities offering this level of care within the geographic area requested by the patient (or if unable, by the patient's family).  10/25/2012  Patient/family informed of their freedom to choose among providers that offer the needed level of care, that participate in Medicare, Medicaid or managed care program needed by the patient, have an available bed and are willing to accept the patient.  10/25/2012  Patient/family informed of [facility]' ownership interest in [facility], as well as of the fact that they are under no obligation to receive care at this facility.  PASARR submitted to EDS on 10/25/2012 PASARR number received from EDS on 10/25/2012  FL2 transmitted to all facilities in geographic area requested by pt/family on  10/25/2012 FL2 transmitted to all facilities within larger geographic area on   Patient informed that his/her managed care company has contracts with or will negotiate with  certain facilities, including the following:     Patient/family informed of bed offers received:  10/26/12 Patient chooses bed at [facility] Physician recommends and patient chooses bed at    Patient to be transferred to [facility]  on  10/27/12 Patient to be transferred to facility by [facility]  The following physician request were entered in Epic:   Additional Comments: Pt family interested in [place], [place], and [facility].  Catha Gosselin, LCSWA  [phone] .10/25/2012 2026pm

## 2012-10-25 NOTE — H&P (Addendum)
[facility]                                                                                    Patient Demographics  Yvonne [name], is a 79 y.o. female  CSN: [id number]  MRN: [id number]  DOB - [birth year]  Admit Date - 10/25/2012  Outpatient Primary MD for the patient is [name], MD   With History of -  Past Medical History  Diagnosis Date  . Thoracic aortic aneurysm     Ruptured ascending  . History of atrial paroxysmal tachycardia   . History of hypothyroidism   . Compression fracture of L2 lumbar vertebra   . Acute blood loss anemia   . Volume overload   . Hypertension   . Mitral valve prolapse   . Hypothyroidism   . Afib     -no   . History of blood transfusion   . Shortness of breath     with  exertion  . UTI (lower urinary tract infection)   . GERD (gastroesophageal reflux disease)   . Arthritis       Past Surgical History  Procedure Laterality Date  . Revision of left total hip replacement using some allograft  11/23/2010  . Classic atrioventricular nodal reentrant  10/20/2010  . L2 kyphoplasty using kyphon system.  03/17/2009  . Replacement of ascending aorta and hemiarch and resuspension of the aortic valve with circulatory cardiopulmonary bypass and circulatory arrest  06/25/2010    Dr Tyrone Sage  . Ablation    . Tubal ligation  1966  . Tonsillectomy    . Appendectomy    . Hernia repair  1993    Right Inguinal  . Eye surgery  2003    Bil  . Toe osteotomy      Right 2nd   . Percutaneous pinning wrist fracture  1995    bil  . Breast surgery      right biospy  . Kyphoplasty  09/05/2012    Procedure: KYPHOPLASTY;  Surgeon: Carmela Hurt, MD;  Location: [facility];  Service: Neurosurgery;  Laterality: N/A;  L1 kyphoplasty    in for   Chief Complaint  Patient presents with  . Fall  . Hip Pain     HPI  Yvonne [name]  is a 79 y.o. female, who usually stays at home with her son had before that 30 p.m. today after she  tripped. Patient denies a syncopal attack, chest pains, palpitations, previous headaches, dizziness or vertigo. Patient had a right hip replaced and revised and also had a kyphoplasty in January 8 of this year.    Review of Systems    In addition to the HPI above, No Fever-chills, No Headache, No changes with Vision or hearing, No problems swallowing food or Liquids, No Chest pain, Cough or Shortness of Breath, No Abdominal pain, No Nausea or Vommitting, Bowel movements are regular, No Blood in stool or Urine, No dysuria, No new skin rashes or bruises, Right hip pain,  No new weakness, tingling, numbness in any extremity, No recent weight gain or loss, No polyuria, polydypsia or polyphagia, No significant Mental Stressors.  A full 10 point Review of  Systems was done, except as stated above, all other Review of Systems were negative.   Social History History  Substance Use Topics  . Smoking status: Never Smoker   . Smokeless tobacco: Never Used  . Alcohol Use: No     Family History Significant for CVA hypertension and congestive heart failure  Prior to Admission medications   Medication Sig Start Date End Date Taking? Authorizing Provider  aspirin 325 MG EC tablet Take 325 mg by mouth at bedtime.    Yes Historical Provider, MD  calcium-vitamin D (OSCAL WITH D) 500-200 MG-UNIT per tablet Take 1 tablet by mouth daily at 12 noon.   Yes Historical Provider, MD  cholecalciferol (VITAMIN D) 1000 UNITS tablet Take 1,000 Units by mouth daily at 12 noon.   Yes Historical Provider, MD  docusate sodium (COLACE) 100 MG capsule Take 100 mg by mouth 2 (two) times daily as needed (constipation).   Yes Historical Provider, MD  flecainide (TAMBOCOR) 50 MG tablet Take 50 mg by mouth 2 (two) times daily.  05/11/12  Yes Historical Provider, MD  levothyroxine (SYNTHROID, LEVOTHROID) 50 MCG tablet Take 50 mcg by mouth every morning.    Yes Historical Provider, MD  methocarbamol (ROBAXIN) 500 MG  tablet Take 500 mg by mouth 3 (three) times daily as needed (muscle spasms).    Yes Historical Provider, MD  metoprolol (TOPROL-XL) 50 MG 24 hr tablet Take 50 mg by mouth every morning.    Yes Historical Provider, MD  oxyCODONE-acetaminophen (PERCOCET/ROXICET) 5-325 MG per tablet Take 1 tablet by mouth every 6 (six) hours as needed (pain).    Yes Historical Provider, MD  polyethylene glycol (MIRALAX / GLYCOLAX) packet Take 17 g by mouth every other day.   Yes Historical Provider, MD    No Known Allergies  Physical Exam  Vitals  Blood pressure 137/55, pulse 65, temperature 97.7 F (36.5 C), temperature source Oral, resp. rate 20, SpO2 96.00%.   1. General elderly white female, very pleasant, in pain  2. Normal affect and insight, Not Suicidal or Homicidal, Awake Alert, Oriented X 3.  3. No F.N deficits, ALL C.Nerves Intact, Strength 5/5 all 4 extremities, Sensation intact all 4 extremities, Plantars down going.  4. Ears and Eyes appear Normal, Conjunctivae clear, PERRLA. Moist Oral Mucosa.  5. Supple Neck, No JVD, No cervical lymphadenopathy appriciated, No Carotid Bruits.  6. Symmetrical Chest wall movement, Good air movement bilaterally, CTAB.  7. RRR, No Gallops, Rubs or Murmurs, No Parasternal Heave.  8. Positive Bowel Sounds, Abdomen Soft, Non tender, No organomegaly appriciated,No rebound -guarding or rigidity.  9.  No Cyanosis, Normal Skin Turgor, No Skin Rash or Bruise.  10. Good muscle tone,  severe pain when tried to touch the right hip area and any kind of movement  11. No Palpable Lymph Nodes in Neck or Axillae     Data Review  CBC  Recent Labs Lab 10/25/12 1654 10/25/12 1703  WBC 12.0*  --   HGB 12.5 13.6  HCT 37.9 40.0  PLT 166  --   MCV 92.4  --   MCH 30.5  --   MCHC 33.0  --   RDW 15.6*  --    ------------------------------------------------------------------------------------------------------------------  Chemistries   Recent Labs Lab  10/25/12 1703  NA 133*  K 4.6  CL 98  GLUCOSE 124*  BUN 19  CREATININE 0.90   ------------------------------------------------------------------------------------------------------------------   ---------------------------------------------------------------------------------------------------------------  Urinalysis    Component Value Date/Time   COLORURINE AMBER BIOCHEMICALS MAY BE AFFECTED  BY COLOR* 11/27/2010 1302   APPEARANCEUR CLOUDY* 11/27/2010 1302   LABSPEC 1.023 11/27/2010 1302   PHURINE 6.5 11/27/2010 1302   GLUCOSEU NEGATIVE 11/27/2010 1302   HGBUR NEGATIVE 11/27/2010 1302   BILIRUBINUR NEGATIVE 11/27/2010 1302   KETONESUR NEGATIVE 11/27/2010 1302   PROTEINUR 30* 11/27/2010 1302   UROBILINOGEN 0.2 11/27/2010 1302   NITRITE NEGATIVE 11/27/2010 1302   LEUKOCYTESUR NEGATIVE 11/27/2010 1302    ----------------------------------------------------------------------------------------------------------------    Imaging results:   Dg Hip Complete Right  10/25/2012  *RADIOLOGY REPORT*  Clinical Data: Fall.  Hip pain.  RIGHT HIP - COMPLETE 2+ VIEW  Comparison: 11/23/2010  Findings: There is a comminuted fracture involving the right superior pubic ramus.  There is also a fracture involving the inferior pubic rami on the right.  Previous left hip arthroplasty is identified.  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 Mild degenerative changes involve the right hip.  IMPRESSION:  1.  Acute fractures involve the right superior and inferior pubic rami.   Original Report Authenticated By: Signa Kell, M.D.       Assessment & Plan  1. pelvic fracture with uncontrolled pain 2. Mild hyponatremia 3. History of sick sinus syndrome status post cardiac ablation 4. History of osteoarthritis 5. History of hypertension, aortic insufficiency, mild pulmonary hypertension and MVP 6. Status post kyphoplasty in January 8 of this year, left hip replacement and revision, cardiac ablation  in the past 7. History of ruptured ascending AA STATUS post surgery in the past.  Plan Admit for pain control Physical therapy evaluation and treatment Consult social worker for placement in a.m.   DVT Prophylaxis Lovenox  AM Labs Ordered, also please review Full Orders  Family Communication: Admission, patients condition and plan of care including tests being ordered have been discussed with the patient and daughter who indicate understanding and agree with the plan and Code Status.  Code Status full  Disposition Plan: Discharge home with home health or rehabilitation facility  Time spent in minutes : 45 minutes  Condition GUARDED

## 2012-10-25 NOTE — Progress Notes (Signed)
Clinical Social Work Department BRIEF PSYCHOSOCIAL ASSESSMENT 10/25/2012  Patient:  Yvonne [name], Yvonne [name]     Account Number:  [id number]     Admit date:  10/25/2012  Clinical Social Worker:  Doree Albee  Date/Time:  10/25/2012 12:00 M  Referred by:  CSW  Date Referred:  10/25/2012 Referred for  SNF Placement   Other Referral:   Interview type:  Patient Other interview type:   patient daughter, Yvonne [name]    PSYCHOSOCIAL DATA Living Status:  FAMILY Admitted from facility:   Level of care:   Primary support name:  Yvonne [name] [phone] Primary support relationship to patient:  CHILD, ADULT Degree of support available:   strong    CURRENT CONCERNS Current Concerns  Post-Acute Placement   Other Concerns:    SOCIAL WORK ASSESSMENT / PLAN CSW met with pt, pt dtr, and pt son in law to complete psychosocial assessment. CSW introdcued self and csw role. Pt is alert and orientec x4 resting comfortably in bed. Pt and patient daughter shared that she lives at home with pt daughter, however pt daughter works full time. Pt daughter is an Charity fundraiser for [employer].    Pt and pt daughter shared interested in skilled nursing facility placement, assisted living, or home with home health services depending upon recommendations from physical therapy. Pt is also interested in [facility] if patient is recommended. Pt has been to CIR twice, however due to pain is unsure if she would be appropriate.    Patient stated that she has a long term care policy if needed. Pt daughter to obtain information and provide to unit csw for assistnace with placement if needed.    CSW, pt , and pt dtr discussed snf placement process and list. Pt and pt daughter interested in [facility], [facility], and [facility]. CSW will initiate snf placement process and follow up with pt with availiability.    Please see placement note for progress of placement.    CSW will complete fl2 for md signature and place in  patient shadow chart once completed   Assessment/plan status:  Psychosocial Support/Ongoing Assessment of Needs Other assessment/ plan:   Information/referral to community resources:   skilled nursing facilities    PATIENT'S/FAMILY'S RESPONSE TO PLAN OF CARE: Pt and pt daughter thanked csw for concern and support. Pt is motivated to discharge to snf/alf/or home with home health services as recommended by physical therapy.      Catha Gosselin, LCSWA  [phone] 10/25/2012. 1910pm

## 2012-10-26 ENCOUNTER — Inpatient Hospital Stay (HOSPITAL_COMMUNITY): Payer: Medicare Other

## 2012-10-26 DIAGNOSIS — S32509A Unspecified fracture of unspecified pubis, initial encounter for closed fracture: Secondary | ICD-10-CM

## 2012-10-26 DIAGNOSIS — I4891 Unspecified atrial fibrillation: Secondary | ICD-10-CM

## 2012-10-26 LAB — URINALYSIS, ROUTINE W REFLEX MICROSCOPIC
Bilirubin Urine: NEGATIVE
Ketones, ur: NEGATIVE mg/dL
Nitrite: POSITIVE — AB
pH: 6 (ref 5.0–8.0)

## 2012-10-26 LAB — URINE MICROSCOPIC-ADD ON

## 2012-10-26 MED ORDER — PANTOPRAZOLE SODIUM 40 MG PO TBEC
40.0000 mg | DELAYED_RELEASE_TABLET | Freq: Every day | ORAL | Status: DC
  Administered 2012-10-26 – 2012-10-27 (×2): 40 mg via ORAL
  Filled 2012-10-26 (×3): qty 1

## 2012-10-26 MED ORDER — DSS 100 MG PO CAPS: 100.0000 mg | ORAL_CAPSULE | Freq: Two times a day (BID) | ORAL | Status: DC

## 2012-10-26 MED ORDER — BOOST PLUS PO LIQD
237.0000 mL | Freq: Two times a day (BID) | ORAL | Status: DC
  Administered 2012-10-26 – 2012-10-27 (×3): 237 mL via ORAL
  Filled 2012-10-26 (×4): qty 237

## 2012-10-26 MED ORDER — OXYCODONE HCL 5 MG PO TABS: 5.0000 mg | ORAL_TABLET | ORAL | Status: DC | PRN

## 2012-10-26 NOTE — Evaluation (Signed)
Physical Therapy Evaluation Patient Details Name: Yvonne [name] MRN: [id number] DOB: July 14, 1926 Today's Date: 10/26/2012 Time: 4098-1191 PT Time Calculation (min): 40 min  PT Assessment / Plan / Recommendation Clinical Impression  Pt s/p pelvic fx presents with functional mobility severely limited by pain with movement and inability to tolerate WB on R LE    PT Assessment  Patient needs continued PT services    Follow Up Recommendations  SNF    Does the patient have the potential to tolerate intense rehabilitation      Barriers to Discharge Decreased caregiver support      Equipment Recommendations  None recommended by PT    Recommendations for Other Services     Frequency 7X/week    Precautions / Restrictions Precautions Precautions: Fall Restrictions Weight Bearing Restrictions: No Other Position/Activity Restrictions: WBAT   Pertinent Vitals/Pain 4/10; premed, RN aware.      Mobility  Bed Mobility Bed Mobility: Supine to Sit;Sit to Supine Supine to Sit: 1: +2 Total assist Supine to Sit: Patient Percentage: 50% Sit to Supine: 1: +2 Total assist Sit to Supine: Patient Percentage: 50% Details for Bed Mobility Assistance: cues for sequence and use of L LE to self assist Transfers Transfers: Sit to Stand;Stand to Sit Sit to Stand: 1: +2 Total assist;From bed;With upper extremity assist Sit to Stand: Patient Percentage: 50% Stand to Sit: 1: +2 Total assist;To bed;With upper extremity assist Stand to Sit: Patient Percentage: 50% Details for Transfer Assistance: cues for use of UEs to self assist and LE positioning Ambulation/Gait Ambulation/Gait Assistance:  (Pt stood twice but unable to initiate step 2* pain)    Exercises     PT Diagnosis: Difficulty walking;Acute pain  PT Problem List: Decreased strength;Decreased range of motion;Decreased activity tolerance;Decreased mobility;Decreased knowledge of use of DME;Pain PT Treatment Interventions: DME  instruction;Gait training;Functional mobility training;Therapeutic activities;Therapeutic exercise;Patient/family education   PT Goals Acute Rehab PT Goals PT Goal Formulation: With patient Time For Goal Achievement: 10/31/12 Potential to Achieve Goals: Fair Pt will go Supine/Side to Sit: with min assist PT Goal: Supine/Side to Sit - Progress: Goal set today Pt will go Sit to Supine/Side: with min assist PT Goal: Sit to Supine/Side - Progress: Goal set today Pt will go Sit to Stand: with min assist PT Goal: Sit to Stand - Progress: Goal set today Pt will go Stand to Sit: with min assist PT Goal: Stand to Sit - Progress: Goal set today Pt will Ambulate: 1 - 15 feet;with min assist;with rolling walker PT Goal: Ambulate - Progress: Goal set today  Visit Information  Last PT Received On: 10/26/12 Assistance Needed: +2    Subjective Data  Subjective: I'll try but you have to move at my speed, I can't move at yours Patient Stated Goal: Rehab and then home with her daughter   Prior Functioning  Home Living Lives With: Daughter Available Help at Discharge: Family (24/7 not available) Prior Function Level of Independence: Independent with assistive device(s);Independent Able to Take Stairs?: Yes Driving: Yes Vocation: Retired Musician: No difficulties    Copywriter, advertising Overall Cognitive Status: Appears within functional limits for tasks assessed/performed Arousal/Alertness: Awake/alert Orientation Level: Appears intact for tasks assessed Behavior During Session: [facility] for tasks performed    Extremity/Trunk Assessment Right Upper Extremity Assessment RUE ROM/Strength/Tone: [facility] for tasks assessed Left Upper Extremity Assessment LUE ROM/Strength/Tone: WFL for tasks assessed Right Lower Extremity Assessment RLE ROM/Strength/Tone: Deficits RLE ROM/Strength/Tone Deficits: discomfort with all movement - pt able to move hip to  80 flex and min abd with min  assist Left Lower Extremity Assessment LLE ROM/Strength/Tone: Deficits LLE ROM/Strength/Tone Deficits: ROM ltd to abd 2* discomfort - strength WFL   Balance    End of Session PT - End of Session Equipment Utilized During Treatment: Gait belt Activity Tolerance: Patient limited by pain;Patient limited by fatigue Patient left: in bed;with call bell/phone within reach;with family/visitor present Nurse Communication: Mobility status  GP Functional Assessment Tool Used: clinical judgement Functional Limitation: Mobility: Walking and moving around Mobility: Walking and Moving Around Current Status (Z6109): At least 60 percent but less than 80 percent impaired, limited or restricted Mobility: Walking and Moving Around Goal Status [phone]): At least 40 percent but less than 60 percent impaired, limited or restricted   Vasilios Ottaway 10/26/2012, 1:06 PM

## 2012-10-26 NOTE — Discharge Summary (Addendum)
[facility]                                                                                   Yvonne [name], is a 79 y.o. female  DOB 19-Aug-1926  MRN [id number].  Admission date:  10/25/2012  Discharge Date:  10/27/2012  Primary MD  [name], MD  Admitting Physician  Carron Curie, MD  Admission Diagnosis  Unspecified essential hypertension [401.9] Other specified cardiac dysrhythmias [427.89] Thoracic aortic aneurysm [441.2] Fracture of multiple pubic rami, right, closed, initial encounter [808.2]  Discharge Diagnosis     Principal Problem:   Pelvic fracture   Past Medical History  Diagnosis Date  . Thoracic aortic aneurysm     Ruptured ascending  . History of atrial paroxysmal tachycardia   . History of hypothyroidism   . Compression fracture of L2 lumbar vertebra   . Acute blood loss anemia   . Volume overload   . Hypertension   . Mitral valve prolapse   . Hypothyroidism   . Afib     -no   . History of blood transfusion   . Shortness of breath     with  exertion  . UTI (lower urinary tract infection)   . GERD (gastroesophageal reflux disease)   . Arthritis     Past Surgical History  Procedure Laterality Date  . Revision of left total hip replacement using some allograft  11/23/2010  . Classic atrioventricular nodal reentrant  10/20/2010  . L2 kyphoplasty using kyphon system.  03/17/2009  . Replacement of ascending aorta and hemiarch and resuspension of the aortic valve with circulatory cardiopulmonary bypass and circulatory arrest  06/25/2010    Dr Tyrone Sage  . Ablation    . Tubal ligation  1966  . Tonsillectomy    . Appendectomy    . Hernia repair  1993    Right Inguinal  . Eye surgery  2003    Bil  . Toe osteotomy      Right 2nd   . Percutaneous pinning wrist fracture  1995    bil  . Breast surgery      right biospy  . Kyphoplasty  09/05/2012    Procedure: KYPHOPLASTY;  Surgeon: Carmela Hurt, MD;  Location: [facility];   Service: Neurosurgery;  Laterality: N/A;  L1 kyphoplasty     Recommendations for primary care physician for things to follow:     Discharge Diagnoses:   Principal Problem:   Pelvic fracture    Discharge Condition: stable   Diet recommendation: See Discharge Instructions below   Consults Ortho Oiln    History of present illness and  Hospital Course:     Kindly see H&P for history of present illness and admission details, please review complete Labs, Consult reports and Test reports for all details in brief Yvonne [name], is a 79 y.o. female, patient was admitted for trip and fall causing pelvic fracture, patient was seen by orthopedic physician Dr.:, Was admitted for conservative medical treatment with pain control and PT, still having considerable pain on weightbearing and will require placement to a rehabilitation. Outpatient monitoring and followup by Dr. Marita Kansas thereafter   Mild reactionary  leukocytosis with stable chest x-ray & UA, stable clinically, repeat CBC in 3-4 days.  For her history of , sick sinus syndrome, hypertension, AV insufficiency, mild pulmonary hypertension and mitral valve prolapse outpatient followup with her cardiologist post discharge.    Patient had recent L1 kyphoplasty by Dr. Mikal Plane for which she should follow up with him in 2-3 weeks post discharge.   Today   Subjective:   Yvonne [name] today has no headache,no chest abdominal pain,no new weakness tingling or numbness, feels much better wants to go home today.   Objective:   Blood pressure 109/56, pulse 64, temperature 98.7 F (37.1 C), temperature source Oral, resp. rate 14, height 5\' 6"  (1.676 m), weight 52.164 kg (115 lb), SpO2 93.00%.   Intake/Output Summary (Last 24 hours) at 10/27/12 0936 Last data filed at 10/27/12 0733  Gross per 24 hour  Intake    720 ml  Output    800 ml  Net    -80 ml    Exam Awake Alert, Oriented *3, No new F.N deficits, Normal  affect [place].AT,PERRAL Supple Neck,No JVD, No cervical lymphadenopathy appriciated.  Symmetrical Chest wall movement, Good air movement bilaterally, CTAB RRR,No Gallops,Rubs or new Murmurs, No Parasternal Heave +ve B.Sounds, Abd Soft, Non tender, No organomegaly appriciated, No rebound -guarding or rigidity. No Cyanosis, Clubbing or edema, No new Rash or bruise  Data Review   Major procedures and Radiology Reports - PLEASE review detailed and final reports for all details in brief -     Dg Hip Complete Right  10/25/2012  *RADIOLOGY REPORT*  Clinical Data: Fall.  Hip pain.  RIGHT HIP - COMPLETE 2+ VIEW  Comparison: 11/23/2010  Findings: There is a comminuted fracture involving the right superior pubic ramus.  There is also a fracture involving the inferior pubic rami on the right.  Previous left hip arthroplasty is identified.  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 Mild degenerative changes involve the right hip.  IMPRESSION:  1.  Acute fractures involve the right superior and inferior pubic rami.   Original Report Authenticated By: Signa Kell, M.D.    Dg Chest Port 1 View  10/26/2012  *RADIOLOGY REPORT*  Clinical Data: Cough and low oxygen saturation  PORTABLE CHEST - 1 VIEW  Comparison: 04/23/2012  Findings: Previous median sternotomy and CABG procedure.  Normal lung volumes.  There is a mild diffuse interstitial edema identified.  No pleural effusion identified.  No airspace consolidation.  IMPRESSION:  1.  Mild interstitial edema.   Original Report Authenticated By: Signa Kell, M.D.     Micro Results    No results found for this or any previous visit (from the past 240 hour(s)).   CBC w Diff:  Lab Results  Component Value Date   WBC 12.0* 10/25/2012   HGB 13.6 10/25/2012   HCT 40.0 10/25/2012   PLT 166 10/25/2012   LYMPHOPCT 21 11/26/2010   MONOPCT 9 11/26/2010   EOSPCT 1 11/26/2010   BASOPCT 0 11/26/2010    CMP:  Lab Results  Component Value Date   NA 133*  10/25/2012   K 4.6 10/25/2012   CL 98 10/25/2012   CO2 26 09/05/2012   BUN 19 10/25/2012   CREATININE 0.90 10/25/2012   CREATININE 0.96 06/01/2012   PROT 5.7* 11/26/2010   ALBUMIN 2.4* 11/26/2010   BILITOT 1.1 11/26/2010   ALKPHOS 82 11/26/2010   AST 25 11/26/2010   ALT 11 11/26/2010  .   Discharge Instructions  WBAT right lower extremity permitted.  Follow with Primary MD [name], MD in 3 days   Get CBC, CMP, checked 3-4 days by Primary MD and again as instructed by your Primary MD.    Get Medicines reviewed and adjusted.  Please request your Prim.MD to go over all Hospital Tests and Procedure/Radiological results at the follow up, please get all Hospital records sent to your Prim MD by signing hospital release before you go home.  Activity: As tolerated with Full fall precautions use walker/cane & assistance as needed   Diet:  Heart Healthy  For Heart failure patients - Check your Weight same time everyday, if you gain over 2 pounds, or you develop in leg swelling, experience more shortness of breath or chest pain, call your Primary MD immediately. Follow Cardiac Low Salt Diet and 1.8 lit/day fluid restriction.  Disposition SNF  If you experience worsening of your admission symptoms, develop shortness of breath, life threatening emergency, suicidal or homicidal thoughts you must seek medical attention immediately by calling 911 or calling your MD immediately  if symptoms less severe.  You Must read complete instructions/literature along with all the possible adverse reactions/side effects for all the Medicines you take and that have been prescribed to you. Take any new Medicines after you have completely understood and accpet all the possible adverse reactions/side effects.   Do not drive and provide baby sitting services if your were admitted for syncope or siezures until you have seen by Primary MD or a Neurologist and advised to do so again.  Do not drive when taking Pain  medications.    Do not take more than prescribed Pain, Sleep and Anxiety Medications  Special Instructions: If you have smoked or chewed Tobacco  in the last 2 yrs please stop smoking, stop any regular Alcohol  and or any Recreational drug use.  Wear Seat belts while driving.       Follow-up Information   Follow up with Shelda Pal, MD In 4 weeks.   Contact information:   [address], [address], [address] [place] [address] [phone]       Follow up with [name], MD. Schedule an appointment as soon as possible for a visit in 3-4 days.   Contact information:   [address] [place] [address] [phone]         Discharge Medications     Medication List    TAKE these medications       aspirin 325 MG EC tablet  Take 325 mg by mouth at bedtime.     calcium-vitamin D 500-200 MG-UNIT per tablet  Commonly known as:  OSCAL WITH D  Take 1 tablet by mouth daily at 12 noon.     cholecalciferol 1000 UNITS tablet  Commonly known as:  VITAMIN D  Take 1,000 Units by mouth daily at 12 noon.     DSS 100 MG Caps  Take 100 mg by mouth 2 (two) times daily.     docusate sodium 100 MG capsule  Commonly known as:  COLACE  Take 100 mg by mouth 2 (two) times daily as needed (constipation).     flecainide 50 MG tablet  Commonly known as:  TAMBOCOR  Take 50 mg by mouth 2 (two) times daily.     lactose free nutrition Liqd  Take 237 mLs by mouth 2 (two) times daily between meals.     levothyroxine 50 MCG tablet  Commonly known as:  SYNTHROID, LEVOTHROID  Take 50 mcg by mouth every morning.     methocarbamol 500 MG tablet  Commonly known as:  ROBAXIN  Take 500 mg by mouth 3 (three) times daily as needed (muscle spasms).     metoprolol succinate 50 MG 24 hr tablet  Commonly known as:  TOPROL-XL  Take 50 mg by mouth every morning.     oxyCODONE 5 MG immediate release tablet  Commonly known as:  Oxy IR/ROXICODONE  Take 1  tablet (5 mg total) by mouth every 4 (four) hours as needed.     oxyCODONE-acetaminophen 5-325 MG per tablet  Commonly known as:  PERCOCET/ROXICET  Take 1 tablet by mouth every 6 (six) hours as needed (pain).     pantoprazole 40 MG tablet  Commonly known as:  PROTONIX  Take 1 tablet (40 mg total) by mouth daily.     polyethylene glycol packet  Commonly known as:  MIRALAX / GLYCOLAX  Take 17 g by mouth every other day.           Total Time in preparing paper work, data evaluation and todays exam - 35 minutes  Leroy Sea M.D on 10/27/2012 at 9:36 AM  [facility] Office  [phone]

## 2012-10-26 NOTE — Care Management Note (Signed)
    Page 1 of 1   10/26/2012     4:05:51 PM   CARE MANAGEMENT NOTE 10/26/2012  Patient:  Yvonne [name], Yvonne [name]   Account Number:  [id number]  Date Initiated:  10/26/2012  Documentation initiated by:  Colleen Can  Subjective/Objective Assessment:   dx pelvic fracture     Action/Plan:   SNF rehab   Anticipated DC Date:  10/27/2012   Anticipated DC Plan:  SKILLED NURSING FACILITY  In-house referral  Clinical Social Worker      DC Planning Services  CM consult      Choice offered to / List presented to:             Status of service:  Completed, signed off Medicare Important Message given?   (If response is "NO", the following Medicare IM given date fields will be blank) Date Medicare IM given:   Date Additional Medicare IM given:    Discharge Disposition:    Per UR Regulation:    If discussed at Long Length of Stay Meetings, dates discussed:    Comments:

## 2012-10-26 NOTE — Progress Notes (Addendum)
INITIAL NUTRITION ASSESSMENT  DOCUMENTATION CODES Per approved criteria  -Severe malnutrition in the context of chronic illness  Pt meets criteria for SEVERE MALNUTRITION in the context of chronic illness as evidenced by 8% wt loss in less than 3 months accompanied by poor po intake (<75% of estimated needs for >1 month).  INTERVENTION: Provide Boost BID  NUTRITION DIAGNOSIS: Inadequate oral intake related to medical condition and decreased appetite as evidenced by family reports and 8% wt loss in less than 3 months.   Goal: Pt to meet >/= 90% of their estimated nutrition needs  Monitor:  PO intake Wt  Reason for Assessment: MST score of 2  79 y.o. female  Admitting Dx: Pelvic fracture  ASSESSMENT: 79 y.o. female, who usually stays at home with son, admitted due to pelvic fracture. Pt reports having an okay appetite and eating 50-75% of meals. Per family pt has had decreased appetite for the past two and a half months due to pain and surgery. Pt's usual body weight is 125 lbs. Pt was drinking Boost supplements at home every other day to help maintain her weight.   Height: Ht Readings from Last 1 Encounters:  10/25/12 5\' 6"  (1.676 m)    Weight: Wt Readings from Last 1 Encounters:  10/25/12 115 lb (52.164 kg)    Ideal Body Weight: 130 lb  % Ideal Body Weight: 88%  Wt Readings from Last 10 Encounters:  10/25/12 115 lb (52.164 kg)  09/05/12 121 lb 7.6 oz (55.1 kg)  09/05/12 121 lb 7.6 oz (55.1 kg)  06/07/12 124 lb (56.246 kg)  06/02/11 124 lb (56.246 kg)  11/10/10 119 lb (53.978 kg)  09/03/10 118 lb 8 oz (53.751 kg)    Usual Body Weight: 125 lb  % Usual Body Weight: 92%  BMI:  Body mass index is 18.57 kg/(m^2).  Estimated Nutritional Needs: Kcal: 1280-1490 Protein: 52-62 grams Fluid: >1.5 L/day  Skin: WDL  Diet Order: General  EDUCATION NEEDS: -No education needs identified at this time   Intake/Output Summary (Last 24 hours) at 10/26/12 1405 Last  data filed at 10/26/12 1308  Gross per 24 hour  Intake    240 ml  Output    250 ml  Net    -10 ml    Last BM: 2/27  Labs:   Recent Labs Lab 10/25/12 1703  NA 133*  K 4.6  CL 98  BUN 19  CREATININE 0.90  GLUCOSE 124*    CBG (last 3)  No results found for this basename: GLUCAP,  in the last 72 hours  Scheduled Meds: . aspirin EC  325 mg Oral QHS  . calcium-vitamin D  1 tablet Oral Q1200  . cholecalciferol  1,000 Units Oral Q1200  . docusate sodium  100 mg Oral BID  . enoxaparin (LOVENOX) injection  30 mg Subcutaneous Q24H  . flecainide  50 mg Oral BID  . levothyroxine  50 mcg Oral QAC breakfast  . metoprolol succinate  50 mg Oral q morning - 10a  . polyethylene glycol  17 g Oral QODAY    Continuous Infusions:   Past Medical History  Diagnosis Date  . Thoracic aortic aneurysm     Ruptured ascending  . History of atrial paroxysmal tachycardia   . History of hypothyroidism   . Compression fracture of L2 lumbar vertebra   . Acute blood loss anemia   . Volume overload   . Hypertension   . Mitral valve prolapse   . Hypothyroidism   .  Afib     -no   . History of blood transfusion   . Shortness of breath     with  exertion  . UTI (lower urinary tract infection)   . GERD (gastroesophageal reflux disease)   . Arthritis     Past Surgical History  Procedure Laterality Date  . Revision of left total hip replacement using some allograft  11/23/2010  . Classic atrioventricular nodal reentrant  10/20/2010  . L2 kyphoplasty using kyphon system.  03/17/2009  . Replacement of ascending aorta and hemiarch and resuspension of the aortic valve with circulatory cardiopulmonary bypass and circulatory arrest  06/25/2010    Dr Tyrone Sage  . Ablation    . Tubal ligation  1966  . Tonsillectomy    . Appendectomy    . Hernia repair  1993    Right Inguinal  . Eye surgery  2003    Bil  . Toe osteotomy      Right 2nd   . Percutaneous pinning wrist fracture  1995    bil  .  Breast surgery      right biospy  . Kyphoplasty  09/05/2012    Procedure: KYPHOPLASTY;  Surgeon: Carmela Hurt, MD;  Location: [facility];  Service: Neurosurgery;  Laterality: N/A;  L1 kyphoplasty    Ian Malkin RD, LDN Inpatient Clinical Dietitian Pager: [phone] After Hours Pager: [phone]

## 2012-10-26 NOTE — Discharge Instructions (Signed)
WBAT right lower extremity permitted.  Follow with Primary MD [name], MD in 7 days   Get CBC, CMP, checked 7 days by Primary MD and again as instructed by your Primary MD. Get a 2 view Chest X ray done next visit if you had Pneumonia of Lung problems at the Hospital.  Get Medicines reviewed and adjusted.  Please request your Prim.MD to go over all Hospital Tests and Procedure/Radiological results at the follow up, please get all Hospital records sent to your Prim MD by signing hospital release before you go home.  Activity: As tolerated with Full fall precautions use walker/cane & assistance as needed   Diet:  Heart Healthy  For Heart failure patients - Check your Weight same time everyday, if you gain over 2 pounds, or you develop in leg swelling, experience more shortness of breath or chest pain, call your Primary MD immediately. Follow Cardiac Low Salt Diet and 1.8 lit/day fluid restriction.  Disposition SNF  If you experience worsening of your admission symptoms, develop shortness of breath, life threatening emergency, suicidal or homicidal thoughts you must seek medical attention immediately by calling 911 or calling your MD immediately  if symptoms less severe.  You Must read complete instructions/literature along with all the possible adverse reactions/side effects for all the Medicines you take and that have been prescribed to you. Take any new Medicines after you have completely understood and accpet all the possible adverse reactions/side effects.   Do not drive and provide baby sitting services if your were admitted for syncope or siezures until you have seen by Primary MD or a Neurologist and advised to do so again.  Do not drive when taking Pain medications.    Do not take more than prescribed Pain, Sleep and Anxiety Medications  Special Instructions: If you have smoked or chewed Tobacco  in the last 2 yrs please stop smoking, stop any regular Alcohol  and or any  Recreational drug use.  Wear Seat belts while driving.

## 2012-10-26 NOTE — Consult Note (Addendum)
  Full consult to follow  PT order modified WBAT RLE permitted   Follow up with [facility] in 3-4 weeks, family can make appointment  PT to assess needs for ST SNF versus returning home with daughter

## 2012-10-26 NOTE — Progress Notes (Signed)
CSW assisting with d/c planning. Pt / family have chosen [facility] for ST SNF placement. D/C Summary has been faxed to SNF for possible week end admission. Daughter to complete admission paper work at [facility] this afternoon. Week end CSW will assist with d/c planning as needed.   Cori Razor LCSW [phone]

## 2012-10-27 DIAGNOSIS — Z8679 Personal history of other diseases of the circulatory system: Secondary | ICD-10-CM

## 2012-10-27 DIAGNOSIS — I4891 Unspecified atrial fibrillation: Secondary | ICD-10-CM

## 2012-10-27 DIAGNOSIS — E039 Hypothyroidism, unspecified: Secondary | ICD-10-CM

## 2012-10-27 MED ORDER — BOOST PLUS PO LIQD: 237.0000 mL | Freq: Two times a day (BID) | ORAL | Status: DC

## 2012-10-27 MED ORDER — PANTOPRAZOLE SODIUM 40 MG PO TBEC: 40.0000 mg | DELAYED_RELEASE_TABLET | Freq: Every day | ORAL | Status: DC

## 2012-10-27 NOTE — Progress Notes (Signed)
Physical Therapy Treatment Patient Details Name: Yvonne [name] MRN: [id number] DOB: 1926/05/08 Today's Date: 10/27/2012 Time: 1330-1405 PT Time Calculation (min): 35 min  PT Assessment / Plan / Recommendation Comments on Treatment Session  Pt tolerated a little better than yesterday.  Pt transfering to [facility] this afternoon for further rehab.     Follow Up Recommendations        Does the patient have the potential to tolerate intense rehabilitation     Barriers to Discharge        Equipment Recommendations       Recommendations for Other Services    Frequency     Plan      Precautions / Restrictions Precautions Precautions: Fall Restrictions Weight Bearing Restrictions: No (pt unable to bear much weight on LE at this time) Other Position/Activity Restrictions: WBAT   Pertinent Vitals/Pain 0/10 in quite lying.  Then up to 6/10 sitting sitting and standing.     Mobility  Bed Mobility Bed Mobility: Supine to Sit;Sit to Supine Supine to Sit: 1: +2 Total assist Supine to Sit: Patient Percentage: 50% Sit to Supine: 1: +2 Total assist Sit to Supine: Patient Percentage: 50% Details for Bed Mobility Assistance: cues for sequence and use of L LE to self assist. Aslo sat EOB for activity tolerance for 10 minutes with min assist at times to helpw ith upright sitting.  needing to lean at times for support.  Transfers Transfers: Sit to Stand;Stand to Sit Sit to Stand: 1: +2 Total assist;From bed;With upper extremity assist Sit to Stand: Patient Percentage: 70% Stand to Sit: 1: +2 Total assist;To bed;With upper extremity assist Stand to Sit: Patient Percentage: 70% Details for Transfer Assistance: stood static standing for 1 minutes, then 15 seconds for tolerance.     Exercises     PT Diagnosis:    PT Problem List:   PT Treatment Interventions:     PT Goals    Visit Information  Last PT Received On: 10/27/12 Assistance Needed: +2 Reason Eval/Treat Not Completed:  Pain limiting ability to participate    Subjective Data  Subjective: I am as ready as I will ever be.    Cognition  Cognition Overall Cognitive Status: Appears within functional limits for tasks assessed/performed Arousal/Alertness: Awake/alert Orientation Level: Appears intact for tasks assessed Behavior During Session: [facility] for tasks performed    Balance     End of Session PT - End of Session Equipment Utilized During Treatment: Gait belt Activity Tolerance: Patient limited by pain;Patient limited by fatigue Patient left: in bed;with call bell/phone within reach;with family/visitor present Nurse Communication: Mobility status   GP     Marella Bile 10/27/2012, 3:55 PM

## 2012-10-27 NOTE — Progress Notes (Signed)
[facility]                                                                                Patient Demographics  Yvonne [name], is a 79 y.o. female, DOB - 1926-08-04, ZOX:[id number], [id number]  Admit date - 10/25/2012  Admitting Physician Carron Curie, MD  Outpatient Primary MD for the patient is [name], MD  LOS - 2   Chief Complaint  Patient presents with  . Fall  . Hip Pain        Assessment & Plan    1. Trip and fall causing pelvic fracture, patient was seen by orthopedic physician Dr.:, Was admitted for conservative medical treatment with pain control and PT, still having considerable pain on weightbearing and will require placement to a rehabilitation. Outpatient monitoring and followup by Dr. Charlann Boxer thereafter .   2.Mild reactionary leukocytosis with stable chest x-ray this admission, UA is stable, afebrile and clinically fine, repeat CBC in a week.   3. For her history of , sick sinus syndrome, hypertension, AV insufficiency, mild pulmonary hypertension and mitral valve prolapse outpatient followup with her cardiologist post discharge.    4.Patient had recent L1 kyphoplasty by Dr. Mikal Plane for which she should follow up with him in 2-3 weeks post discharge.    Code Status: Full  Family Communication: patient  Disposition Plan: SNF   Procedures     Consults  Ortho   DVT Prophylaxis  Lovenox    Lab Results  Component Value Date   PLT 166 10/25/2012    Medications  Scheduled Meds: . aspirin EC  325 mg Oral QHS  . calcium-vitamin D  1 tablet Oral Q1200  . cholecalciferol  1,000 Units Oral Q1200  . docusate sodium  100 mg Oral BID  . enoxaparin (LOVENOX) injection  30 mg Subcutaneous Q24H  . flecainide  50 mg Oral BID  . lactose free nutrition  237 mL Oral BID BM  . levothyroxine  50 mcg Oral QAC breakfast  . metoprolol succinate  50 mg Oral q morning - 10a  . pantoprazole  40 mg Oral Daily  . polyethylene glycol  17 g Oral  QODAY   Continuous Infusions:  PRN Meds:.acetaminophen, acetaminophen, alum & mag hydroxide-simeth, docusate sodium, methocarbamol, morphine injection, morphine injection, ondansetron (ZOFRAN) IV, ondansetron, oxyCODONE, oxyCODONE-acetaminophen, zolpidem  Antibiotics     Anti-infectives   None       Time Spent in minutes   35   SINGH,PRASHANT K M.D on 10/27/2012 at 7:45 AM  Between 7am to 7pm - Pager - [phone]  After 7pm go to [email or web address] - password [email or web address]  And look for the night coverage person covering for me after hours  [facility] Office  [phone]    Subjective:   [name] today has, No headache, No chest pain, No abdominal pain - No Nausea, No new weakness tingling or numbness, No Cough - SOB.    Objective:   Filed Vitals:   10/26/12 0538 10/26/12 1530 10/26/12 2105 10/27/12 0633  BP: 161/61 112/53 166/66 109/56  Pulse: 57 68 80 64  Temp: 98.2 F (36.8 C) 99 F (37.2 C) 98.5 F (36.9 C) 98.7 F (37.1  C)  TempSrc: Oral Oral Oral Oral  Resp: 16 16 16 14   Height:      Weight:      SpO2: 96% 91% 93% 93%    Wt Readings from Last 3 Encounters:  10/25/12 52.164 kg (115 lb)  09/05/12 55.1 kg (121 lb 7.6 oz)  09/05/12 55.1 kg (121 lb 7.6 oz)     Intake/Output Summary (Last 24 hours) at 10/27/12 0745 Last data filed at 10/27/12 0733  Gross per 24 hour  Intake    720 ml  Output    800 ml  Net    -80 ml    Exam Awake Alert, Oriented X 3, No new F.N deficits, Normal affect [place].AT,PERRAL Supple Neck,No JVD, No cervical lymphadenopathy appriciated.  Symmetrical Chest wall movement, Good air movement bilaterally, CTAB RRR,No Gallops,Rubs or new Murmurs, No Parasternal Heave +ve B.Sounds, Abd Soft, Non tender, No organomegaly appriciated, No rebound - guarding or rigidity. No Cyanosis, Clubbing or edema, No new Rash or bruise    Data Review   Micro Results No results found for this or any previous visit (from the past 240  hour(s)).  Radiology Reports Dg Hip Complete Right  10/25/2012  *RADIOLOGY REPORT*  Clinical Data: Fall.  Hip pain.  RIGHT HIP - COMPLETE 2+ VIEW  Comparison: 11/23/2010  Findings: There is a comminuted fracture involving the right superior pubic ramus.  There is also a fracture involving the inferior pubic rami on the right.  Previous left hip arthroplasty is identified.  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 Mild degenerative changes involve the right hip.  IMPRESSION:  1.  Acute fractures involve the right superior and inferior pubic rami.   Original Report Authenticated By: Signa Kell, M.D.    Dg Chest Port 1 View  10/26/2012  *RADIOLOGY REPORT*  Clinical Data: Cough and low oxygen saturation  PORTABLE CHEST - 1 VIEW  Comparison: 04/23/2012  Findings: Previous median sternotomy and CABG procedure.  Normal lung volumes.  There is a mild diffuse interstitial edema identified.  No pleural effusion identified.  No airspace consolidation.  IMPRESSION:  1.  Mild interstitial edema.   Original Report Authenticated By: Signa Kell, M.D.     CBC  Recent Labs Lab 10/25/12 1654 10/25/12 1703  WBC 12.0*  --   HGB 12.5 13.6  HCT 37.9 40.0  PLT 166  --   MCV 92.4  --   MCH 30.5  --   MCHC 33.0  --   RDW 15.6*  --     Chemistries   Recent Labs Lab 10/25/12 1703  NA 133*  K 4.6  CL 98  GLUCOSE 124*  BUN 19  CREATININE 0.90   ------------------------------------------------------------------------------------------------------------------ estimated creatinine clearance is 38.3 ml/min (by C-G formula based on Cr of 0.9). ------------------------------------------------------------------------------------------------------------------ No results found for this basename: HGBA1C,  in the last 72 hours ------------------------------------------------------------------------------------------------------------------ No results found for this basename: CHOL, HDL, LDLCALC,  TRIG, CHOLHDL, LDLDIRECT,  in the last 72 hours ------------------------------------------------------------------------------------------------------------------ No results found for this basename: TSH, T4TOTAL, FREET3, T3FREE, THYROIDAB,  in the last 72 hours ------------------------------------------------------------------------------------------------------------------ No results found for this basename: VITAMINB12, FOLATE, FERRITIN, TIBC, IRON, RETICCTPCT,  in the last 72 hours  Coagulation profile No results found for this basename: INR, PROTIME,  in the last 168 hours  No results found for this basename: DDIMER,  in the last 72 hours  Cardiac Enzymes No results found for this basename: CK, CKMB, TROPONINI, MYOGLOBIN,  in the last 168 hours ------------------------------------------------------------------------------------------------------------------  No components found with this basename: POCBNP,

## 2012-10-27 NOTE — Consult Note (Signed)
Reason for Consult: pelvis fracture Referring Physician:  [facility]  Yvonne [name] is an 79 y.o. female.  HPI: 79 yo female known to my practice had an unfortunate fall after rising from a chair.  Her foot got caught up on something on the floor.  She did not black out remembers whole event.  No dizzinnes, no chest pain.  At time of my interview with her she did not report any other injuries or pain other than right hip pain.  Reports having fallen backward on to her right buttock region  Past Medical History  Diagnosis Date  . Thoracic aortic aneurysm     Ruptured ascending  . History of atrial paroxysmal tachycardia   . History of hypothyroidism   . Compression fracture of L2 lumbar vertebra   . Acute blood loss anemia   . Volume overload   . Hypertension   . Mitral valve prolapse   . Hypothyroidism   . Afib     -no   . History of blood transfusion   . Shortness of breath     with  exertion  . UTI (lower urinary tract infection)   . GERD (gastroesophageal reflux disease)   . Arthritis     Past Surgical History  Procedure Laterality Date  . Revision of left total hip replacement using some allograft  11/23/2010  . Classic atrioventricular nodal reentrant  10/20/2010  . L2 kyphoplasty using kyphon system.  03/17/2009  . Replacement of ascending aorta and hemiarch and resuspension of the aortic valve with circulatory cardiopulmonary bypass and circulatory arrest  06/25/2010    Dr Tyrone Sage  . Ablation    . Tubal ligation  1966  . Tonsillectomy    . Appendectomy    . Hernia repair  1993    Right Inguinal  . Eye surgery  2003    Bil  . Toe osteotomy      Right 2nd   . Percutaneous pinning wrist fracture  1995    bil  . Breast surgery      right biospy  . Kyphoplasty  09/05/2012    Procedure: KYPHOPLASTY;  Surgeon: Carmela Hurt, MD;  Location: [facility];  Service: Neurosurgery;  Laterality: N/A;  L1 kyphoplasty    History reviewed. No  pertinent family history.  Social History:  reports that she has never smoked. She has never used smokeless tobacco. She reports that she does not drink alcohol or use illicit drugs.  Allergies: No Known Allergies  Medications:  I have reviewed the patient's current medications. Scheduled: . aspirin EC  325 mg Oral QHS  . calcium-vitamin D  1 tablet Oral Q1200  . cholecalciferol  1,000 Units Oral Q1200  . docusate sodium  100 mg Oral BID  . enoxaparin (LOVENOX) injection  30 mg Subcutaneous Q24H  . flecainide  50 mg Oral BID  . lactose free nutrition  237 mL Oral BID BM  . levothyroxine  50 mcg Oral QAC breakfast  . metoprolol succinate  50 mg Oral q morning - 10a  . pantoprazole  40 mg Oral Daily  . polyethylene glycol  17 g Oral QODAY    No results found for this or any previous visit (from the past 24 hour(s)).   X-ray: Mildly displaced right superior and inferior rami fractures Left total hip (s/p revision) intact without periprosthetic fracture or dislocation  No recent hospitalizations No chest pain No recent fever chills night sweats No productive cough  Only pertinent for  right pelvic injury  Blood pressure 126/55, pulse 71, temperature 98.6 F (37 C), temperature source Oral, resp. rate 16, height 5\' 6"  (1.676 m), weight 52.164 kg (115 lb), SpO2 91.00%.  Exam: Pertinent medical exam reviewed  Awake alert stable lying in bed at exam No upper extremity bruising, deformity  Lower extremity reveal no gross deformity, NVI Bilateral LE Pain with palpation around hip girdle, limited Some pain with motion of right leg   Assessment/Plan: Right pubic rami fractures, involving the superior and inferior rami, stable pelvic ring injury  PT consulted, WBAT RTC in 3-4 weeks for radiographic follow up Call with questions, [phone]  Noelia Lenart D 10/27/2012, 4:43 PM

## 2012-10-27 NOTE — Progress Notes (Signed)
Pt stable, transported via [facility] to [facility].

## 2012-10-27 NOTE — Progress Notes (Signed)
Per MD, Pt ready for d/c.  Notified RN, Pt, family and facility.  Sent d/c summary.  Confirmed receipt of d/c summary.  Facility ready to receive Pt.  Arranged for transportation.  Amanda Byrdie Miyazaki, LCSWA Clinical Social Work [phone]   

## 2012-10-27 NOTE — Progress Notes (Signed)
Report called to [facility] 

## 2012-10-28 LAB — URINE CULTURE

## 2012-10-28 NOTE — ED Provider Notes (Signed)
I saw and evaluated the patient, reviewed the resident's note and I agree with the findings and plan.  89y female with a mechanical fall at home. Right hip pain. Imaging significant superior and inferior pubic rami fractures on the right. Patient lives alone. Cannot ambulate secondary to pain. Will need an assistive ambulatory device. Ultimately will likely need placement in some type of rehabilitation facility or nursing facility. Discussed with medicine for admission.  Raeford Razor, MD 10/28/12 [phone]

## 2012-12-11 ENCOUNTER — Encounter: Payer: Self-pay | Admitting: *Deleted

## 2012-12-18 ENCOUNTER — Encounter: Payer: Self-pay | Admitting: Cardiovascular Disease

## 2013-01-08 ENCOUNTER — Telehealth: Payer: Self-pay | Admitting: Cardiovascular Disease

## 2013-01-08 MED ORDER — FLECAINIDE ACETATE 50 MG PO TABS: 50.0000 mg | ORAL_TABLET | Freq: Two times a day (BID) | ORAL | Status: DC

## 2013-01-08 MED ORDER — METOPROLOL SUCCINATE ER 50 MG PO TB24: 50.0000 mg | ORAL_TABLET | Freq: Every morning | ORAL | Status: DC

## 2013-01-08 NOTE — Telephone Encounter (Signed)
Needs a prescription refill on: Metoprolol-50mg , Flecainide-50mg ...please fax to [phone]

## 2013-01-08 NOTE — Telephone Encounter (Signed)
Sent 90-day refills to [facility] per pt request.  Pt. Aware.

## 2013-04-03 ENCOUNTER — Other Ambulatory Visit: Payer: Self-pay

## 2013-05-29 ENCOUNTER — Other Ambulatory Visit: Payer: Self-pay

## 2013-05-29 DIAGNOSIS — I712 Thoracic aortic aneurysm, without rupture: Secondary | ICD-10-CM

## 2013-06-24 ENCOUNTER — Other Ambulatory Visit: Payer: Self-pay | Admitting: Cardiothoracic Surgery

## 2013-06-25 LAB — BUN: BUN: 17 mg/dL (ref 6–23)

## 2013-06-25 LAB — CREATININE, SERUM: Creat: 1.02 mg/dL (ref 0.50–1.10)

## 2013-06-27 ENCOUNTER — Encounter: Payer: Self-pay | Admitting: Cardiothoracic Surgery

## 2013-06-27 ENCOUNTER — Ambulatory Visit
Admission: RE | Admit: 2013-06-27 | Discharge: 2013-06-27 | Disposition: A | Discharge status: Home / Self Care | Payer: Medicare Other | Source: Ambulatory Visit | Attending: Cardiothoracic Surgery | Admitting: Cardiothoracic Surgery

## 2013-06-27 ENCOUNTER — Ambulatory Visit (INDEPENDENT_AMBULATORY_CARE_PROVIDER_SITE_OTHER): Payer: Medicare Other | Admitting: Cardiothoracic Surgery

## 2013-06-27 VITALS — BP 162/69 | HR 61 | Resp 19 | Ht 61.0 in | Wt 115.0 lb

## 2013-06-27 DIAGNOSIS — I712 Thoracic aortic aneurysm, without rupture, unspecified: Secondary | ICD-10-CM

## 2013-06-27 MED ORDER — IOHEXOL 350 MG/ML SOLN
60.0000 mL | Freq: Once | INTRAVENOUS | Status: AC | PRN
  Administered 2013-06-27: 60 mL via INTRAVENOUS

## 2013-06-27 NOTE — Progress Notes (Signed)
[address]       [place] [id number]             [phone]          Yvonne [name] [facility] Medical Record #[id number] Date of Birth: June 01, 1926  Referring: Thurmon Fair, MD Primary Care: [name], MD  Chief Complaint:    Chief Complaint  Patient presents with  . Follow-up    1 year f/u with CTA Chest, S/P repair of ruptured ascending aortic aneurysm with dissection on 06/25/10     History of Present Illness:    Patient returns today for followup CT scan of the chest after having repair of a ruptured ascending aortic aneurysm and dissection and resuspension of aortic valve done as an emergency 06/25/2010. Now exactly 3 years postop she continues to be ambulatory and caring for herself. She has had numerous orthopedic injuries related to falls. She notes she's had some rhythm problems and is now on flecainide but refused to take Coumadin.    Current Activity/ Functional Status: Walks with a cane, but otherwise is mobile   Past Medical History  Diagnosis Date  . Thoracic aortic aneurysm 05/2010    Ruptured ascending  . History of atrial paroxysmal tachycardia   . History of hypothyroidism   . Compression fracture of L2 lumbar vertebra   . Acute blood loss anemia   . Volume overload   . Hypertension   . Mitral valve prolapse   . Hypothyroidism   . Afib     -no   . History of blood transfusion   . Shortness of breath     with  exertion  . UTI (lower urinary tract infection)   . GERD (gastroesophageal reflux disease)   . Arthritis   . Pericardial tamponade 05/2010  . Premature atrial contractions     Past Surgical History  Procedure Laterality Date  . Revision of left total hip replacement using some allograft  11/23/2010  . Classic atrioventricular nodal reentrant  10/20/2010  . L2 kyphoplasty using kyphon system.  03/17/2009  . Replacement of ascending aorta and hemiarch and resuspension of the aortic valve with circulatory  cardiopulmonary bypass and circulatory arrest  06/25/2010    Dr Tyrone Sage  . Ablation    . Tubal ligation  1966  . Tonsillectomy    . Appendectomy    . Hernia repair  1993    Right Inguinal  . Eye surgery  2003    Bil  . Toe osteotomy      Right 2nd   . Percutaneous pinning wrist fracture  1995    bil  . Breast surgery      right biospy  . Kyphoplasty  09/05/2012    Procedure: KYPHOPLASTY;  Surgeon: Carmela Hurt, MD;  Location: [facility];  Service: Neurosurgery;  Laterality: N/A;  L1 kyphoplasty    Family History  Problem Relation Age of Onset  . Heart failure Mother   . Stroke Father   . Hypertension Brother   . Hypertension Brother   . Hypertension Sister   . Hypertension Sister   . Stroke Sister     History   Social History  . Marital Status: Widowed    Spouse Name: N/A    Number of Children: N/A  . Years of Education: N/A   Occupational History  . Not on file.   Social History Main Topics  . Smoking status: Never Smoker   . Smokeless tobacco: Never  Used  . Alcohol Use: No  . Drug Use: No  . Sexual Activity: Not on file   Other Topics Concern  . Not on file   Social History Narrative  . No narrative on file    History  Smoking status  . Never Smoker   Smokeless tobacco  . Never Used    History  Alcohol Use No     No Known Allergies  Current Outpatient Prescriptions  Medication Sig Dispense Refill  . calcium-vitamin D (OSCAL WITH D) 500-200 MG-UNIT per tablet Take 1 tablet by mouth daily at 12 noon.      . cholecalciferol (VITAMIN D) 1000 UNITS tablet Take 1,000 Units by mouth daily at 12 noon.      . flecainide (TAMBOCOR) 50 MG tablet Take 1 tablet (50 mg total) by mouth 2 (two) times daily.  180 tablet  3  . levothyroxine (SYNTHROID, LEVOTHROID) 50 MCG tablet Take 50 mcg by mouth every morning.       . metoprolol succinate (TOPROL-XL) 50 MG 24 hr tablet Take 1 tablet (50 mg total) by mouth every morning.  90 tablet  3   No current  facility-administered medications for this visit.       Physical Exam: BP 162/69  Pulse 61  Resp 19  Ht 5\' 1"  (1.549 m)  Wt 115 lb (52.164 kg)  BMI 21.74 kg/m2  SpO2 97%  General appearance: alert, cooperative and cachectic Neurologic: intact Heart: regular rate and rhythm and diastolic murmur: mid diastolic 2/6, decrescendo at 2nd left intercostal space Lungs: clear to auscultation bilaterally and normal percussion bilaterally Abdomen: soft, non-tender; bowel sounds normal; no masses,  no organomegaly Extremities: extremities normal, atraumatic, no cyanosis or edema, Homans sign is negative, no sign of DVT and Patient has pain in the left hip from recent fall on previously replaced Wound: Sternal incision is stable and well healed   Diagnostic Studies & Laboratory data:     Recent Radiology Findings:  Ct Angio Chest Aorta W/cm &/or Wo/cm  06/27/2013   CLINICAL DATA:  Aortic graft repair  EXAM: CT ANGIOGRAPHY CHEST WITH CONTRAST  TECHNIQUE: Multidetector CT imaging of the chest was performed using the standard protocol during bolus administration of intravenous contrast. Multiplanar CT image reconstructions including MIPs were obtained to evaluate the vascular anatomy.  CONTRAST:  60mL OMNIPAQUE IOHEXOL 350 MG/ML SOLN  COMPARISON:  06/07/2012  FINDINGS: The aortic graft extending from just above the aortic valve to the proximal arch is stable in position. Diameter at the inferior anastomosis is 4.9 cm which is maximal diameter in the ascending aorta. Diameter at the distal anastomosis within the aortic arch region is 5.8 cm previously, the maximal diameter in the aortic arch was 5.3 cm. Diameter at the sinus of Valsalva is 4.5 cm and at the sino-tubular junction, it is 4.1 cm.  Chronic thrombus within the native ascending aorta surrounding the graft is again present as was previously visualized. There is no evidence of enhancement within this chronic thrombus to suggest endoleak.  There  is no evidence of dissection or pseudoaneurysm. Innominate artery, right subclavian artery, right common carotid artery, right vertebral artery, left common carotid artery, left vertebral artery, and left subclavian artery are all widely patent.  No obvious filling defects in the pulmonary arterial tree to suggest acute pulmonary thromboembolism.  No abnormal mediastinal adenopathy. Small nodes and postoperative changes are noted.  Fibrotic changes at the lung apices are stable.  The T12 compression fracture is stable. L1  and L2 vertebroplasty has been performed. No new thoracic compression deformity.  No pneumothorax or pleural effusion.  Chronic changes of the kidneys are noted. Marked dilatation of the pancreatic duct is a stable finding dating back to 2012. No obvious pancreatic body or head mass. Incidental gallstones are noted.  Review of the MIP images confirms the above findings.  IMPRESSION: Status post ascending aortic graft repair. At the distal anastomosis, at the aortic arch, maximal diameter has increased from 5.3 cm to 5.8 cm. No evidence of recurrent dissection.  Incidental cholelithiasis.   Electronically Signed   By: Maryclare Bean M.D.   On: 06/27/2013 11:33      Recent Lab Findings: Lab Results  Component Value Date   WBC 12.0* 10/25/2012   HGB 13.6 10/25/2012   HCT 40.0 10/25/2012   PLT 166 10/25/2012   GLUCOSE 124* 10/25/2012   ALT 11 11/26/2010   AST 25 11/26/2010   NA 133* 10/25/2012   K 4.6 10/25/2012   CL 98 10/25/2012   CREATININE 1.02 06/24/2013   BUN 17 06/24/2013   CO2 26 09/05/2012   TSH 13.757* 07/15/2010   INR 0.92 11/17/2010      Assessment / Plan:     Patient returns to the office with a followup CTA of the chest now 3 years after emergency replacement of her acscending aorta resuspension of her aortic valve. She continues to do well postoperatively. CT scan shows dilated arch about 5.3 cm in size. With the patient's age and fragility operative intervention is not  recommended. Aortic arch replacement is discussed with the patient but she is not interested in any intervention. We'll plan to see her back with a followup CTA of the chest in one year.     Delight Ovens MD  Beeper [phone] Office [phone] 06/27/2013 1:01 PM

## 2013-07-04 ENCOUNTER — Other Ambulatory Visit: Payer: Self-pay

## 2013-12-19 ENCOUNTER — Ambulatory Visit (INDEPENDENT_AMBULATORY_CARE_PROVIDER_SITE_OTHER): Payer: Medicare Other | Admitting: Cardiology

## 2013-12-19 ENCOUNTER — Encounter: Payer: Self-pay | Admitting: Cardiology

## 2013-12-19 VITALS — BP 138/60 | HR 59 | Ht 66.0 in | Wt 118.8 lb

## 2013-12-19 DIAGNOSIS — Z87440 Personal history of urinary (tract) infections: Secondary | ICD-10-CM | POA: Insufficient documentation

## 2013-12-19 DIAGNOSIS — I471 Supraventricular tachycardia: Secondary | ICD-10-CM

## 2013-12-19 DIAGNOSIS — R011 Cardiac murmur, unspecified: Secondary | ICD-10-CM

## 2013-12-19 DIAGNOSIS — I440 Atrioventricular block, first degree: Secondary | ICD-10-CM

## 2013-12-19 DIAGNOSIS — I951 Orthostatic hypotension: Secondary | ICD-10-CM | POA: Insufficient documentation

## 2013-12-19 DIAGNOSIS — N390 Urinary tract infection, site not specified: Secondary | ICD-10-CM

## 2013-12-19 DIAGNOSIS — I712 Thoracic aortic aneurysm, without rupture, unspecified: Secondary | ICD-10-CM

## 2013-12-19 DIAGNOSIS — R55 Syncope and collapse: Secondary | ICD-10-CM

## 2013-12-19 DIAGNOSIS — Z8679 Personal history of other diseases of the circulatory system: Secondary | ICD-10-CM

## 2013-12-19 DIAGNOSIS — E46 Unspecified protein-calorie malnutrition: Secondary | ICD-10-CM | POA: Insufficient documentation

## 2013-12-19 MED ORDER — METOPROLOL SUCCINATE ER 25 MG PO TB24: 25.0000 mg | ORAL_TABLET | Freq: Every day | ORAL | Status: DC

## 2013-12-19 NOTE — Assessment & Plan Note (Signed)
Admits to not overly hungry.  Liquid intake poor.  She has gained 2 pounds since last visit with Dr. Sallyanne Kuster.  Will check cmp but have asked her to drink one extra glass of liquid a day.  Milkshake would be good.

## 2013-12-19 NOTE — Assessment & Plan Note (Signed)
Further prolonged was 230 ms now 300 ms.  Decreased toprol to 25 mg daily.

## 2013-12-19 NOTE — Patient Instructions (Signed)
We would like you to decrease metoprolol to 25 mg daily.  Wear monitor for 30 days to look for arrthymias.  Have an ultrasound of your heart to check murmur.  Have lab work done to make sure lab abnormality was not cause of episode.   Follow up with Dr. Sallyanne Kuster in 3-4 weeks   Check BP at home and call if greater than 150 for more than 2 days.  Drink at least one more glass of liquid daily or have a milk shake daily.

## 2013-12-19 NOTE — Assessment & Plan Note (Signed)
BP drops lying 178/60 to standing 138/60 no dizziness here with standing and she was sitting when the syncope occurred.  We decreased BB to 25 mg daily.

## 2013-12-19 NOTE — Assessment & Plan Note (Signed)
Check TSH 

## 2013-12-19 NOTE — Assessment & Plan Note (Signed)
Episode of syncope last pm, felt sick and then woke up on church table, EMS asked her to go to hospital but she did not wish to go.  No injury.  Check labs, cbc, cmp, mg+, tsh, check  U/a as she has hx of UTIs.  Will check echo and event monitor.  She is on flecainide and has done well, but could be pro arrythmia causing syncope.

## 2013-12-19 NOTE — Assessment & Plan Note (Signed)
With ruptured and repair.

## 2013-12-19 NOTE — Progress Notes (Signed)
12/19/2013   PCP: Lujean Amel, MD   Chief Complaint  Patient presents with  . Follow-up    patient pass out yesterday at church, she felt a little sick then couldn't remember what happen after, she was out for a few seconds    Primary Cardiologist: Dr. Bertrum Sol  HPI:  80 year old widowed white female with a history of contained rupture of large aneurysm of the descending aorta that was complicated by pericardial tamponade 3 years ago. She underwent surgery with replacement of ascending aorta in the arch and resuspension of the aortic valve.  She has yearly CAT scans followed by Dr. Servando Snare.  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 Rayann Heman February 2012.  She remains on flecainide to prevent tachycardia.  Dr. Sallyanne Kuster has discussed possibility of pro arrhythmia but it has prevented any tachycardia and she prefers to stay on it.  Last office visit with Dr. Sallyanne Kuster he discussed her weight and asked to gain 10 pounds before the next visit.  It has been almost a year and she is 2 pounds.    She called and asked to be seen today after a syncopal episode last evening.  She felt well and was at church for the Wednesday evening meal serving sitting in a chair and she started feeling sick and the next thing she knew she was leaning on a table she never fell out of the chair for what she believes that a gentleman carried her to table and called EMS. Her blood pressure on their arrival was 176/66 she was in a sinus rhythm she does have first degree block as well as LVH.  She did not wish to go to the emergency room.  She had no memory changes, she had no loss of bowel or bladder control. She had no awareness of any arrhythmia or prior to the episode. The day of syncope she had one cup of coffee a small glass of juice and hot chocolate.  Chief small meals with cereal for breakfast snacks during  the day but minimal and then for dinner she will have a salad or what they serve at church. She does not like to cook for herself.   She denies chest pain or shortness of breath. Her EKG does show an increasing first degree AV block it had been 2109ms but now is 300 ms. QTC is slightly longer nail 465 ms.  No Known Allergies  Current Outpatient Prescriptions  Medication Sig Dispense Refill  . aspirin 325 MG tablet Take 325 mg by mouth daily.      . calcium-vitamin D (OSCAL WITH D) 500-200 MG-UNIT per tablet Take 1 tablet by mouth daily at 12 noon.      . cholecalciferol (VITAMIN D) 1000 UNITS tablet Take 1,000 Units by mouth daily at 12 noon.      . flecainide (TAMBOCOR) 50 MG tablet Take 1 tablet (50 mg total) by mouth 2 (two) times daily.  180 tablet  3  . levothyroxine (SYNTHROID, LEVOTHROID) 50 MCG tablet Take 50 mcg by mouth every morning.       . metoprolol succinate (TOPROL XL) 25 MG 24 hr tablet Take 1 tablet (25 mg total) by mouth daily.  30 tablet  6   No current facility-administered medications for this visit.    Past Medical History  Diagnosis Date  . Thoracic aortic aneurysm 05/2010    Ruptured ascending  .  History of atrial paroxysmal tachycardia   . History of hypothyroidism   . Compression fracture of L2 lumbar vertebra   . Acute blood loss anemia   . Volume overload   . Hypertension   . Mitral valve prolapse   . Hypothyroidism   . Afib     -no   . History of blood transfusion   . Shortness of breath     with  exertion  . UTI (lower urinary tract infection)   . GERD (gastroesophageal reflux disease)   . Arthritis   . Pericardial tamponade 05/2010  . Premature atrial contractions     Past Surgical History  Procedure Laterality Date  . Revision of left total hip replacement using some allograft  11/23/2010  . Classic atrioventricular nodal reentrant  10/20/2010  . L2 kyphoplasty using kyphon system.  03/17/2009  . Replacement of ascending aorta and  hemiarch and resuspension of the aortic valve with circulatory cardiopulmonary bypass and circulatory arrest  06/25/2010    Dr Servando Snare  . Ablation    . Tubal ligation  1966  . Tonsillectomy    . Appendectomy    . Hernia repair  1993    Right Inguinal  . Eye surgery  2003    Bil  . Toe osteotomy      Right 2nd   . Percutaneous pinning wrist fracture  1995    bil  . Breast surgery      right biospy  . Kyphoplasty  09/05/2012    Procedure: KYPHOPLASTY;  Surgeon: Winfield Cunas, MD;  Location: [facility];  Service: Neurosurgery;  Laterality: N/A;  L1 kyphoplasty    ONG:EXBMWUX:LK colds or fevers,  weight up 2 pounds from 12/2012 Skin:no rashes or ulcers HEENT:no blurred vision, no congestion CV:see HPI PUL:see HPI GI:no diarrhea constipation or melena, no indigestion GU:no hematuria, one episode dysuria, she has hx of UTIs in the past. MS:no joint pain, no claudication Neuro:+ syncope, no lightheadedness Endo:no diabetes, + thyroid disease  PHYSICAL EXAM  Original BP 190/70, then lying 178/60, sitting 170/62 and standing 138/60.  BP 138/60  Pulse 59  Ht 5\' 6"  (1.676 m)  Wt 118 lb 12.8 oz (53.887 kg)  BMI 19.18 kg/m2 General:Pleasant affect, NAD Skin:Warm and dry, brisk capillary refill HEENT:normocephalic, sclera clear, mucus membranes moist Neck:supple, no JVD, no bruits  Heart:S1S2 RRR with 2/3 systolic murmur, no gallup, rub or click Lungs:clear without rales, rhonchi, or wheezes GMW:NUUV, non tender, + BS, do not palpate liver spleen or masses Ext:no lower ext edema, 2+ pedal pulses, 2+ radial pulses Neuro:alert and oriented, MAE, follows commands, + facial symmetry  OZD:GUYQIH at 59 with increased 1st degree AV block and LVH   ASSESSMENT AND PLAN Syncope Episode of syncope last pm, felt sick and then woke up on church table, EMS asked her to go to hospital but she did not wish to go.  No injury.  Check labs, cbc, cmp, mg+, tsh, check  U/a as she has hx of UTIs.   Will check echo and event monitor.  She is on flecainide and has done well, but could be pro arrythmia causing syncope.    Orthostatic hypotension BP drops lying 178/60 to standing 138/60 no dizziness here with standing and she was sitting when the syncope occurred.  We decreased BB to 25 mg daily.    AV block, 1st degree Further prolonged was 230 ms now 300 ms.  Decreased toprol to 25 mg daily.  History of atrial paroxysmal tachycardia  Flecainide has done well but my be causing pro arrhthymias, will have her wear 30 day event monitor.  She will follow up with Dr. Sallyanne Kuster as previously arranged. Has undergone AVNRT ablation in 09/2010 with Dr. Rayann Heman.  Malnourished Admits to not overly hungry.  Liquid intake poor.  She has gained 2 pounds since last visit with Dr. Sallyanne Kuster.  Will check cmp but have asked her to drink one extra glass of liquid a day.  Milkshake would be good.    HYPOTHYROIDISM Check TSH  Hx: UTI (urinary tract infection) Will check U/A to rule out infection with dehydration as cause for syncope.  Systolic murmur Aortic outflow murmur, will check Echo.  Dr. Servando Snare has followed CT of chest since aortic aneurysm repair.  Thoracic aortic aneurysm With ruptured and repair.    She will keep appt with Dr. Sallyanne Kuster on 5/11/5.  Pt will call if BP is staying > 546 systolic. If lytes are stable I will add ARB for improved BP control.

## 2013-12-19 NOTE — Assessment & Plan Note (Signed)
Aortic outflow murmur, will check Echo.  Dr. Servando Snare has followed CT of chest since aortic aneurysm repair.

## 2013-12-19 NOTE — Assessment & Plan Note (Signed)
Will check U/A to rule out infection with dehydration as cause for syncope.

## 2013-12-19 NOTE — Assessment & Plan Note (Addendum)
Flecainide has done well but my be causing pro arrhthymias, will have her wear 30 day event monitor.  She will follow up with Dr. Sallyanne Kuster as previously arranged. Has undergone AVNRT ablation in 09/2010 with Dr. Rayann Heman.

## 2013-12-20 LAB — URINALYSIS, ROUTINE W REFLEX MICROSCOPIC
Bilirubin Urine: NEGATIVE
GLUCOSE, UA: NEGATIVE mg/dL
Hgb urine dipstick: NEGATIVE
Ketones, ur: NEGATIVE mg/dL
LEUKOCYTES UA: NEGATIVE
NITRITE: NEGATIVE
PH: 7 (ref 5.0–8.0)
Protein, ur: NEGATIVE mg/dL
SPECIFIC GRAVITY, URINE: 1.011 (ref 1.005–1.030)
Urobilinogen, UA: 0.2 mg/dL (ref 0.0–1.0)

## 2013-12-20 LAB — COMPLETE METABOLIC PANEL WITH GFR
ALBUMIN: 3.8 g/dL (ref 3.5–5.2)
ALK PHOS: 69 U/L (ref 39–117)
ALT: <8 U/L (ref 0–35)
AST: 16 U/L (ref 0–37)
BILIRUBIN TOTAL: 1.2 mg/dL (ref 0.2–1.2)
BUN: 21 mg/dL (ref 6–23)
CO2: 28 mEq/L (ref 19–32)
CREATININE: 1.12 mg/dL — AB (ref 0.50–1.10)
Calcium: 9.1 mg/dL (ref 8.4–10.5)
Chloride: 99 mEq/L (ref 96–112)
GFR, Est African American: 52 mL/min — ABNORMAL LOW
GFR, Est Non African American: 45 mL/min — ABNORMAL LOW
Glucose, Bld: 96 mg/dL (ref 70–99)
POTASSIUM: 4.7 meq/L (ref 3.5–5.3)
Sodium: 138 mEq/L (ref 135–145)
Total Protein: 6.7 g/dL (ref 6.0–8.3)

## 2013-12-20 LAB — CBC
HCT: 38.7 % (ref 36.0–46.0)
HEMOGLOBIN: 13.3 g/dL (ref 12.0–15.0)
MCH: 30.6 pg (ref 26.0–34.0)
MCHC: 34.4 g/dL (ref 30.0–36.0)
MCV: 89.2 fL (ref 78.0–100.0)
PLATELETS: 131 10*3/uL — AB (ref 150–400)
RBC: 4.34 MIL/uL (ref 3.87–5.11)
RDW: 14.5 % (ref 11.5–15.5)
WBC: 5 10*3/uL (ref 4.0–10.5)

## 2013-12-20 LAB — TSH: TSH: 1.861 u[IU]/mL (ref 0.350–4.500)

## 2013-12-20 LAB — MAGNESIUM: MAGNESIUM: 1.9 mg/dL (ref 1.5–2.5)

## 2013-12-25 ENCOUNTER — Ambulatory Visit (HOSPITAL_COMMUNITY)
Admission: RE | Admit: 2013-12-25 | Discharge: 2013-12-25 | Disposition: A | Discharge status: Home / Self Care | Payer: Medicare Other | Source: Ambulatory Visit | Attending: Cardiovascular Disease | Admitting: Cardiovascular Disease

## 2013-12-25 DIAGNOSIS — I359 Nonrheumatic aortic valve disorder, unspecified: Secondary | ICD-10-CM

## 2013-12-25 DIAGNOSIS — R55 Syncope and collapse: Secondary | ICD-10-CM | POA: Insufficient documentation

## 2013-12-25 DIAGNOSIS — R011 Cardiac murmur, unspecified: Secondary | ICD-10-CM

## 2013-12-25 DIAGNOSIS — I471 Supraventricular tachycardia, unspecified: Secondary | ICD-10-CM | POA: Insufficient documentation

## 2013-12-25 NOTE — Progress Notes (Signed)
2D Echocardiogram Complete.  12/25/2013  Yvonne [name] [place], RDCS

## 2013-12-26 ENCOUNTER — Other Ambulatory Visit: Payer: Self-pay | Admitting: *Deleted

## 2013-12-26 ENCOUNTER — Telehealth: Payer: Self-pay | Admitting: Cardiology

## 2013-12-26 DIAGNOSIS — R5383 Other fatigue: Principal | ICD-10-CM

## 2013-12-26 DIAGNOSIS — R5381 Other malaise: Secondary | ICD-10-CM

## 2013-12-26 MED ORDER — LOSARTAN POTASSIUM 25 MG PO TABS: 25.0000 mg | ORAL_TABLET | Freq: Every day | ORAL | Status: DC

## 2013-12-26 NOTE — Telephone Encounter (Signed)
Want lab results from last Thursday please.

## 2013-12-26 NOTE — Telephone Encounter (Signed)
Message forwarded to J.C. Wildman, LPN.  

## 2013-12-31 ENCOUNTER — Other Ambulatory Visit: Payer: Self-pay | Admitting: *Deleted

## 2014-01-01 NOTE — Telephone Encounter (Signed)
Lab results were called to pt.

## 2014-01-03 LAB — BASIC METABOLIC PANEL
BUN: 21 mg/dL (ref 6–23)
CHLORIDE: 102 meq/L (ref 96–112)
CO2: 29 meq/L (ref 19–32)
Calcium: 8.9 mg/dL (ref 8.4–10.5)
Creat: 0.9 mg/dL (ref 0.50–1.10)
Glucose, Bld: 92 mg/dL (ref 70–99)
Potassium: 4.5 mEq/L (ref 3.5–5.3)
SODIUM: 138 meq/L (ref 135–145)

## 2014-01-06 ENCOUNTER — Encounter: Payer: Self-pay | Admitting: Cardiovascular Disease

## 2014-01-06 ENCOUNTER — Ambulatory Visit: Payer: Medicare Other | Admitting: Cardiovascular Disease

## 2014-01-06 ENCOUNTER — Ambulatory Visit (INDEPENDENT_AMBULATORY_CARE_PROVIDER_SITE_OTHER): Payer: Medicare Other | Admitting: Cardiovascular Disease

## 2014-01-06 VITALS — BP 135/60 | HR 61 | Resp 20 | Ht 66.0 in | Wt 119.8 lb

## 2014-01-06 DIAGNOSIS — I712 Thoracic aortic aneurysm, without rupture, unspecified: Secondary | ICD-10-CM

## 2014-01-06 DIAGNOSIS — R55 Syncope and collapse: Secondary | ICD-10-CM

## 2014-01-06 DIAGNOSIS — I471 Supraventricular tachycardia: Secondary | ICD-10-CM

## 2014-01-06 MED ORDER — FLECAINIDE ACETATE 50 MG PO TABS: 50.0000 mg | ORAL_TABLET | Freq: Two times a day (BID) | ORAL | Status: DC

## 2014-01-06 NOTE — Patient Instructions (Signed)
Your physician has recommended you make the following change in your medication: decrease your TOPROL TO 1/2 TABLET DAILY (12.5mg )  Your physician recommends that you schedule a follow-up appointment in: 1 month with Mickel Baas, NP or Dr. Sallyanne Kuster

## 2014-01-06 NOTE — Progress Notes (Signed)
Patient ID: Yvonne [name], female   DOB: 06-22-1926, 80 y.o.   MRN: [id number]      Reason for office visit Atrial tachycardia, syncope, ascending aortic aneurysm rupture/repair, mitral valve prolapse  [name] returns in followup after an episode of syncope that occurred at church. It was very brief and was associated with a prolonged prodrome of weakness, but not nausea, diaphoresis or other typical vagal symptoms. Her electrocardiogram showed significant prolongation of her PR interval. Her dose of beta blocker was reduced in half and she is now wearing an event monitor. Even after the reduction in beta blocker dosage, she has had episodes of marked bradycardia, most of them appear to be blocked PACs, but some may be caused by second degree atrial ventricular block Mobitz type I. The lowest heart rate recorded was 23 beats per minute (albeit very briefly and doing expected sleeping hours). She has not activated the device for any symptomatic events.  She hasn't had any new problems related to her aortic aneurysm. This presented with pericardial tamponade secondary to rupture roughly 3 years ago and was treated with replacement of the ascending aorta with resuspension of the native valve by Dr. Servando Snare.  Has a lifelong history of atrial tachycardia and had a partially successful radiofrequency ablation in 2012 by Dr. Rayann Heman. She still requires flecainide for symptom relief.   No Known Allergies  Current Outpatient Prescriptions  Medication Sig Dispense Refill  . aspirin 325 MG tablet Take 325 mg by mouth daily.      . calcium-vitamin D (OSCAL WITH D) 500-200 MG-UNIT per tablet Take 1 tablet by mouth daily at 12 noon.      . cholecalciferol (VITAMIN D) 1000 UNITS tablet Take 1,000 Units by mouth daily at 12 noon.      . flecainide (TAMBOCOR) 50 MG tablet Take 1 tablet (50 mg total) by mouth 2 (two) times daily.  180 tablet  3  . levothyroxine (SYNTHROID, LEVOTHROID) 50 MCG tablet  Take 50 mcg by mouth every morning.       Marland Kitchen losartan (COZAAR) 25 MG tablet Take 1 tablet (25 mg total) by mouth daily.  90 tablet  3  . metoprolol succinate (TOPROL-XL) 25 MG 24 hr tablet Take 12.5 mg by mouth daily.       No current facility-administered medications for this visit.    Past Medical History  Diagnosis Date  . Thoracic aortic aneurysm 05/2010    Ruptured ascending  . History of atrial paroxysmal tachycardia   . History of hypothyroidism   . Compression fracture of L2 lumbar vertebra   . Acute blood loss anemia   . Volume overload   . Hypertension   . Mitral valve prolapse   . Hypothyroidism   . Afib     -no   . History of blood transfusion   . Shortness of breath     with  exertion  . UTI (lower urinary tract infection)   . GERD (gastroesophageal reflux disease)   . Arthritis   . Pericardial tamponade 05/2010  . Premature atrial contractions     Past Surgical History  Procedure Laterality Date  . Revision of left total hip replacement using some allograft  11/23/2010  . Classic atrioventricular nodal reentrant  10/20/2010  . L2 kyphoplasty using kyphon system.  03/17/2009  . Replacement of ascending aorta and hemiarch and resuspension of the aortic valve with circulatory cardiopulmonary bypass and circulatory arrest  06/25/2010    Dr Servando Snare  . Ablation    .  Tubal ligation  1966  . Tonsillectomy    . Appendectomy    . Hernia repair  1993    Right Inguinal  . Eye surgery  2003    Bil  . Toe osteotomy      Right 2nd   . Percutaneous pinning wrist fracture  1995    bil  . Breast surgery      right biospy  . Kyphoplasty  09/05/2012    Procedure: KYPHOPLASTY;  Surgeon: Winfield Cunas, MD;  Location: [facility];  Service: Neurosurgery;  Laterality: N/A;  L1 kyphoplasty    Family History  Problem Relation Age of Onset  . Heart failure Mother   . Stroke Father   . Hypertension Brother   . Hypertension Brother   . Hypertension Sister   .  Hypertension Sister   . Stroke Sister     History   Social History  . Marital Status: Widowed    Spouse Name: N/A    Number of Children: N/A  . Years of Education: N/A   Occupational History  . Not on file.   Social History Main Topics  . Smoking status: Never Smoker   . Smokeless tobacco: Never Used  . Alcohol Use: No  . Drug Use: No  . Sexual Activity: Not on file   Other Topics Concern  . Not on file   Social History Narrative  . No narrative on file    Review of systems: Occasionally unsteady on her feet and weak, but no new syncopal events. The patient specifically denies any chest pain at rest or with exertion, dyspnea at rest or with exertion, orthopnea, paroxysmal nocturnal dyspnea, palpitations, focal neurological deficits, intermittent claudication, lower extremity edema, unexplained weight gain, cough, hemoptysis or wheezing.  The patient also denies abdominal pain, nausea, vomiting, dysphagia, diarrhea, constipation, polyuria, polydipsia, dysuria, hematuria, frequency, urgency, abnormal bleeding or bruising, fever, chills, unexpected weight changes, mood swings, change in skin or hair texture, change in voice quality, auditory or visual problems, allergic reactions or rashes, new musculoskeletal complaints other than usual "aches and pains".   PHYSICAL EXAM BP 135/60  Pulse 61  Resp 20  Ht 5\' 6"  (1.676 m)  Wt 119 lb 12.8 oz (54.341 kg)  BMI 19.35 kg/m2  General: Alert, oriented x3, no distress, very lean Head: no evidence of trauma, PERRL, EOMI, no exophtalmos or lid lag, no myxedema, no xanthelasma; normal ears, nose and oropharynx Neck: normal jugular venous pulsations and no hepatojugular reflux; brisk carotid pulses without delay and no carotid bruits Chest: clear to auscultation, no signs of consolidation by percussion or palpation, normal fremitus, symmetrical and full respiratory excursions Cardiovascular: normal position and quality of the apical  impulse, regular rhythm, normal first and second heart sounds, no murmurs, rubs or gallops Abdomen: no tenderness or distention, no masses by palpation, no abnormal pulsatility or arterial bruits, normal bowel sounds, no hepatosplenomegaly Extremities: no clubbing, cyanosis or edema; 2+ radial, ulnar and brachial pulses bilaterally; 2+ right femoral, posterior tibial and dorsalis pedis pulses; 2+ left femoral, posterior tibial and dorsalis pedis pulses; no subclavian or femoral bruits Neurological: grossly nonfocal   EKG: Normal sinus rhythm, first degree AV block 260 ms, QS in leads V1 and V2 (old), chronic ST segment depression with biphasic T waves in leads V5 V6 and inferior leads, unchanged from previous tracings  Lipid Panel  No results found for this basename: chol, trig, hdl, cholhdl, vldl, ldlcalc    BMET    Component Value  Date/Time   NA 138 01/02/2014 1302   K 4.5 01/02/2014 1302   CL 102 01/02/2014 1302   CO2 29 01/02/2014 1302   GLUCOSE 92 01/02/2014 1302   BUN 21 01/02/2014 1302   CREATININE 0.90 01/02/2014 1302   CREATININE 0.90 10/25/2012 1703   CALCIUM 8.9 01/02/2014 1302   GFRNONAA 45* 12/19/2013 0920   GFRNONAA 49* 09/05/2012 1323   GFRAA 52* 12/19/2013 0920   GFRAA 57* 09/05/2012 1323     ASSESSMENT AND PLAN Syncope The bulk of the evidence available seems to point to bradycardia arrhythmia as a Cobos for syncope. Will decrease the beta blocker further to 12.5 mg once daily. It is highly desirable that she remain on beta blocker therapy secondary to her history of aortic aneurysm, as well as ongoing thalidomide therapy for atrial tachycardia. We may have to consider pacemaker implantation. She is instructed to continue wearing the event monitor until the 30 days are completed. If she continues to have marked bradycardia even on this tiny dose of metoprolol, I would probably recommend a pacemaker.  Thoracic aortic aneurysm No problems at her last visit with Dr. Servando Snare and chest CT  in October 2014   Patient Instructions  Your physician has recommended you make the following change in your medication: decrease your TOPROL TO 1/2 TABLET DAILY (12.5mg )  Your physician recommends that you schedule a follow-up appointment in: 1 month with Mickel Baas, NP or Dr. Sallyanne Kuster      No orders of the defined types were placed in this encounter.   Meds ordered this encounter  Medications  . metoprolol succinate (TOPROL-XL) 25 MG 24 hr tablet    Sig: Take 12.5 mg by mouth daily.    Jamariah Tony  Sanda Klein, MD, [facility] [phone] office [phone] pager

## 2014-01-06 NOTE — Assessment & Plan Note (Signed)
The bulk of the evidence available seems to point to bradycardia arrhythmia as a Cobos for syncope. Will decrease the beta blocker further to 12.5 mg once daily. It is highly desirable that she remain on beta blocker therapy secondary to her history of aortic aneurysm, as well as ongoing thalidomide therapy for atrial tachycardia. We may have to consider pacemaker implantation. She is instructed to continue wearing the event monitor until the 30 days are completed. If she continues to have marked bradycardia even on this tiny dose of metoprolol, I would probably recommend a pacemaker.

## 2014-01-06 NOTE — Assessment & Plan Note (Signed)
No problems at her last visit with Dr. Servando Snare and chest CT in October 2014

## 2014-01-07 NOTE — Progress Notes (Signed)
Pt. Informed of Lab results

## 2014-01-15 ENCOUNTER — Telehealth: Payer: Self-pay | Admitting: Cardiovascular Disease

## 2014-01-16 NOTE — Telephone Encounter (Signed)
Closed encounter °

## 2014-01-28 NOTE — Progress Notes (Signed)
No just keep that appt, didn't realize it was made

## 2014-02-10 ENCOUNTER — Encounter: Payer: Self-pay | Admitting: Cardiology

## 2014-02-10 ENCOUNTER — Ambulatory Visit (INDEPENDENT_AMBULATORY_CARE_PROVIDER_SITE_OTHER): Payer: Medicare Other | Admitting: Cardiology

## 2014-02-10 VITALS — BP 112/52 | HR 82 | Ht 66.0 in | Wt 118.0 lb

## 2014-02-10 DIAGNOSIS — I712 Thoracic aortic aneurysm, without rupture, unspecified: Secondary | ICD-10-CM

## 2014-02-10 DIAGNOSIS — I498 Other specified cardiac arrhythmias: Secondary | ICD-10-CM

## 2014-02-10 DIAGNOSIS — R55 Syncope and collapse: Secondary | ICD-10-CM

## 2014-02-10 DIAGNOSIS — I440 Atrioventricular block, first degree: Secondary | ICD-10-CM

## 2014-02-10 DIAGNOSIS — I10 Essential (primary) hypertension: Secondary | ICD-10-CM

## 2014-02-10 MED ORDER — FLECAINIDE ACETATE 50 MG PO TABS: 50.0000 mg | ORAL_TABLET | Freq: Two times a day (BID) | ORAL | Status: DC

## 2014-02-10 NOTE — Patient Instructions (Addendum)
Continue flecainide 1/2 of 50 mg tab twice a day.  We will check carotid dopplers- we did not do previously  Your EKG is normal rhythm rate of 83  Your physician recommends that you schedule a follow-up appointment in:  6 or 7 months with Dr. Sallyanne Kuster

## 2014-02-11 NOTE — Progress Notes (Signed)
02/13/2014   PCP: Lujean Amel, MD   Chief Complaint  Patient presents with  . Follow-up    1 month visit    Primary Cardiologist:Dr. Jerilynn Mages Croitoru   HPI:  Yvonne [name] returns in followup after an episode of syncope that occurred at church. It was very brief and was associated with a prolonged prodrome of weakness, but not nausea, diaphoresis or other typical vagal symptoms. Her electrocardiogram showed significant prolongation of her PR interval. Her dose of beta blocker was reduced in half and she is now wearing an event monitor. Even after the reduction in beta blocker dosage, she has had episodes of marked bradycardia, most of them appear to be blocked PACs, but some may be caused by second degree atrial ventricular block Mobitz type I. The lowest heart rate recorded was 23 beats per minute (albeit very briefly and doing expected sleeping hours). She has not activated the device for any symptomatic events.  Has a lifelong history of atrial tachycardia and had a partially successful radiofrequency ablation in 2012 by Dr. Rayann Heman. She still requires flecainide for symptom relief.    She is here for followup after her for event monitor continued with 2 to one heart block times and her beta blocker was completely stopped by Dr. Jerilynn Mages. Croitoru.  She has no complaints no chest pain no shortness of breath but she felt well before her syncopal episode as well. She also brings an insurance paper with her concerned about carotid stenosis as cause of syncope. I do not see any carotid Dopplers in her chart therefore we will proceed with carotid Dopplers when they can be arranged. Though we do believe the most likely cause of syncope was bradycardia.   If she develops tachycardia again on only a low dose of flecainide she may require pacemaker.     No Known Allergies  Current Outpatient Prescriptions  Medication Sig Dispense Refill  . aspirin 325 MG tablet Take 325 mg by mouth  daily.      . calcium-vitamin D (OSCAL WITH D) 500-200 MG-UNIT per tablet Take 1 tablet by mouth daily at 12 noon.      . cholecalciferol (VITAMIN D) 1000 UNITS tablet Take 1,000 Units by mouth daily at 12 noon.      . flecainide (TAMBOCOR) 50 MG tablet Take 1 tablet (50 mg total) by mouth 2 (two) times daily. Take 1/2 tab two times a day to equal 50 mg daily  30 tablet  6  . levothyroxine (SYNTHROID, LEVOTHROID) 50 MCG tablet Take 50 mcg by mouth every morning.       Marland Kitchen losartan (COZAAR) 25 MG tablet Take 1 tablet (25 mg total) by mouth daily.  90 tablet  3   No current facility-administered medications for this visit.    Past Medical History  Diagnosis Date  . Thoracic aortic aneurysm 05/2010    Ruptured ascending  . History of atrial paroxysmal tachycardia   . History of hypothyroidism   . Compression fracture of L2 lumbar vertebra   . Acute blood loss anemia   . Volume overload   . Hypertension   . Mitral valve prolapse   . Hypothyroidism   . Afib     -no   . History of blood transfusion   . Shortness of breath     with  exertion  . UTI (lower urinary tract infection)   . GERD (gastroesophageal reflux disease)   . Arthritis   .  Pericardial tamponade 05/2010  . Premature atrial contractions     Past Surgical History  Procedure Laterality Date  . Revision of left total hip replacement using some allograft  11/23/2010  . Classic atrioventricular nodal reentrant  10/20/2010  . L2 kyphoplasty using kyphon system.  03/17/2009  . Replacement of ascending aorta and hemiarch and resuspension of the aortic valve with circulatory cardiopulmonary bypass and circulatory arrest  06/25/2010    Dr Servando Snare  . Ablation    . Tubal ligation  1966  . Tonsillectomy    . Appendectomy    . Hernia repair  1993    Right Inguinal  . Eye surgery  2003    Bil  . Toe osteotomy      Right 2nd   . Percutaneous pinning wrist fracture  1995    bil  . Breast surgery      right biospy  .  Kyphoplasty  09/05/2012    Procedure: KYPHOPLASTY;  Surgeon: Winfield Cunas, MD;  Location: [facility];  Service: Neurosurgery;  Laterality: N/A;  L1 kyphoplasty    IWL:NLGXQJJ:HE colds or fevers, no weight changes Skin:no rashes or ulcers HEENT:no blurred vision, no congestion CV:see HPI PUL:see HPI GI:no diarrhea constipation or melena, no indigestion GU:no hematuria, no dysuria MS:no joint pain, no claudication Neuro:no further syncope, no lightheadedness Endo:no diabetes, no thyroid disease  Wt Readings from Last 3 Encounters:  02/10/14 118 lb (53.524 kg)  01/06/14 119 lb 12.8 oz (54.341 kg)  12/19/13 118 lb 12.8 oz (53.887 kg)    PHYSICAL EXAM BP 112/52  Pulse 82  Ht 5\' 6"  (1.676 m)  Wt 118 lb (53.524 kg)  BMI 19.05 kg/m2 General:Pleasant affect, NAD Skin:Warm and dry, brisk capillary refill HEENT:normocephalic, sclera clear, mucus membranes moist Neck:supple, no JVD, no bruits  Heart:S1S2 RRR without murmur, gallup, rub or click Lungs:clear without rales, rhonchi, or wheezes RDE:YCXK, non tender, + BS, do not palpate liver spleen or masses Ext:no lower ext edema, 2+ pedal pulses, 2+ radial pulses Neuro:alert and oriented, MAE, follows commands, + facial symmetry EKG:SR 82 1st degree av block  HR improved  ASSESSMENT AND PLAN Syncope No further episodes. At that monitor her with nonconducted PACs but also bradycardia and associated 2 to one block at times. Since her last visit her beta blockers has been completely stopped.  She continues on a low dose of flecainide. If she develops tachycardia or worsening bradycardia she would need a pacemaker at this point. This was briefly discussed with her.  Additionally her insurance company was concerned she may have carotid stenosis and I do not find carotid Dopplers in the computer therefore we will order this.  SUPRAVENTRICULAR TACHYCARDIA No current episodes  HYPERTENSION Controlled today. We could go up on losartan if   needed for hypertension  AV block, 1st degree No 2:1 block was seen on today's EKG, 1st degree continues, the patient has been asymptomatic  Thoracic aortic aneurysm No chest pain    Followup with Dr. Sallyanne Kuster in 6 months unless problems in the meantime

## 2014-02-12 ENCOUNTER — Telehealth (HOSPITAL_COMMUNITY): Payer: Self-pay | Admitting: *Deleted

## 2014-02-13 ENCOUNTER — Telehealth (HOSPITAL_COMMUNITY): Payer: Self-pay | Admitting: *Deleted

## 2014-02-13 NOTE — Assessment & Plan Note (Signed)
No further episodes. At that monitor her with nonconducted PACs but also bradycardia and associated 2 to one block at times. Since her last visit her beta blockers has been completely stopped.  She continues on a low dose of flecainide. If she develops tachycardia or worsening bradycardia she would need a pacemaker at this point. This was briefly discussed with her.  Additionally her insurance company was concerned she may have carotid stenosis and I do not find carotid Dopplers in the computer therefore we will order this.

## 2014-02-13 NOTE — Assessment & Plan Note (Signed)
No 2:1 block was seen on today's EKG, 1st degree continues, the patient has been asymptomatic

## 2014-02-13 NOTE — Assessment & Plan Note (Signed)
Controlled today. We could go up on losartan if  needed for hypertension

## 2014-02-13 NOTE — Assessment & Plan Note (Signed)
No current episodes

## 2014-02-13 NOTE — Assessment & Plan Note (Signed)
No chest pain

## 2014-02-14 ENCOUNTER — Telehealth: Payer: Self-pay | Admitting: Cardiology

## 2014-02-14 NOTE — Telephone Encounter (Signed)
She want you to call her, regarding her mother.

## 2014-02-19 ENCOUNTER — Ambulatory Visit (HOSPITAL_COMMUNITY)
Admission: RE | Admit: 2014-02-19 | Discharge: 2014-02-19 | Disposition: A | Discharge status: Home / Self Care | Payer: Medicare Other | Source: Ambulatory Visit | Attending: Cardiology | Admitting: Cardiology

## 2014-02-19 ENCOUNTER — Telehealth: Payer: Self-pay | Admitting: Cardiology

## 2014-02-19 ENCOUNTER — Telehealth: Payer: Self-pay | Admitting: Cardiovascular Disease

## 2014-02-19 DIAGNOSIS — R55 Syncope and collapse: Secondary | ICD-10-CM | POA: Insufficient documentation

## 2014-02-19 MED ORDER — FLECAINIDE ACETATE 50 MG PO TABS: 25.0000 mg | ORAL_TABLET | Freq: Two times a day (BID) | ORAL | Status: DC

## 2014-02-19 NOTE — Addendum Note (Signed)
Addended by: Fidel Levy on: 02/19/2014 02:28 PM   Modules accepted: Orders

## 2014-02-19 NOTE — Telephone Encounter (Signed)
New Prob    Requesting direction/quantity clarification for Synthroid and Tambocor. Please call.

## 2014-02-19 NOTE — Progress Notes (Signed)
Carotid Duplex Completed. °Brianna L Mazza,RVT °

## 2014-02-19 NOTE — Telephone Encounter (Signed)
Called [name] her daughter to discuss office visit.  Her HR is stable off BB.  2 concerns -one is her aorta and she would do better on BB,  Two is hx SVT- she is at risk for going back into.  She may need to have PPM to allow BB with her 2:1 block and help prevent problems with her aorta.  Pt is not sure about PPM.  Will send copy of this to Dr. Sallyanne Kuster for further instructions.

## 2014-02-19 NOTE — Telephone Encounter (Signed)
RN called pharmacy to clarify med instructions. Per Mickel Baas, NP - patient is to take half of 50mg  tablet (25mg ) by mouth twice daily.  Synthroid clarification to be deferred to PCP/endocrinologist.

## 2014-02-25 ENCOUNTER — Telehealth: Payer: Self-pay | Admitting: Cardiovascular Disease

## 2014-02-25 MED ORDER — FLECAINIDE ACETATE 50 MG PO TABS: 25.0000 mg | ORAL_TABLET | Freq: Two times a day (BID) | ORAL | Status: DC

## 2014-02-25 NOTE — Telephone Encounter (Signed)
Need a refill on Tambocor 50 mg #90.Please call to [facility].

## 2014-02-25 NOTE — Telephone Encounter (Signed)
Refill for Tambocor sent to [facility].

## 2014-02-26 NOTE — Telephone Encounter (Signed)
Already taken care of by laura

## 2014-03-04 NOTE — Telephone Encounter (Signed)
She wants to avoid PPM

## 2014-03-26 ENCOUNTER — Telehealth: Payer: Self-pay | Admitting: Cardiology

## 2014-03-26 MED ORDER — LOSARTAN POTASSIUM 50 MG PO TABS: 50.0000 mg | ORAL_TABLET | Freq: Every day | ORAL | Status: DC

## 2014-03-26 NOTE — Telephone Encounter (Signed)
Yvonne [name] was told to call back regarding [name]'s blood pressure.  Patient is scheduled for surgery later this week and does not want to cancel due to her blood pressure.

## 2014-03-26 NOTE — Telephone Encounter (Signed)
Please double losartan to 50 mg daily starting now

## 2014-03-26 NOTE — Telephone Encounter (Signed)
Spoke with pt dtr, she was told to report bp readings of above 140 since her metoprolol was stopped. By her reports the pts bp is running consistently running 150-130's. Yesterday it was 176-200. She currently taking losartan 25 mg once daily Will forward for dr c review.

## 2014-03-26 NOTE — Telephone Encounter (Signed)
Spoke with pt dtr, aware of dr croitoru's recommendation. She will call back of where to send a new script

## 2014-03-27 ENCOUNTER — Other Ambulatory Visit: Payer: Self-pay | Admitting: Neurosurgery

## 2014-03-27 ENCOUNTER — Ambulatory Visit
Admission: RE | Admit: 2014-03-27 | Discharge: 2014-03-27 | Disposition: A | Discharge status: Home / Self Care | Payer: Medicare Other | Source: Ambulatory Visit | Attending: Neurosurgery | Admitting: Neurosurgery

## 2014-03-27 DIAGNOSIS — S32009A Unspecified fracture of unspecified lumbar vertebra, initial encounter for closed fracture: Secondary | ICD-10-CM

## 2014-03-31 ENCOUNTER — Other Ambulatory Visit: Payer: Self-pay | Admitting: Neurosurgery

## 2014-03-31 ENCOUNTER — Encounter (HOSPITAL_COMMUNITY): Payer: Self-pay | Admitting: Pharmacy Technician

## 2014-03-31 ENCOUNTER — Encounter (HOSPITAL_COMMUNITY): Payer: Self-pay | Admitting: *Deleted

## 2014-03-31 MED ORDER — CEFAZOLIN SODIUM-DEXTROSE 2-3 GM-% IV SOLR
2.0000 g | INTRAVENOUS | Status: AC
  Administered 2014-04-01: 2 g via INTRAVENOUS
  Filled 2014-03-31: qty 50

## 2014-03-31 NOTE — H&P (Addendum)
BP 221/70  Pulse 72  Temp(Src) 97.6 F (36.4 C) (Oral)  SpO2 99%  HOPI:                                                   Yvonne [name] returns today.  She's had two previous kyphoplasty done by me at L1 and at L2.  She has done well since last year with regards to her back.  She did have a pelvic fracture about one month after I did the kyphoplasty of the spine last year.  She returns today with acute onset of pain last week Thursday while she was pushing a piece of furniture with her leg.  She turned felt a pop and had severe pain lower then where she had before.  Looking at plain x-ray I do believe that she has a new fracture at L4, but I don't have a good film to compare too.  What I will do is send her for CT to evaluate an L4 compression fracture.  If indeed it is there then she would like to proceed with kyphoplasty as she has done before.     MEDICATIONS:                         She takes losartan.     ALLERGIES:                                         No known drug allergies.     PHYSICAL EXAMINATION:                    Height is 66 inches, weight 124 pounds, BMI is 20.01, blood pressure is 211/73, pulse is 80, and temperature is 97.  Pain was not rated.  On exam, she is sitting in a wheelchair in some discomfort.  5/5 strength in lower extremities.  Reflexes intact at knees.  Trace at the ankles.  Good adduction, good abduction of the thigh.  Intact light touch.  Normal muscle tone and bulk.  Coordination is also normal.  She is alert and oriented x4 and answering all questions appropriately.  Symmetric facies.  Hearing intact to voice.     IMAGING STUDIES:                              I will order the CT of the lumbar spine and then see Yvonne [name] in the office, but she would like to proceed with the kyphoplasty.  If the CT is positive frankly I  will just give them a call and will get her scheduled as we did discuss it today and that's how  she would like to proceed.Ct shows new  fracture at L4. She understands the risks and benefits having had the procedure and would like to proceed.

## 2014-03-31 NOTE — Progress Notes (Signed)
Anesthesia Chart Review:  Patient is a 80 year old female scheduled for L4 kyphoplasty tomorrow.  She was a late add-on today, so she is scheduled to be a same day work-up.  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 Servando Snare, he felt dilated arch was about 5.3 cm and due to patient's age and fragility did not recommend operative intervention nor was she interested and CTA in 1 year recommended), afib/PAF, MVP, moderate AR and possible small restrictive membranous VSD by 11/2013 echo, HTN, hypothyroidism, GERD, non-smoker.  She was recently evaluated by cardiology for syncope 11/2013, felt likely related to bradycardia (had bradycardiac with intermittent 2:1 block on event monitor) and her b-blocker was discontinued and flecainide decreased.  According to notations by Dr. Sallyanne Kuster, she was not interested in being considered for pacemaker insertion.  Echo on showed:  - Left ventricle: The cavity size was normal. There was mild focal basal hypertrophy of the septum. Systolic function was normal. The estimated ejection fraction was in the range of 60% to 65%. Images were inadequate for LV wall motion assessment. Doppler parameters are consistent with pseudonormalleft ventricular relaxation (grade2 diastolic dysfunction). The E/A ratio is >2. The E/e' ratio is >15, suggesting elevated LV filling pressure. - Ventricular septum: Possible small restrictive membranous VSD. - Aortic valve: Poorly visualized. Moderate regurgitation. - Mitral valve: Very thick mitral leaflets, especially the anterior leaflet. Mild regurgitation. Valve area by pressure half-time: 2.39cm^2. - Left atrium: LA Volume/BSA= 31.3 ml/m2. The atrium was mildly dilated. - Tricuspid valve: Mild regurgitation. - Pulmonary arteries: PA peak  pressure: 83mm Hg (S). - Inferior vena cava: The vessel was normal in size; the respirophasic diameter changes were in the normal range (= 50%); findings are consistent with normal central venous pressure.  Nuclear stress test on 05/04/10 showed: Normal myocardial imaging, post-stress EF 72%, no significant wall motion abnormalities.  EKG on 02/10/14 showed SB with first degree AVB.Septal infarct, age undetermined.  Carotid duplex on 02/19/14 showed: Bilateral bulb/proximal ICAs demonstrated a mild amount of fibrous plaque with no evidence of significant diameter reduction, tortuosity, or other vascular abnormality. Diminished diastolic flow visualized of the bilateral CCA/ICA.  She will need a CXR and labs tomorrow on arrival.  Above reviewed with anesthesiologist Dr. Linna Caprice.  She has had no further syncope following medication adjustment and is not interested in a PPM anyway. CT surgery did not recommend intervention of her TAA following her 05/2013 CTA.  She will be further evaluated on the day of surgery, but if no acute changes then it is anticipated that she can proceed as planned.   [facility]/Anesthesiology Phone [phone] 03/31/2014 5:44 PM

## 2014-04-01 ENCOUNTER — Inpatient Hospital Stay (HOSPITAL_COMMUNITY): Payer: Medicare Other | Admitting: Vascular Surgery

## 2014-04-01 ENCOUNTER — Inpatient Hospital Stay (HOSPITAL_COMMUNITY)
Admission: RE | Admit: 2014-04-01 | Discharge: 2014-04-01 | DRG: 517 | Disposition: A | Discharge status: Skilled Nursing Facility | Payer: Medicare Other | Source: Ambulatory Visit | Attending: Neurosurgery | Admitting: Neurosurgery

## 2014-04-01 ENCOUNTER — Inpatient Hospital Stay (HOSPITAL_COMMUNITY): Payer: Medicare Other

## 2014-04-01 ENCOUNTER — Encounter (HOSPITAL_COMMUNITY): Payer: Medicare Other | Admitting: Vascular Surgery

## 2014-04-01 ENCOUNTER — Encounter (HOSPITAL_COMMUNITY)
Admission: RE | Disposition: A | Discharge status: Skilled Nursing Facility | Payer: Self-pay | Source: Ambulatory Visit | Attending: Neurosurgery

## 2014-04-01 ENCOUNTER — Encounter (HOSPITAL_COMMUNITY): Payer: Self-pay | Admitting: *Deleted

## 2014-04-01 DIAGNOSIS — E039 Hypothyroidism, unspecified: Secondary | ICD-10-CM | POA: Diagnosis present

## 2014-04-01 DIAGNOSIS — I4891 Unspecified atrial fibrillation: Secondary | ICD-10-CM | POA: Diagnosis present

## 2014-04-01 DIAGNOSIS — M129 Arthropathy, unspecified: Secondary | ICD-10-CM | POA: Diagnosis present

## 2014-04-01 DIAGNOSIS — X500XXA Overexertion from strenuous movement or load, initial encounter: Secondary | ICD-10-CM | POA: Diagnosis present

## 2014-04-01 DIAGNOSIS — R011 Cardiac murmur, unspecified: Secondary | ICD-10-CM | POA: Diagnosis present

## 2014-04-01 DIAGNOSIS — S32009A Unspecified fracture of unspecified lumbar vertebra, initial encounter for closed fracture: Secondary | ICD-10-CM | POA: Diagnosis present

## 2014-04-01 DIAGNOSIS — S22000A Wedge compression fracture of unspecified thoracic vertebra, initial encounter for closed fracture: Secondary | ICD-10-CM | POA: Diagnosis present

## 2014-04-01 DIAGNOSIS — K219 Gastro-esophageal reflux disease without esophagitis: Secondary | ICD-10-CM | POA: Diagnosis present

## 2014-04-01 DIAGNOSIS — Y92009 Unspecified place in unspecified non-institutional (private) residence as the place of occurrence of the external cause: Secondary | ICD-10-CM

## 2014-04-01 DIAGNOSIS — S32040A Wedge compression fracture of fourth lumbar vertebra, initial encounter for closed fracture: Secondary | ICD-10-CM

## 2014-04-01 HISTORY — DX: Cardiac murmur, unspecified: R01.1

## 2014-04-01 HISTORY — PX: KYPHOPLASTY: SHX5884

## 2014-04-01 HISTORY — DX: Syncope and collapse: R55

## 2014-04-01 HISTORY — DX: Pneumonia, unspecified organism: J18.9

## 2014-04-01 LAB — BASIC METABOLIC PANEL
ANION GAP: 11 (ref 5–15)
BUN: 24 mg/dL — ABNORMAL HIGH (ref 6–23)
CHLORIDE: 103 meq/L (ref 96–112)
CO2: 26 meq/L (ref 19–32)
Calcium: 9.1 mg/dL (ref 8.4–10.5)
Creatinine, Ser: 1.14 mg/dL — ABNORMAL HIGH (ref 0.50–1.10)
GFR calc Af Amer: 49 mL/min — ABNORMAL LOW (ref >90)
GFR calc non Af Amer: 43 mL/min — ABNORMAL LOW (ref >90)
Glucose, Bld: 95 mg/dL (ref 70–99)
POTASSIUM: 4.5 meq/L (ref 3.7–5.3)
Sodium: 140 mEq/L (ref 137–147)

## 2014-04-01 LAB — CBC
HEMATOCRIT: 37.7 % (ref 36.0–46.0)
HEMOGLOBIN: 12 g/dL (ref 12.0–15.0)
MCH: 29.4 pg (ref 26.0–34.0)
MCHC: 31.8 g/dL (ref 30.0–36.0)
MCV: 92.4 fL (ref 78.0–100.0)
Platelets: 173 10*3/uL (ref 150–400)
RBC: 4.08 MIL/uL (ref 3.87–5.11)
RDW: 14.3 % (ref 11.5–15.5)
WBC: 5.9 10*3/uL (ref 4.0–10.5)

## 2014-04-01 SURGERY — KYPHOPLASTY
Anesthesia: General

## 2014-04-01 MED ORDER — POTASSIUM CHLORIDE IN NACL 20-0.9 MEQ/L-% IV SOLN
INTRAVENOUS | Status: DC
  Filled 2014-04-01 (×2): qty 1000

## 2014-04-01 MED ORDER — LACTATED RINGERS IV SOLN
INTRAVENOUS | Status: DC | PRN
  Administered 2014-04-01: 07:00:00 via INTRAVENOUS

## 2014-04-01 MED ORDER — ONDANSETRON HCL 4 MG/2ML IJ SOLN: 4.0000 mg | Freq: Once | INTRAMUSCULAR | Status: DC | PRN

## 2014-04-01 MED ORDER — LIDOCAINE HCL (CARDIAC) 20 MG/ML IV SOLN
INTRAVENOUS | Status: AC
  Filled 2014-04-01: qty 5

## 2014-04-01 MED ORDER — IOHEXOL 300 MG/ML  SOLN
INTRAMUSCULAR | Status: DC | PRN
  Administered 2014-04-01: 50 mL via INTRAVENOUS

## 2014-04-01 MED ORDER — PROPOFOL 10 MG/ML IV BOLUS
INTRAVENOUS | Status: AC
  Filled 2014-04-01: qty 20

## 2014-04-01 MED ORDER — LOSARTAN POTASSIUM 50 MG PO TABS: 50.0000 mg | ORAL_TABLET | Freq: Every day | ORAL | Status: DC

## 2014-04-01 MED ORDER — FLECAINIDE ACETATE 50 MG PO TABS
25.0000 mg | ORAL_TABLET | Freq: Two times a day (BID) | ORAL | Status: DC
  Filled 2014-04-01: qty 1

## 2014-04-01 MED ORDER — ROCURONIUM BROMIDE 100 MG/10ML IV SOLN
INTRAVENOUS | Status: DC | PRN
  Administered 2014-04-01: 30 mg via INTRAVENOUS

## 2014-04-01 MED ORDER — LEVOTHYROXINE SODIUM 50 MCG PO TABS
50.0000 ug | ORAL_TABLET | Freq: Every day | ORAL | Status: DC
  Filled 2014-04-01: qty 1

## 2014-04-01 MED ORDER — ONDANSETRON HCL 4 MG/2ML IJ SOLN
INTRAMUSCULAR | Status: DC | PRN
  Administered 2014-04-01: 4 mg via INTRAVENOUS

## 2014-04-01 MED ORDER — NEOSTIGMINE METHYLSULFATE 10 MG/10ML IV SOLN
INTRAVENOUS | Status: DC | PRN
  Administered 2014-04-01: 3 mg via INTRAVENOUS

## 2014-04-01 MED ORDER — LOSARTAN POTASSIUM 50 MG PO TABS
50.0000 mg | ORAL_TABLET | Freq: Once | ORAL | Status: DC
  Filled 2014-04-01: qty 1

## 2014-04-01 MED ORDER — EPHEDRINE SULFATE 50 MG/ML IJ SOLN
INTRAMUSCULAR | Status: DC | PRN
  Administered 2014-04-01: 10 mg via INTRAVENOUS
  Administered 2014-04-01: 5 mg via INTRAVENOUS
  Administered 2014-04-01 (×2): 10 mg via INTRAVENOUS

## 2014-04-01 MED ORDER — GLYCOPYRROLATE 0.2 MG/ML IJ SOLN
INTRAMUSCULAR | Status: DC | PRN
  Administered 2014-04-01: .6 mg via INTRAVENOUS

## 2014-04-01 MED ORDER — ONDANSETRON HCL 4 MG/2ML IJ SOLN: 2.0000 mg | Freq: Four times a day (QID) | INTRAMUSCULAR | Status: DC | PRN

## 2014-04-01 MED ORDER — LIDOCAINE HCL (CARDIAC) 20 MG/ML IV SOLN
INTRAVENOUS | Status: DC | PRN
  Administered 2014-04-01: 70 mg via INTRAVENOUS

## 2014-04-01 MED ORDER — PROPOFOL 10 MG/ML IV BOLUS
INTRAVENOUS | Status: DC | PRN
  Administered 2014-04-01: 150 mg via INTRAVENOUS

## 2014-04-01 MED ORDER — PROCHLORPERAZINE 25 MG RE SUPP
25.0000 mg | Freq: Two times a day (BID) | RECTAL | Status: DC | PRN
  Filled 2014-04-01: qty 1

## 2014-04-01 MED ORDER — FENTANYL CITRATE 0.05 MG/ML IJ SOLN
INTRAMUSCULAR | Status: AC
  Filled 2014-04-01: qty 5

## 2014-04-01 MED ORDER — FENTANYL CITRATE 0.05 MG/ML IJ SOLN
INTRAMUSCULAR | Status: DC | PRN
  Administered 2014-04-01: 100 ug via INTRAVENOUS

## 2014-04-01 MED ORDER — 0.9 % SODIUM CHLORIDE (POUR BTL) OPTIME
TOPICAL | Status: DC | PRN
  Administered 2014-04-01: 1000 mL

## 2014-04-01 MED ORDER — LIDOCAINE-EPINEPHRINE 1 %-1:100000 IJ SOLN
INTRAMUSCULAR | Status: DC | PRN
  Administered 2014-04-01: 20 mL

## 2014-04-01 MED ORDER — HYDROCODONE-ACETAMINOPHEN 5-325 MG PO TABS: 1.0000 | ORAL_TABLET | ORAL | Status: DC | PRN

## 2014-04-01 MED ORDER — ONDANSETRON HCL 4 MG/2ML IJ SOLN
INTRAMUSCULAR | Status: AC
  Filled 2014-04-01: qty 2

## 2014-04-01 MED ORDER — ROCURONIUM BROMIDE 50 MG/5ML IV SOLN
INTRAVENOUS | Status: AC
  Filled 2014-04-01: qty 1

## 2014-04-01 MED ORDER — OXYCODONE HCL 5 MG PO TABS: 5.0000 mg | ORAL_TABLET | ORAL | Status: DC | PRN

## 2014-04-01 MED ORDER — FENTANYL CITRATE 0.05 MG/ML IJ SOLN: 25.0000 ug | INTRAMUSCULAR | Status: DC | PRN

## 2014-04-01 MED ORDER — MORPHINE SULFATE 2 MG/ML IJ SOLN: 2.0000 mg | INTRAMUSCULAR | Status: DC | PRN

## 2014-04-01 SURGICAL SUPPLY — 47 items
ADH SKN CLS APL DERMABOND .7 (GAUZE/BANDAGES/DRESSINGS) ×1
ADH SKN CLS LQ APL DERMABOND (GAUZE/BANDAGES/DRESSINGS) ×1
BLADE SURG 15 STRL LF DISP TIS (BLADE) ×1 IMPLANT
BLADE SURG 15 STRL SS (BLADE) ×6
BLADE SURG ROTATE 9660 (MISCELLANEOUS) IMPLANT
CEMENT BONE KYPHX HV R (Orthopedic Implant) ×2 IMPLANT
CEMENT KYPHON C01A KIT/MIXER (Cement) ×2 IMPLANT
CONT SPEC 4OZ CLIKSEAL STRL BL (MISCELLANEOUS) ×6 IMPLANT
DERMABOND ADHESIVE PROPEN (GAUZE/BANDAGES/DRESSINGS) ×2
DERMABOND ADVANCED (GAUZE/BANDAGES/DRESSINGS) ×2
DERMABOND ADVANCED .7 DNX12 (GAUZE/BANDAGES/DRESSINGS) ×1 IMPLANT
DERMABOND ADVANCED .7 DNX6 (GAUZE/BANDAGES/DRESSINGS) IMPLANT
DRAPE C-ARM 42X72 X-RAY (DRAPES) ×3 IMPLANT
DRAPE LAPAROTOMY 100X72X124 (DRAPES) ×3 IMPLANT
DRAPE PROXIMA HALF (DRAPES) ×3 IMPLANT
DRAPE SURG 17X23 STRL (DRAPES) ×3 IMPLANT
DRSG TELFA 3X8 NADH (GAUZE/BANDAGES/DRESSINGS) IMPLANT
DURAPREP 26ML APPLICATOR (WOUND CARE) ×3 IMPLANT
GAUZE SPONGE 4X4 16PLY XRAY LF (GAUZE/BANDAGES/DRESSINGS) ×3 IMPLANT
GLOVE BIOGEL PI IND STRL 7.5 (GLOVE) IMPLANT
GLOVE BIOGEL PI INDICATOR 7.5 (GLOVE) ×2
GLOVE ECLIPSE 6.5 STRL STRAW (GLOVE) ×3 IMPLANT
GLOVE ECLIPSE 7.5 STRL STRAW (GLOVE) ×2 IMPLANT
GLOVE EXAM NITRILE LRG STRL (GLOVE) IMPLANT
GLOVE EXAM NITRILE MD LF STRL (GLOVE) IMPLANT
GLOVE EXAM NITRILE XL STR (GLOVE) IMPLANT
GLOVE EXAM NITRILE XS STR PU (GLOVE) IMPLANT
GOWN STRL REUS W/ TWL LRG LVL3 (GOWN DISPOSABLE) ×2 IMPLANT
GOWN STRL REUS W/ TWL XL LVL3 (GOWN DISPOSABLE) IMPLANT
GOWN STRL REUS W/TWL 2XL LVL3 (GOWN DISPOSABLE) IMPLANT
GOWN STRL REUS W/TWL LRG LVL3 (GOWN DISPOSABLE) ×3
GOWN STRL REUS W/TWL XL LVL3 (GOWN DISPOSABLE)
KIT BASIN OR (CUSTOM PROCEDURE TRAY) ×3 IMPLANT
KIT ROOM TURNOVER OR (KITS) ×3 IMPLANT
NDL HYPO 25X1 1.5 SAFETY (NEEDLE) ×1 IMPLANT
NEEDLE HYPO 25X1 1.5 SAFETY (NEEDLE) ×3 IMPLANT
NS IRRIG 1000ML POUR BTL (IV SOLUTION) ×3 IMPLANT
PACK SURGICAL SETUP 50X90 (CUSTOM PROCEDURE TRAY) ×3 IMPLANT
PAD ARMBOARD 7.5X6 YLW CONV (MISCELLANEOUS) ×9 IMPLANT
PAD DRESSING TELFA 3X8 NADH (GAUZE/BANDAGES/DRESSINGS) IMPLANT
SPECIMEN JAR SMALL (MISCELLANEOUS) IMPLANT
STAPLER SKIN PROX WIDE 3.9 (STAPLE) ×3 IMPLANT
SUT VIC AB 3-0 SH 8-18 (SUTURE) ×3 IMPLANT
SYR CONTROL 10ML LL (SYRINGE) ×6 IMPLANT
TOWEL OR 17X24 6PK STRL BLUE (TOWEL DISPOSABLE) ×3 IMPLANT
TOWEL OR 17X26 10 PK STRL BLUE (TOWEL DISPOSABLE) ×3 IMPLANT
TRAY KYPHOPAK 20/3 ONESTEP 1ST (MISCELLANEOUS) ×2 IMPLANT

## 2014-04-01 NOTE — Op Note (Signed)
04/01/2014  8:39 AM  PATIENT:  Yvonne [name]  80 y.o. female  PRE-OPERATIVE DIAGNOSIS:  L4 compression fracture  POST-OPERATIVE DIAGNOSIS:  Lumbar Four Compression Fracture  PROCEDURE:  Procedure(s): KYPHOPLASTY L4  SURGEON:  Surgeon(s): Winfield Cunas, MD  ANESTHESIA:   general  EBL:     BLOOD ADMINISTERED:none  COUNT:per nursing  SPECIMEN:  No Specimen  DICTATION: [name] was taken to the operating room, intubated and placed under a general anesthetic without difficulty. She was positioned prone on the operating room table with all pressure points properly padded. Her back was prepped and draped in a sterile manner. With fluoroscopy I localized the L4 pedicles bilaterally. I injected lidocaine into the entry sites on both the left and right sides. I started by making a stab incision on the right and left  sides and entering the L4pedicle with fluoroscopic guidance. Once good position was obtained, I drilled into the vertebral body. I then placed the kyphoplasty balloon into the L4vertebra and inflated the balloon. I then inserted 4.5cc of methylmethacrylate into the vertebral body under fluoroscopic guidance on each side. I achieved a good fill of the cavity, appearing  staying within the confines of the vertebral body. I removed the instrumentation from the vertebral body, and the final films looked good. I closed the stab incision with vicryl suture and used Dermabond for a sterile dressing. Marland Kitchen    PLAN OF CARE: Admit to inpatient   PATIENT DISPOSITION:  PACU - hemodynamically stable.   Delay start of Pharmacological VTE agent (>24hrs) due to surgical blood loss or risk of bleeding:  yes

## 2014-04-01 NOTE — Discharge Instructions (Signed)
Wound Care Leave incision open to air. You may shower. Do not scrub directly on incision.  Do not put any creams, lotions, or ointments on incision. Activity Walk each and every day, increasing distance each day. No lifting greater than 5 lbs.  Avoid bending, arching, and twisting.  Diet Resume your normal diet.  Call Your Doctor If Any of These Occur Redness, drainage, or swelling at the wound.  Temperature greater than 101 degrees. Severe pain not relieved by pain medication. Incision starts to come apart. Follow Up Appt Call today for appointment in  [phone]) or for problems.

## 2014-04-01 NOTE — Anesthesia Preprocedure Evaluation (Signed)
Anesthesia Evaluation  Patient identified by MRN, date of birth, ID band Patient awake    Reviewed: Allergy & Precautions, H&P , NPO status , Patient's Chart, lab work & pertinent test results  Airway Mallampati: II  Neck ROM: full    Dental   Pulmonary shortness of breath,          Cardiovascular hypertension, + Peripheral Vascular Disease + dysrhythmias Atrial Fibrillation + Valvular Problems/Murmurs  H/o ascending aortic aneurysm s/p replacement with AVR.   Neuro/Psych    GI/Hepatic GERD-  ,  Endo/Other  Hypothyroidism   Renal/GU      Musculoskeletal  (+) Arthritis -,   Abdominal   Peds  Hematology   Anesthesia Other Findings   Reproductive/Obstetrics                           Anesthesia Physical Anesthesia Plan  ASA: III  Anesthesia Plan: General   Post-op Pain Management:    Induction: Intravenous  Airway Management Planned: Oral ETT  Additional Equipment:   Intra-op Plan:   Post-operative Plan: Extubation in OR  Informed Consent: I have reviewed the patients History and Physical, chart, labs and discussed the procedure including the risks, benefits and alternatives for the proposed anesthesia with the patient or authorized representative who has indicated his/her understanding and acceptance.     Plan Discussed with: CRNA, Anesthesiologist and Surgeon  Anesthesia Plan Comments:         Anesthesia Quick Evaluation

## 2014-04-01 NOTE — Transfer of Care (Signed)
Immediate Anesthesia Transfer of Care Note  Patient: Yvonne [name]  Procedure(s) Performed: Procedure(s) with comments: KYPHOPLASTY (N/A) - Lumbar Four Kyphoplasty  Patient Location: PACU  Anesthesia Type:General  Level of Consciousness: awake, alert  and oriented  Airway & Oxygen Therapy: Patient Spontanous Breathing  Post-op Assessment: Report given to PACU RN  Post vital signs: Reviewed and stable  Complications: No apparent anesthesia complications

## 2014-04-01 NOTE — Progress Notes (Signed)
Patient alert and oriented, mae's well, voiding adequate amount of urine, swallowing without difficulty, no c/o pain. Patient discharged home with family. Discharged instructions given to patient. Patient and family stated understanding of instructions given.  

## 2014-04-01 NOTE — Plan of Care (Signed)
Problem: Consults Goal: Diagnosis - Spinal Surgery Outcome: Completed/Met Date Met:  04/01/14 KYPHOPLASTY

## 2014-04-01 NOTE — Discharge Summary (Signed)
  Physician Discharge Summary  Patient ID: Yvonne [name] MRN: [id number] DOB/AGE: November 27, 1925 80 y.o.  Admit date: 04/01/2014 Discharge date: 04/01/2014  Admission Diagnoses:Compression fracture L4  Discharge Diagnoses:  Active Problems:   Compression fracture of body of thoracic vertebra   Compression fracture of L4 lumbar vertebra   Discharged Condition: good  Hospital Course: [name] is voiding, ambulating, and tolerating a regular diet. She had an uneventful Kyphoplasty of L4 today. Wounds are clean and dry.   Treatments: surgery: Kyphoplasty L4  Discharge Exam: Blood pressure 159/73, pulse 75, temperature 97.9 F (36.6 C), temperature source Oral, resp. rate 18, SpO2 93.00%. General appearance: alert, cooperative, appears stated age and no distress  Disposition: 03-Skilled Nursing Facility L4 compression fracture    Medication List         aspirin 325 MG tablet  Take 325 mg by mouth daily.     calcium-vitamin D 500-200 MG-UNIT per tablet  Commonly known as:  OSCAL WITH D  Take 1 tablet by mouth daily at 12 noon.     cholecalciferol 1000 UNITS tablet  Commonly known as:  VITAMIN D  Take 1,000 Units by mouth daily at 12 noon.     flecainide 50 MG tablet  Commonly known as:  TAMBOCOR  Take 0.5 tablets (25 mg total) by mouth 2 (two) times daily.     levothyroxine 50 MCG tablet  Commonly known as:  SYNTHROID, LEVOTHROID  Take 50 mcg by mouth every morning.     losartan 50 MG tablet  Commonly known as:  COZAAR  Take 1 tablet (50 mg total) by mouth daily.           Follow-up Information   Follow up with [name], MD. Schedule an appointment as soon as possible for a visit in 1 month.   Specialty:  Neurosurgery   Contact information:   [address] [place] [address] [phone]       Signed: Winfield Cunas 04/01/2014, 5:18 PM

## 2014-04-03 ENCOUNTER — Encounter (HOSPITAL_COMMUNITY): Payer: Self-pay | Admitting: Neurosurgery

## 2014-04-08 NOTE — Anesthesia Postprocedure Evaluation (Signed)
Anesthesia Post Note  Patient: Yvonne [name]  Procedure(s) Performed: Procedure(s) (LRB): KYPHOPLASTY (N/A)  Anesthesia type: General  Patient location: PACU  Post pain: Pain level controlled and Adequate analgesia  Post assessment: Post-op Vital signs reviewed, Patient's Cardiovascular Status Stable, Respiratory Function Stable, Patent Airway and Pain level controlled  Last Vitals:  Filed Vitals:   04/01/14 1605  BP: 159/73  Pulse: 75  Temp: 36.6 C  Resp: 18    Post vital signs: Reviewed and stable  Level of consciousness: awake, alert  and oriented  Complications: No apparent anesthesia complications

## 2014-04-25 ENCOUNTER — Telehealth: Payer: Self-pay | Admitting: Cardiovascular Disease

## 2014-04-25 MED ORDER — LOSARTAN POTASSIUM 50 MG PO TABS: 50.0000 mg | ORAL_TABLET | Freq: Every day | ORAL | Status: DC

## 2014-04-25 NOTE — Telephone Encounter (Signed)
Rx was sent to pharmacy electronically. Patient notified.  

## 2014-04-25 NOTE — Telephone Encounter (Signed)
Pt need a new prescription for Losartan 50 mg,because Mickel Baas increased it. Please call this to [facility] .

## 2014-05-21 ENCOUNTER — Ambulatory Visit (INDEPENDENT_AMBULATORY_CARE_PROVIDER_SITE_OTHER): Payer: Medicare Other | Admitting: Cardiovascular Disease

## 2014-05-21 ENCOUNTER — Encounter: Payer: Self-pay | Admitting: Cardiovascular Disease

## 2014-05-21 VITALS — BP 195/75 | HR 72 | Resp 16 | Ht 66.0 in | Wt 118.8 lb

## 2014-05-21 DIAGNOSIS — I359 Nonrheumatic aortic valve disorder, unspecified: Secondary | ICD-10-CM

## 2014-05-21 DIAGNOSIS — I351 Nonrheumatic aortic (valve) insufficiency: Secondary | ICD-10-CM

## 2014-05-21 MED ORDER — AMLODIPINE BESYLATE 5 MG PO TABS: 5.0000 mg | ORAL_TABLET | Freq: Every day | ORAL | Status: DC

## 2014-05-21 NOTE — Patient Instructions (Signed)
START Amlodipne 5mg  daily - a prescription has been sent to [facility] - rush order.  Your physician has requested that you regularly monitor and record your blood pressure readings at home. Please use the same machine at the same time of day to check your readings and record them to bring to your follow-up visit. Please call us with your readings 2 weeks after starting the Amlodipine.  Dr. Sallyanne Kuster recommends that you schedule a follow-up appointment in: 6 months.

## 2014-05-22 ENCOUNTER — Other Ambulatory Visit: Payer: Self-pay | Admitting: *Deleted

## 2014-05-22 DIAGNOSIS — I712 Thoracic aortic aneurysm, without rupture, unspecified: Secondary | ICD-10-CM

## 2014-05-25 ENCOUNTER — Encounter: Payer: Self-pay | Admitting: Cardiovascular Disease

## 2014-05-25 DIAGNOSIS — I351 Nonrheumatic aortic (valve) insufficiency: Secondary | ICD-10-CM

## 2014-05-25 HISTORY — DX: Nonrheumatic aortic (valve) insufficiency: I35.1

## 2014-05-25 NOTE — Progress Notes (Signed)
Patient ID: Yvonne [name], female   DOB: 1926/04/02, 80 y.o.   MRN: [id number]     Reason for office visit Atrial tachycardia, syncope, ascending aortic aneurysm rupture/repair, mitral valve prolapse  Yvonne [name] has a history of ruptured aneurysm of the ascending aorta complicated by pericardial tamponade and in 2011, surgically repaired with resuspension of the native aortic valve by Dr. Servando Snare. There is moderate aortic valve regurgitation. Left ventricular systolic function is normal. Doppler pattern suggests diastolic dysfunction. She has mitral valve prolapse with hemodynamically insignificant mitral insufficiency.  She has a long-standing history of symptomatic atrial tachycardia (AVNRT). Atrial tachycardia was successfully ablated by Dr. Thompson Grayer, but she continued to have highly symptomatic PACs, that have responded very well to flecainide, just as the tachycardia did before. Beta blocker therapy has been gradually reduced and eventually completely discontinued when she developed symptomatic bradycardia due to second degree AV block, Mobitz type I. She had syncope and the event monitor showed marked bradycardia due to heart block  She has osteoporosis and has required kyphoplasty for compression fractures, has treated hypothyroidism, gastroesophageal reflux disease and recurrent urinary tract infections.  She has no complaints today. Previous problems with weight loss appeared to have stable and. She has noticed that her blood pressure is persistently high, with systolics consistently at least in the 160-170 range with her home blood pressure monitor.  No Known Allergies  Current Outpatient Prescriptions  Medication Sig Dispense Refill  . aspirin 325 MG tablet Take 325 mg by mouth daily.      . calcium-vitamin D (OSCAL WITH D) 500-200 MG-UNIT per tablet Take 1 tablet by mouth daily at 12 noon.      . cholecalciferol (VITAMIN D) 1000 UNITS tablet Take 2,000 Units by mouth daily  at 12 noon.       . flecainide (TAMBOCOR) 50 MG tablet Take 0.5 tablets (25 mg total) by mouth 2 (two) times daily.  90 tablet  2  . levothyroxine (SYNTHROID, LEVOTHROID) 50 MCG tablet Take 50 mcg by mouth every morning.       Marland Kitchen losartan (COZAAR) 50 MG tablet Take 1 tablet (50 mg total) by mouth daily.  90 tablet  3  . traMADol (ULTRAM) 50 MG tablet Take 50 mg by mouth as needed.       Marland Kitchen amLODipine (NORVASC) 5 MG tablet Take 1 tablet (5 mg total) by mouth daily.  90 tablet  3   No current facility-administered medications for this visit.    Past Medical History  Diagnosis Date  . Thoracic aortic aneurysm 05/2010    Ruptured ascending  . History of atrial paroxysmal tachycardia   . History of hypothyroidism   . Compression fracture of L2 lumbar vertebra   . Acute blood loss anemia   . Volume overload   . Hypertension   . Mitral valve prolapse   . Hypothyroidism   . Afib     -no   . History of blood transfusion   . Shortness of breath     with  exertion  . UTI (lower urinary tract infection)   . GERD (gastroesophageal reflux disease)   . Arthritis   . Pericardial tamponade 05/2010  . Premature atrial contractions   . Syncope   . Heart murmur   . Pneumonia     Past Surgical History  Procedure Laterality Date  . Revision of left total hip replacement using some allograft  11/23/2010  . Classic atrioventricular nodal reentrant  10/20/2010  . L2  kyphoplasty using kyphon system.  03/17/2009  . Replacement of ascending aorta and hemiarch and resuspension of the aortic valve with circulatory cardiopulmonary bypass and circulatory arrest  06/25/2010    Dr Servando Snare  . Ablation    . Tubal ligation  1966  . Tonsillectomy    . Appendectomy    . Hernia repair  1993    Right Inguinal  . Eye surgery  2003    Bil  . Toe osteotomy      Right 2nd   . Percutaneous pinning wrist fracture  1995    bil  . Breast surgery      right biospy  . Kyphoplasty  09/05/2012    Procedure:  KYPHOPLASTY;  Surgeon: Winfield Cunas, MD;  Location: [place] [facility];  Service: Neurosurgery;  Laterality: N/A;  L1 kyphoplasty  . Colonoscopy    . Kyphoplasty N/A 04/01/2014    Procedure: KYPHOPLASTY;  Surgeon: Ashok Pall, MD;  Location: [place] [facility];  Service: Neurosurgery;  Laterality: N/A;  Lumbar Four Kyphoplasty    Family History  Problem Relation Age of Onset  . Heart failure Mother   . Stroke Father   . Hypertension Brother   . Hypertension Brother   . Hypertension Sister   . Hypertension Sister   . Stroke Sister     History   Social History  . Marital Status: Widowed    Spouse Name: N/A    Number of Children: N/A  . Years of Education: N/A   Occupational History  . Not on file.   Social History Main Topics  . Smoking status: Never Smoker   . Smokeless tobacco: Never Used  . Alcohol Use: No  . Drug Use: No  . Sexual Activity: Not on file   Other Topics Concern  . Not on file   Social History Narrative  . No narrative on file    Review of systems: The patient specifically denies any chest pain at rest or with exertion, dyspnea at rest or with exertion, orthopnea, paroxysmal nocturnal dyspnea, syncope, palpitations, focal neurological deficits, intermittent claudication, lower extremity edema, unexplained weight gain, cough, hemoptysis or wheezing.  The patient also denies abdominal pain, nausea, vomiting, dysphagia, diarrhea, constipation, polyuria, polydipsia, dysuria, hematuria, frequency, urgency, abnormal bleeding or bruising, fever, chills, unexpected weight changes, mood swings, change in skin or hair texture, change in voice quality, auditory or visual problems, allergic reactions or rashes, new musculoskeletal complaints other than usual "aches and pains".   PHYSICAL EXAM BP 195/75  Pulse 72  Ht 5\' 6"  (1.676 m)  Wt 53.887 kg (118 lb 12.8 oz)  BMI 19.18 kg/m2  General: Alert, oriented x3, no distress, very thin Head: no evidence of trauma, PERRL,  EOMI, no exophtalmos or lid lag, no myxedema, no xanthelasma; normal ears, nose and oropharynx Neck: normal jugular venous pulsations and no hepatojugular reflux; brisk carotid pulses without delay and no carotid bruits Chest: clear to auscultation, no signs of consolidation by percussion or palpation, normal fremitus, symmetrical and full respiratory excursions, sternotomy scar Cardiovascular: normal position and quality of the apical impulse, regular rhythm, normal first and second heart sounds, 3/6 decrescendo diastolic aortic murmur, midsystolic click, no systolic murmur at the apex, no rubs or gallops Abdomen: no tenderness or distention, no masses by palpation, no abnormal pulsatility or arterial bruits, normal bowel sounds, no hepatosplenomegaly Extremities: no clubbing, cyanosis or edema; 2+ radial, ulnar and brachial pulses bilaterally; 2+ right femoral, posterior tibial and dorsalis pedis pulses; 2+ left femoral, posterior  tibial and dorsalis pedis pulses; no subclavian or femoral bruits Neurological: grossly nonfocal   BMET    Component Value Date/Time   NA 140 04/01/2014 0633   K 4.5 04/01/2014 0633   CL 103 04/01/2014 0633   CO2 26 04/01/2014 0633   GLUCOSE 95 04/01/2014 0633   BUN 24* 04/01/2014 0633   CREATININE 1.14* 04/01/2014 0633   CREATININE 0.90 01/02/2014 1302   CALCIUM 9.1 04/01/2014 0633   GFRNONAA 43* 04/01/2014 0633   GFRNONAA 45* 12/19/2013 0920   GFRAA 49* 04/01/2014 0633   GFRAA 52* 12/19/2013 0920     ASSESSMENT AND PLAN Syncope  No further episodes. She continues on a low dose of flecainide. If she develops tachycardia or worsening bradycardia she would need a pacemaker at this point.   SUPRAVENTRICULAR TACHYCARDIA  No current episodes   HYPERTENSION  Systolic blood pressure is unacceptably high considering her history of ruptured aortic aneurysm. Losartan might offer some theoretical benefit since she appears to have a connective tissue disorder, but her creatinine has  recently bumped up. We'll start amlodipine 5 mg once daily.  AV block, 1st degree  History 021 AV block. Avoid negative inotropes. For the last several months the patient has been asymptomatic   Thoracic aortic aneurysm s/p repair No chest pain. She has a followup CT angiogram and visit with Dr. Servando Snare scheduled in early November  Aortic insufficiency, moderate This is quite loud on physical exam but does not appear to be hemodynamically important  Meds ordered this encounter  Medications  . traMADol (ULTRAM) 50 MG tablet    Sig: Take 50 mg by mouth as needed.   Marland Kitchen amLODipine (NORVASC) 5 MG tablet    Sig: Take 1 tablet (5 mg total) by mouth daily.    Dispense:  90 tablet    Refill:  3    Please rush order - new start    Jerney Baksh  Sanda Klein, MD, [facility] [phone] office [phone] pager

## 2014-05-27 ENCOUNTER — Other Ambulatory Visit: Payer: Self-pay | Admitting: *Deleted

## 2014-05-27 DIAGNOSIS — I712 Thoracic aortic aneurysm, without rupture, unspecified: Secondary | ICD-10-CM

## 2014-06-17 ENCOUNTER — Telehealth: Payer: Self-pay | Admitting: *Deleted

## 2014-06-17 NOTE — Telephone Encounter (Signed)
Patient notified Dr. Loletha Grayer review BP readings mailed in and he wants her to continue same medications (ranging 101/60 - 151/67  W/heart rates ranging 64-89).  Patient voiced understanding.

## 2014-06-30 ENCOUNTER — Other Ambulatory Visit: Payer: Self-pay | Admitting: Cardiothoracic Surgery

## 2014-06-30 ENCOUNTER — Other Ambulatory Visit: Payer: Self-pay | Admitting: *Deleted

## 2014-06-30 DIAGNOSIS — I712 Thoracic aortic aneurysm, without rupture, unspecified: Secondary | ICD-10-CM

## 2014-06-30 LAB — BUN: BUN: 17 mg/dL (ref 6–23)

## 2014-06-30 LAB — CREATININE, SERUM: Creat: 0.6 mg/dL (ref 0.50–1.10)

## 2014-07-03 ENCOUNTER — Encounter: Payer: Self-pay | Admitting: Cardiothoracic Surgery

## 2014-07-03 ENCOUNTER — Ambulatory Visit
Admission: RE | Admit: 2014-07-03 | Discharge: 2014-07-03 | Disposition: A | Discharge status: Home / Self Care | Payer: Medicare Other | Source: Ambulatory Visit | Attending: Cardiothoracic Surgery | Admitting: Cardiothoracic Surgery

## 2014-07-03 ENCOUNTER — Ambulatory Visit (INDEPENDENT_AMBULATORY_CARE_PROVIDER_SITE_OTHER): Payer: Medicare Other | Admitting: Cardiothoracic Surgery

## 2014-07-03 VITALS — BP 126/72 | HR 80 | Ht 66.0 in | Wt 121.0 lb

## 2014-07-03 DIAGNOSIS — I712 Thoracic aortic aneurysm, without rupture, unspecified: Secondary | ICD-10-CM

## 2014-07-03 DIAGNOSIS — I351 Nonrheumatic aortic (valve) insufficiency: Secondary | ICD-10-CM

## 2014-07-03 MED ORDER — IOHEXOL 350 MG/ML SOLN
65.0000 mL | Freq: Once | INTRAVENOUS | Status: AC | PRN
  Administered 2014-07-03: 65 mL via INTRAVENOUS

## 2014-07-03 NOTE — Progress Notes (Signed)
[address]       [place] [address]             [phone]          Gracious L Pledger [facility] Medical Record #[id number] Date of Birth: Apr 07, 1926  Referring: Sanda Klein, MD Primary Care: Lujean Amel, MD  Chief Complaint:    Chief Complaint  Patient presents with  . F/U THORACIC    1 YR F/U VISIT    History of Present Illness:    Patient returns today for followup CT scan of the chest after having repair of a ruptured ascending aortic aneurysm and dissection and resuspension of aortic valve done as an emergency 06/25/2010. Now 4 years postop she continues to be ambulatory and caring for herself. Her 88th birthday is tomorrow. She has had kyphoplasty x3. She works part-time at her church. She denies any overt symptoms of congestive heart failure.  Current Activity/ Functional Status: Walks with a cane, but otherwise is mobile   Past Medical History  Diagnosis Date  . Thoracic aortic aneurysm 05/2010    Ruptured ascending  . History of atrial paroxysmal tachycardia   . History of hypothyroidism   . Compression fracture of L2 lumbar vertebra   . Acute blood loss anemia   . Volume overload   . Hypertension   . Mitral valve prolapse   . Hypothyroidism   . Afib     -no   . History of blood transfusion   . Shortness of breath     with  exertion  . UTI (lower urinary tract infection)   . GERD (gastroesophageal reflux disease)   . Arthritis   . Pericardial tamponade 05/2010  . Premature atrial contractions   . Syncope   . Heart murmur   . Pneumonia   . Moderate aortic insufficiency 05/25/2014    Past Surgical History  Procedure Laterality Date  . Revision of left total hip replacement using some allograft  11/23/2010  . Classic atrioventricular nodal reentrant  10/20/2010  . L2 kyphoplasty using kyphon system.  03/17/2009  . Replacement of ascending aorta and hemiarch and resuspension of the aortic valve with circulatory  cardiopulmonary bypass and circulatory arrest  06/25/2010    Dr Servando Snare  . Ablation    . Tubal ligation  1966  . Tonsillectomy    . Appendectomy    . Hernia repair  1993    Right Inguinal  . Eye surgery  2003    Bil  . Toe osteotomy      Right 2nd   . Percutaneous pinning wrist fracture  1995    bil  . Breast surgery      right biospy  . Kyphoplasty  09/05/2012    Procedure: KYPHOPLASTY;  Surgeon: Winfield Cunas, MD;  Location: [facility];  Service: Neurosurgery;  Laterality: N/A;  L1 kyphoplasty  . Colonoscopy    . Kyphoplasty N/A 04/01/2014    Procedure: KYPHOPLASTY;  Surgeon: Ashok Pall, MD;  Location: [facility];  Service: Neurosurgery;  Laterality: N/A;  Lumbar Four Kyphoplasty    Family History  Problem Relation Age of Onset  . Heart failure Mother   . Stroke Father   . Hypertension Brother   . Hypertension Brother   . Hypertension Sister   . Hypertension Sister   . Stroke Sister     History   Social History  . Marital Status: Widowed    Spouse Name: N/A  Number of Children: N/A  . Years of Education: N/A   Occupational History  . Not on file.   Social History Main Topics  . Smoking status: Never Smoker   . Smokeless tobacco: Never Used  . Alcohol Use: No  . Drug Use: No  . Sexual Activity: Not on file   Other Topics Concern  . Not on file   Social History Narrative    History  Smoking status  . Never Smoker   Smokeless tobacco  . Never Used    History  Alcohol Use No     No Known Allergies  Current Outpatient Prescriptions  Medication Sig Dispense Refill  . amLODipine (NORVASC) 5 MG tablet Take 1 tablet (5 mg total) by mouth daily. 90 tablet 3  . aspirin 325 MG tablet Take 325 mg by mouth daily.    . calcium-vitamin D (OSCAL WITH D) 500-200 MG-UNIT per tablet Take 1 tablet by mouth daily at 12 noon.    . cholecalciferol (VITAMIN D) 1000 UNITS tablet Take 2,000 Units by mouth daily at 12 noon.     . flecainide (TAMBOCOR) 50 MG  tablet Take 0.5 tablets (25 mg total) by mouth 2 (two) times daily. 90 tablet 2  . levothyroxine (SYNTHROID, LEVOTHROID) 50 MCG tablet Take 50 mcg by mouth every morning.     Marland Kitchen losartan (COZAAR) 50 MG tablet Take 1 tablet (50 mg total) by mouth daily. 90 tablet 3  . traMADol (ULTRAM) 50 MG tablet Take 50 mg by mouth as needed.      No current facility-administered medications for this visit.       Physical Exam: BP 126/72 mmHg  Pulse 80  Ht 5\' 6"  (1.676 m)  Wt 121 lb (54.885 kg)  BMI 19.54 kg/m2  SpO2 91%  General appearance: alert, cooperative and cachectic Neurologic: intact Heart: regular rate and rhythm and diastolic murmur: mid diastolic 2/6, decrescendo at 2nd left intercostal space Lungs: clear to auscultation bilaterally and normal percussion bilaterally Abdomen: soft, non-tender; bowel sounds normal; no masses,  no organomegaly Extremities: extremities normal, atraumatic, no cyanosis or edema, Homans sign is negative, no sign of DVT and Patient has pain in the left hip from recent fall on previously replaced Wound: Sternal incision is stable and well healed   Diagnostic Studies & Laboratory data:     Recent Radiology Findings:  Ct Angio Chest Aorta W/cm &/or Wo/cm  07/03/2014   CLINICAL DATA:  Status post aortic valve repair and ascending thoracic aorta graft repair.  EXAM: CT ANGIOGRAPHY CHEST WITH CONTRAST  TECHNIQUE: Multidetector CT imaging of the chest was performed using the standard protocol during bolus administration of intravenous contrast. Multiplanar CT image reconstructions and MIPs were obtained to evaluate the vascular anatomy.  CONTRAST:  86mL OMNIPAQUE IOHEXOL 350 MG/ML SOLN  COMPARISON:  06/27/2013.  FINDINGS: Stable aortic graft extending from just above the aortic valve to the proximal arch. The ascending thoracic aorta chest above the level of the aortic valve currently measures 4.5 cm in maximum diameter and previously measured 4.9 cm in maximum  diameter. At a similar level in the coronal plane, the maximum diameter at that level is 4.1 cm and was previously 4.1 cm. The more superior aspect of the ascending thoracic aorta currently measures 5.7 cm in maximum diameter previously measured 5.8 cm in maximum diameter. The descending thoracic aorta remains normal in caliber. No acute dissection.  Small amount of atheromatous coronary artery calcification. Previously noted thoracolumbar vertebral compression deformities and vertebroplasty  material are unchanged. No new compression fractures. Mild scoliosis. Left parapelvic renal cysts are unchanged. No lung nodules or enlarged lymph nodes. Mild diffuse peribronchial thickening.  Review of the MIP images confirms the above findings.  IMPRESSION: 1. Stable ascending thoracic aortic aneurysm and a graft repair. 2. Small amount of atheromatous coronary artery calcification. 3. Chronic bronchitic changes.   Electronically Signed   By: Enrique Sack M.D.   On: 07/03/2014 12:47      Recent Lab Findings: Lab Results  Component Value Date   WBC 5.9 04/01/2014   HGB 12.0 04/01/2014   HCT 37.7 04/01/2014   PLT 173 04/01/2014   GLUCOSE 95 04/01/2014   ALT <8 12/19/2013   AST 16 12/19/2013   NA 140 04/01/2014   K 4.5 04/01/2014   CL 103 04/01/2014   CREATININE 0.60 06/30/2014   BUN 17 06/30/2014   CO2 26 04/01/2014   TSH 1.861 12/19/2013   INR 0.92 11/17/2010      Assessment / Plan:     Patient returns to the office with a followup CTA of the chest now 4years after emergency replacement of her acscending aorta resuspension of her aortic valve. She continues to do well postoperatively. CT scan shows dilated arch about 5.3 cm in size. With the patient's age and fragility operative intervention is not recommended. Aortic arch replacement is discussed with the patient but she is not interested in any intervention. We'll plan to see her back with a followup CTA of the chest in one year.    Grace Isaac MD  Beeper [phone] Office [phone] 07/03/2014 2:07 PM

## 2014-11-21 ENCOUNTER — Ambulatory Visit (INDEPENDENT_AMBULATORY_CARE_PROVIDER_SITE_OTHER): Payer: Medicare Other | Admitting: Cardiovascular Disease

## 2014-11-21 ENCOUNTER — Encounter: Payer: Self-pay | Admitting: Cardiovascular Disease

## 2014-11-21 VITALS — BP 172/60 | HR 74 | Resp 15 | Ht 66.0 in | Wt 120.6 lb

## 2014-11-21 DIAGNOSIS — I471 Supraventricular tachycardia: Secondary | ICD-10-CM | POA: Diagnosis not present

## 2014-11-21 DIAGNOSIS — I440 Atrioventricular block, first degree: Secondary | ICD-10-CM

## 2014-11-21 DIAGNOSIS — I351 Nonrheumatic aortic (valve) insufficiency: Secondary | ICD-10-CM | POA: Diagnosis not present

## 2014-11-21 DIAGNOSIS — I712 Thoracic aortic aneurysm, without rupture, unspecified: Secondary | ICD-10-CM

## 2014-11-21 DIAGNOSIS — I10 Essential (primary) hypertension: Secondary | ICD-10-CM

## 2014-11-21 MED ORDER — LOSARTAN POTASSIUM 50 MG PO TABS: ORAL_TABLET | ORAL | Status: DC

## 2014-11-21 NOTE — Progress Notes (Signed)
Patient ID: Yvonne [name], female   DOB: [birth year], 81 y.o.   MRN: [id number]     Cardiology Office Note   Date:  11/21/2014   ID:  Yvonne [name], DOB 12/17/1925, MRN [id number]  PCP:  Lujean Amel, MD  Cardiologist:   Sanda Klein, MD   Chief Complaint  Patient presents with  . Follow-up    no complaints      History of Present Illness: Yvonne [name] is a 81 y.o. female who presents for f/u for atrial tachycardia, syncope, ascending aortic aneurysm rupture/repair, mitral valve prolapse  She feels the best she has in years. She has no complaints. Her weight remains lower than normal, but better than last year. Her BP is consistently high, typically around 147/74 at home, lowest ever recorded recently 135/70. Her diastolic murmur is louder today.  [name] has a history of ruptured aneurysm of the ascending aorta complicated by pericardial tamponade and in 2011, surgically repaired with resuspension of the native aortic valve by Dr. Servando Snare. There is moderate aortic valve regurgitation. Left ventricular systolic function is normal. Doppler pattern suggests diastolic dysfunction. She has mitral valve prolapse with hemodynamically insignificant mitral insufficiency.  She has a long-standing history of symptomatic atrial tachycardia (AVNRT). Atrial tachycardia was successfully ablated by Dr. Thompson Grayer, but she continued to have highly symptomatic PACs, that have responded very well to flecainide, just as the tachycardia did before. Beta blocker therapy has been gradually reduced and eventually completely discontinued when she developed symptomatic bradycardia due to second degree AV block, Mobitz type I. She had syncope and the event monitor showed marked bradycardia due to heart block  She has osteoporosis and has required kyphoplasty for compression fractures, has treated hypothyroidism, gastroesophageal reflux disease and recurrent urinary tract infections.   Past  Medical History  Diagnosis Date  . Thoracic aortic aneurysm 05/2010    Ruptured ascending  . History of atrial paroxysmal tachycardia   . History of hypothyroidism   . Compression fracture of L2 lumbar vertebra   . Acute blood loss anemia   . Volume overload   . Hypertension   . Mitral valve prolapse   . Hypothyroidism   . Afib     -no   . History of blood transfusion   . Shortness of breath     with  exertion  . UTI (lower urinary tract infection)   . GERD (gastroesophageal reflux disease)   . Arthritis   . Pericardial tamponade 05/2010  . Premature atrial contractions   . Syncope   . Heart murmur   . Pneumonia   . Moderate aortic insufficiency 05/25/2014    Past Surgical History  Procedure Laterality Date  . Revision of left total hip replacement using some allograft  11/23/2010  . Classic atrioventricular nodal reentrant  10/20/2010  . L2 kyphoplasty using kyphon system.  03/17/2009  . Replacement of ascending aorta and hemiarch and resuspension of the aortic valve with circulatory cardiopulmonary bypass and circulatory arrest  06/25/2010    Dr Servando Snare  . Ablation    . Tubal ligation  1966  . Tonsillectomy    . Appendectomy    . Hernia repair  1993    Right Inguinal  . Eye surgery  2003    Bil  . Toe osteotomy      Right 2nd   . Percutaneous pinning wrist fracture  1995    bil  . Breast surgery      right biospy  . Kyphoplasty  09/05/2012  Procedure: KYPHOPLASTY;  Surgeon: Winfield Cunas, MD;  Location: [facility];  Service: Neurosurgery;  Laterality: N/A;  L1 kyphoplasty  . Colonoscopy    . Kyphoplasty N/A 04/01/2014    Procedure: KYPHOPLASTY;  Surgeon: Ashok Pall, MD;  Location: [facility];  Service: Neurosurgery;  Laterality: N/A;  Lumbar Four Kyphoplasty     Current Outpatient Prescriptions  Medication Sig Dispense Refill  . amLODipine (NORVASC) 5 MG tablet Take 1 tablet (5 mg total) by mouth daily. 90 tablet 3  . aspirin 325 MG tablet Take 325  mg by mouth daily.    . calcium-vitamin D (OSCAL WITH D) 500-200 MG-UNIT per tablet Take 1 tablet by mouth daily at 12 noon.    . cholecalciferol (VITAMIN D) 1000 UNITS tablet Take 2,000 Units by mouth daily at 12 noon.     . flecainide (TAMBOCOR) 50 MG tablet Take 0.5 tablets (25 mg total) by mouth 2 (two) times daily. 90 tablet 2  . levothyroxine (SYNTHROID, LEVOTHROID) 50 MCG tablet Take 50 mcg by mouth every morning.     Marland Kitchen losartan (COZAAR) 50 MG tablet 1 tablet in the morning and 1/2 tablet in the evening 135 tablet 3  . traMADol (ULTRAM) 50 MG tablet Take 50 mg by mouth as needed.      No current facility-administered medications for this visit.    Allergies:   Review of patient's allergies indicates no known allergies.    Social History:  The patient  reports that she has never smoked. She has never used smokeless tobacco. She reports that she does not drink alcohol or use illicit drugs.   Family History:  The patient's family history includes Heart failure in her mother; Hypertension in her brother, brother, sister, and sister; Stroke in her father and sister.    ROS:  Please see the history of present illness.    Otherwise, review of systems positive for none.   All other systems are reviewed and negative.    PHYSICAL EXAM: VS:  BP 172/60 mmHg  Pulse 74  Resp 15  Ht 5\' 6"  (1.676 m)  Wt 120 lb 9.6 oz (54.704 kg)  BMI 19.47 kg/m2 , BMI Body mass index is 19.47 kg/(m^2).  General: Alert, oriented x3, no distress, very thin Head: no evidence of trauma, PERRL, EOMI, no exophtalmos or lid lag, no myxedema, no xanthelasma; normal ears, nose and oropharynx Neck: normal jugular venous pulsations and no hepatojugular reflux; brisk carotid pulses without delay and no carotid bruits Chest: clear to auscultation, no signs of consolidation by percussion or palpation, normal fremitus, symmetrical and full respiratory excursions, sternotomy scar Cardiovascular: normal position and  quality of the apical impulse, regular rhythm, normal first and second heart sounds, 3/6 decrescendo diastolic aortic murmur - sounds louder than last year, midsystolic click, no systolic murmur at the apex, no rubs or gallops Abdomen: no tenderness or distention, no masses by palpation, no abnormal pulsatility or arterial bruits, normal bowel sounds, no hepatosplenomegaly Extremities: no clubbing, cyanosis or edema; 2+ radial, ulnar and brachial pulses bilaterally; 2+ right femoral, posterior tibial and dorsalis pedis pulses; 2+ left femoral, posterior tibial and dorsalis pedis pulses; no subclavian or femoral bruits Neurological: grossly nonfocal Psych: euthymic mood, full affect   EKG:  EKG is ordered today. The ekg ordered today demonstrates NSR, first degree AVB (238 ms), LVH, chronic repol changes, QTC 419ms   Recent Labs: 12/19/2013: ALT <8; Magnesium 1.9; TSH 1.861 04/01/2014: Hemoglobin 12.0; Platelets 173; Potassium 4.5; Sodium 140  06/30/2014: BUN 17; Creatinine 0.60    Lipid Panel No results found for: CHOL, TRIG, HDL, CHOLHDL, VLDL, LDLCALC, LDLDIRECT    Wt Readings from Last 3 Encounters:  11/21/14 120 lb 9.6 oz (54.704 kg)  07/03/14 121 lb (54.885 kg)  05/21/14 118 lb 12.8 oz (53.887 kg)      ASSESSMENT AND PLAN:  1.  HTN - target SBP< 130 due to history of aneurysm rupture and AI. Had swelling of the legs with higher dose amlodipine. Intolerant to beta blockers due to 2:1 AV block. Will increase losartan dose.  2. Aortic insufficiency - murmur is distinctly louder today - recheck echo  3. S/p Ascending aorta aneurysm repair - last saw Dr. Servando Snare in November. CTA with stable findings.  4. PAT/PACs - excellent symptom control with flecainide.  Current medicines are reviewed at length with the patient today.  The patient does not have concerns regarding medicines.  The following changes have been made:  Increase losartan dose  Labs/ tests ordered today include:    Orders Placed This Encounter  Procedures  . EKG 12-Lead  . 2D Echocardiogram without contrast    Patient Instructions  INCREASE your Losartan to 50mg  in the morning and 25mg  in the evening.  Your physician has requested that you have an echocardiogram. Echocardiography is a painless test that uses sound waves to create images of your heart. It provides your doctor with information about the size and shape of your heart and how well your heart's chambers and valves are working. This procedure takes approximately one hour. There are no restrictions for this procedure.  Dr.Chardai Gangemi wants you to follow-up in: 6 MONTHS. You will receive a reminder letter in the mail two months in advance. If you don't receive a letter, please call our office to schedule the follow-up appointment.      Mikael Spray, MD  11/21/2014 6:01 PM    Sanda Klein, MD, [facility] [phone] office [phone] pager

## 2014-11-21 NOTE — Patient Instructions (Signed)
INCREASE your Losartan to 50mg  in the morning and 25mg  in the evening.  Your physician has requested that you have an echocardiogram. Echocardiography is a painless test that uses sound waves to create images of your heart. It provides your doctor with information about the size and shape of your heart and how well your heart's chambers and valves are working. This procedure takes approximately one hour. There are no restrictions for this procedure.  Dr.Croitoru wants you to follow-up in: 6 MONTHS. You will receive a reminder letter in the mail two months in advance. If you don't receive a letter, please call our office to schedule the follow-up appointment.

## 2014-12-15 ENCOUNTER — Other Ambulatory Visit: Payer: Self-pay | Admitting: Cardiovascular Disease

## 2015-02-06 ENCOUNTER — Ambulatory Visit (HOSPITAL_COMMUNITY): Payer: Medicare Other

## 2015-02-06 ENCOUNTER — Ambulatory Visit (HOSPITAL_COMMUNITY)
Admission: RE | Admit: 2015-02-06 | Discharge: 2015-02-06 | Disposition: A | Discharge status: Home / Self Care | Payer: Medicare Other | Source: Ambulatory Visit | Attending: Cardiology | Admitting: Cardiology

## 2015-02-06 DIAGNOSIS — I351 Nonrheumatic aortic (valve) insufficiency: Secondary | ICD-10-CM

## 2015-02-06 NOTE — Progress Notes (Signed)
Patient ID: Yvonne [name], female   DOB: September 17, 1925, 81 y.o.   MRN: [id number]   Technically Difficult Echo due to VERY small body habitus, narrow rib spacing, lung shadowing, and scar tissue buildup.

## 2015-02-23 ENCOUNTER — Other Ambulatory Visit: Payer: Self-pay

## 2015-04-10 ENCOUNTER — Telehealth: Payer: Self-pay | Admitting: Cardiovascular Disease

## 2015-04-10 MED ORDER — LOSARTAN POTASSIUM 50 MG PO TABS: ORAL_TABLET | ORAL | Status: DC

## 2015-04-10 NOTE — Telephone Encounter (Signed)
[name] from [facility] is calling to get a new prescription sent to them Lorsartan 50mg  sent to them . Please call if you have any questions   Thanks

## 2015-04-10 NOTE — Telephone Encounter (Signed)
Refill submitted to patient's preferred pharmacy.  

## 2015-04-27 ENCOUNTER — Other Ambulatory Visit: Payer: Self-pay | Admitting: Cardiovascular Disease

## 2015-04-27 NOTE — Telephone Encounter (Signed)
REFILL 

## 2015-06-25 ENCOUNTER — Other Ambulatory Visit: Payer: Self-pay | Admitting: Cardiothoracic Surgery

## 2015-06-25 DIAGNOSIS — I712 Thoracic aortic aneurysm, without rupture, unspecified: Secondary | ICD-10-CM

## 2015-06-26 ENCOUNTER — Ambulatory Visit (INDEPENDENT_AMBULATORY_CARE_PROVIDER_SITE_OTHER): Payer: Medicare Other | Admitting: Cardiovascular Disease

## 2015-06-26 ENCOUNTER — Encounter: Payer: Self-pay | Admitting: Cardiovascular Disease

## 2015-06-26 VITALS — BP 134/58 | HR 66 | Resp 16 | Ht 66.0 in | Wt 119.6 lb

## 2015-06-26 DIAGNOSIS — E034 Atrophy of thyroid (acquired): Secondary | ICD-10-CM

## 2015-06-26 DIAGNOSIS — I10 Essential (primary) hypertension: Secondary | ICD-10-CM | POA: Diagnosis not present

## 2015-06-26 DIAGNOSIS — I4891 Unspecified atrial fibrillation: Secondary | ICD-10-CM | POA: Diagnosis not present

## 2015-06-26 DIAGNOSIS — I711 Thoracic aortic aneurysm, ruptured, unspecified: Secondary | ICD-10-CM

## 2015-06-26 DIAGNOSIS — E038 Other specified hypothyroidism: Secondary | ICD-10-CM

## 2015-06-26 DIAGNOSIS — I441 Atrioventricular block, second degree: Secondary | ICD-10-CM

## 2015-06-26 DIAGNOSIS — Z79899 Other long term (current) drug therapy: Secondary | ICD-10-CM

## 2015-06-26 DIAGNOSIS — I471 Supraventricular tachycardia: Secondary | ICD-10-CM

## 2015-06-26 DIAGNOSIS — I351 Nonrheumatic aortic (valve) insufficiency: Secondary | ICD-10-CM | POA: Diagnosis not present

## 2015-06-26 DIAGNOSIS — Z8679 Personal history of other diseases of the circulatory system: Secondary | ICD-10-CM

## 2015-06-26 NOTE — Progress Notes (Signed)
Patient ID: Yvonne [name], female   DOB: 1925/09/22, 81 y.o.   MRN: [id number]     Cardiology Office Note   Date:  06/26/2015   ID:  Yvonne [name], DOB 12-23-1925, MRN [id number]  PCP:  Yvonne Amel, MD  Cardiologist:   Sanda Klein, MD   Chief Complaint  Patient presents with  . Follow-up    7 month      History of Present Illness: Yvonne [name] is a 80 y.o. female who presents for  History of aortic aneurysm status post rupture requiring emergency repair in 2011 (today is actually the exact fifth anniversary of her surgery) , paroxysmal atrial tachycardia status post ablation for AVNRT , persistent symptomatic PACs treated with second night, history of second-degree AV block and symptomatic bradycardia on beta blocker therapy , systemic hypertension , mitral valve prolapse,  Insufficiency (resuspended the native aortic valve ) , hypothyroidism.   she feels well. Her weight is stable. She remains slightly underweight with a BMI around 19.  Echocardiogram performed in June showed normal left ventricular systolic function and only mild aortic insufficiency. She has a CT angiogram of the aorta and follow-up scheduled with Dr. Servando Snare on November 16. Her blood pressure is normal, usually in the range of 130/60 when she checks it at home.  Past Medical History  Diagnosis Date  . Thoracic aortic aneurysm ([place]) 05/2010    Ruptured ascending  . History of atrial paroxysmal tachycardia   . History of hypothyroidism   . Compression fracture of L2 lumbar vertebra (HCC)   . Acute blood loss anemia   . Volume overload   . Hypertension   . Mitral valve prolapse   . Hypothyroidism   . Afib ([place])     -no   . History of blood transfusion   . Shortness of breath     with  exertion  . UTI (lower urinary tract infection)   . GERD (gastroesophageal reflux disease)   . Arthritis   . Pericardial tamponade 05/2010  . Premature atrial contractions   . Syncope   . Heart murmur   .  Pneumonia   . Moderate aortic insufficiency 05/25/2014    Past Surgical History  Procedure Laterality Date  . Revision of left total hip replacement using some allograft  11/23/2010  . Classic atrioventricular nodal reentrant  10/20/2010  . L2 kyphoplasty using kyphon system.  03/17/2009  . Replacement of ascending aorta and hemiarch and resuspension of the aortic valve with circulatory cardiopulmonary bypass and circulatory arrest  06/25/2010    Dr Servando Snare  . Ablation    . Tubal ligation  1966  . Tonsillectomy    . Appendectomy    . Hernia repair  1993    Right Inguinal  . Eye surgery  2003    Bil  . Toe osteotomy      Right 2nd   . Percutaneous pinning wrist fracture  1995    bil  . Breast surgery      right biospy  . Kyphoplasty  09/05/2012    Procedure: KYPHOPLASTY;  Surgeon: Winfield Cunas, MD;  Location: [place] [facility];  Service: Neurosurgery;  Laterality: N/A;  L1 kyphoplasty  . Colonoscopy    . Kyphoplasty N/A 04/01/2014    Procedure: KYPHOPLASTY;  Surgeon: Ashok Pall, MD;  Location: [place] [facility];  Service: Neurosurgery;  Laterality: N/A;  Lumbar Four Kyphoplasty     Current Outpatient Prescriptions  Medication Sig Dispense Refill  . amLODipine (NORVASC) 5 MG tablet  Take 1 tablet (5 mg total) by mouth daily. NEED OV. 90 tablet 0  . aspirin 325 MG tablet Take 325 mg by mouth daily.    . calcium-vitamin D (OSCAL WITH D) 500-200 MG-UNIT per tablet Take 1 tablet by mouth daily at 12 noon.    . cholecalciferol (VITAMIN D) 1000 UNITS tablet Take 2,000 Units by mouth daily at 12 noon.     . flecainide (TAMBOCOR) 50 MG tablet Take one-half tablet by  mouth two times daily 90 tablet 2  . levothyroxine (SYNTHROID, LEVOTHROID) 50 MCG tablet Take 50 mcg by mouth every morning.     Marland Kitchen losartan (COZAAR) 50 MG tablet 1 tablet in the morning and 1/2 tablet in the evening 135 tablet 1  . traMADol (ULTRAM) 50 MG tablet Take 50 mg by mouth as needed.      No current facility-administered  medications for this visit.    Allergies:   Review of patient's allergies indicates no known allergies.    Social History:  The patient  reports that she has never smoked. She has never used smokeless tobacco. She reports that she does not drink alcohol or use illicit drugs.   Family History:  The patient's family history includes Heart failure in her mother; Hypertension in her brother, brother, sister, and sister; Stroke in her father and sister.    ROS:  Please see the history of present illness.    Otherwise, review of systems positive for none.   All other systems are reviewed and negative.    PHYSICAL EXAM: VS:  BP 134/58 mmHg  Pulse 66  Resp 16  Ht 5\' 6"  (1.676 m)  Wt 119 lb 9.6 oz (54.25 kg)  BMI 19.31 kg/m2 , BMI Body mass index is 19.31 kg/(m^2).  General: Alert, oriented x3, no distress, very thin Head: no evidence of trauma, PERRL, EOMI, no exophtalmos or lid lag, no myxedema, no xanthelasma; normal ears, nose and oropharynx Neck: normal jugular venous pulsations and no hepatojugular reflux; brisk carotid pulses without delay and no carotid bruits Chest: clear to auscultation, no signs of consolidation by percussion or palpation, normal fremitus, symmetrical and full respiratory excursions, sternotomy scar Cardiovascular: normal position and quality of the apical impulse, regular rhythm, normal first and second heart sounds, 3/6 decrescendo diastolic aortic murmur - sounds louder than last year, midsystolic click, no systolic murmur at the apex, no rubs or gallops Abdomen: no tenderness or distention, no masses by palpation, no abnormal pulsatility or arterial bruits, normal bowel sounds, no hepatosplenomegaly Extremities: no clubbing, cyanosis or edema; 2+ radial, ulnar and brachial pulses bilaterally; 2+ right femoral, posterior tibial and dorsalis pedis pulses; 2+ left femoral, posterior tibial and dorsalis pedis pulses; no subclavian or femoral bruits Neurological:  grossly nonfocal Psych: euthymic mood, full affect   EKG:  EKG is ordered today. The ekg ordered today demonstrates  Normal sinus rhythm , first-degree AV block, left ventricular hypertrophy , no change from previous tracing   Recent Labs: 06/30/2014: BUN 17; Creat 0.60    Lipid Panel No results found for: CHOL, TRIG, HDL, CHOLHDL, VLDL, LDLCALC, LDLDIRECT    Wt Readings from Last 3 Encounters:  06/26/15 119 lb 9.6 oz (54.25 kg)  11/21/14 120 lb 9.6 oz (54.704 kg)  07/03/14 121 lb (54.885 kg)    .   ASSESSMENT AND PLAN:  1. HTN - now close to target SBP< 130. Had swelling of the legs with higher dose amlodipine. Intolerant to beta blockers due to 2:1 AV  block.   2. Aortic insufficiency - recheck echo  Actually showed lower estimation of AI severity  3. S/p Ascending aorta aneurysm repair - CTA and appt with Dr. Servando Snare in November.  Labs including creatinine before that.  4. PAT/PACs - excellent symptom control with flecainide.    Current medicines are reviewed at length with the patient today.  The patient does not have concerns regarding medicines.  The following changes have been made:  no change  Labs/ tests ordered today include:  Orders Placed This Encounter  Procedures  . TSH  . Comprehensive metabolic panel  . EKG 12-Lead    Patient Instructions  Your physician recommends that you return for lab work - CMET & TSH  Your physician wants you to follow-up in: 6 months with Dr. Sallyanne Kuster. You will receive a reminder letter in the mail two months in advance. If you don't receive a letter, please call our office to schedule the follow-up appointment.       Mikael Spray, MD  06/26/2015 10:11 AM    Sanda Klein, MD, [facility] [phone] office [phone] pager

## 2015-06-26 NOTE — Patient Instructions (Signed)
Your physician recommends that you return for lab work - CMET & TSH  Your physician wants you to follow-up in: 6 months with Dr. Sallyanne Kuster. You will receive a reminder letter in the mail two months in advance. If you don't receive a letter, please call our office to schedule the follow-up appointment.

## 2015-07-10 ENCOUNTER — Telehealth: Payer: Self-pay | Admitting: Cardiovascular Disease

## 2015-07-10 ENCOUNTER — Other Ambulatory Visit: Payer: Self-pay | Admitting: Cardiovascular Disease

## 2015-07-10 LAB — COMPREHENSIVE METABOLIC PANEL
ALBUMIN: 4.1 g/dL (ref 3.6–5.1)
ALT: 6 U/L (ref 6–29)
AST: 17 U/L (ref 10–35)
Alkaline Phosphatase: 72 U/L (ref 33–130)
BUN: 23 mg/dL (ref 7–25)
CO2: 25 mmol/L (ref 20–31)
CREATININE: 0.96 mg/dL — AB (ref 0.60–0.88)
Calcium: 9.2 mg/dL (ref 8.6–10.4)
Chloride: 101 mmol/L (ref 98–110)
Glucose, Bld: 97 mg/dL (ref 65–99)
POTASSIUM: 4.4 mmol/L (ref 3.5–5.3)
SODIUM: 137 mmol/L (ref 135–146)
Total Bilirubin: 1.4 mg/dL — ABNORMAL HIGH (ref 0.2–1.2)
Total Protein: 7 g/dL (ref 6.1–8.1)

## 2015-07-10 MED ORDER — AMOXICILLIN 500 MG PO TABS: ORAL_TABLET | ORAL | Status: DC

## 2015-07-10 NOTE — Telephone Encounter (Signed)
Rx(s) sent to pharmacy electronically. LM that med was refilled.  

## 2015-07-10 NOTE — Telephone Encounter (Signed)
Spoke with patient. She states she has a tooth that is bothering her and she is scheduled to go to the dentist on Monday. She called her dentist to get pre-meds for this but they are closed on Friday. She states she usually gets amoxicillin 500mg  - take 4 capsules PO one hour to procedure.   This is not on her med list   Will defer to MD to OK  Patient would like #20 - cost her the same as 4 tablets

## 2015-07-10 NOTE — Telephone Encounter (Signed)
OK to Rx #20 Amoxicillin 500 mg

## 2015-07-10 NOTE — Telephone Encounter (Signed)
Please call,pt says she needs some Amoxicillin,but she needs to talk to you first.

## 2015-07-11 LAB — TSH: TSH: 1.758 u[IU]/mL (ref 0.350–4.500)

## 2015-07-13 ENCOUNTER — Other Ambulatory Visit: Payer: Self-pay | Admitting: Cardiothoracic Surgery

## 2015-07-13 DIAGNOSIS — I712 Thoracic aortic aneurysm, without rupture: Secondary | ICD-10-CM

## 2015-07-13 DIAGNOSIS — I7121 Aneurysm of the ascending aorta, without rupture: Secondary | ICD-10-CM

## 2015-07-15 ENCOUNTER — Ambulatory Visit
Admission: RE | Admit: 2015-07-15 | Discharge: 2015-07-15 | Disposition: A | Discharge status: Home / Self Care | Payer: Medicare Other | Source: Ambulatory Visit | Attending: Cardiothoracic Surgery | Admitting: Cardiothoracic Surgery

## 2015-07-15 ENCOUNTER — Ambulatory Visit: Payer: Medicare Other | Admitting: Cardiothoracic Surgery

## 2015-07-15 DIAGNOSIS — I712 Thoracic aortic aneurysm, without rupture, unspecified: Secondary | ICD-10-CM

## 2015-07-15 MED ORDER — IOPAMIDOL (ISOVUE-370) INJECTION 76%
75.0000 mL | Freq: Once | INTRAVENOUS | Status: AC | PRN
  Administered 2015-07-15: 75 mL via INTRAVENOUS

## 2015-07-22 ENCOUNTER — Encounter: Payer: Self-pay | Admitting: Cardiothoracic Surgery

## 2015-07-22 ENCOUNTER — Ambulatory Visit (INDEPENDENT_AMBULATORY_CARE_PROVIDER_SITE_OTHER): Payer: Medicare Other | Admitting: Cardiothoracic Surgery

## 2015-07-22 VITALS — BP 119/64 | HR 77 | Resp 20 | Ht 65.0 in | Wt 119.0 lb

## 2015-07-22 DIAGNOSIS — I711 Thoracic aortic aneurysm, ruptured, unspecified: Secondary | ICD-10-CM

## 2015-07-22 DIAGNOSIS — I351 Nonrheumatic aortic (valve) insufficiency: Secondary | ICD-10-CM

## 2015-07-22 NOTE — Progress Notes (Signed)
[address]       [place] [address]             [phone]          Yvonne [name]  Medical Record [id number] Date of Birth: [birth year]  Referring: Sanda Klein, MD Primary Care: Lujean Amel, MD  Chief Complaint:    Chief Complaint  Patient presents with  . Follow-up    1 year f/u with CTA Chest    History of Present Illness:    Patient returns today for followup CT scan of the chest after having repair of a ruptured ascending aortic aneurysm and dissection and resuspension of aortic valve done as an emergency 06/25/2010. Now 5 years postop she continues to be ambulatory and caring for herself. She has no overt symptoms of congestive heart failure. Denies pedal edema   Current Activity/ Functional Status: Walks with a cane, but otherwise is mobile   Past Medical History  Diagnosis Date  . Thoracic aortic aneurysm ([place]) 05/2010    Ruptured ascending  . History of atrial paroxysmal tachycardia   . History of hypothyroidism   . Compression fracture of L2 lumbar vertebra (HCC)   . Acute blood loss anemia   . Volume overload   . Hypertension   . Mitral valve prolapse   . Hypothyroidism   . Afib ([place])     -no   . History of blood transfusion   . Shortness of breath     with  exertion  . UTI (lower urinary tract infection)   . GERD (gastroesophageal reflux disease)   . Arthritis   . Pericardial tamponade 05/2010  . Premature atrial contractions   . Syncope   . Heart murmur   . Pneumonia   . Moderate aortic insufficiency 05/25/2014    Past Surgical History  Procedure Laterality Date  . Revision of left total hip replacement using some allograft  11/23/2010  . Classic atrioventricular nodal reentrant  10/20/2010  . L2 kyphoplasty using kyphon system.  03/17/2009  . Replacement of ascending aorta and hemiarch and resuspension of the aortic valve with circulatory cardiopulmonary bypass and circulatory arrest  06/25/2010    Dr Servando Snare  . Ablation    . Tubal ligation  1966  . Tonsillectomy    . Appendectomy    . Hernia repair  1993    Right Inguinal  . Eye surgery  2003    Bil  . Toe osteotomy      Right 2nd   . Percutaneous pinning wrist fracture  1995    bil  . Breast surgery      right biospy  . Kyphoplasty  09/05/2012    Procedure: KYPHOPLASTY;  Surgeon: Winfield Cunas, MD;  Location: [place] [facility];  Service: Neurosurgery;  Laterality: N/A;  L1 kyphoplasty  . Colonoscopy    . Kyphoplasty N/A 04/01/2014    Procedure: KYPHOPLASTY;  Surgeon: Ashok Pall, MD;  Location: [place] [facility];  Service: Neurosurgery;  Laterality: N/A;  Lumbar Four Kyphoplasty    Family History  Problem Relation Age of Onset  . Heart failure Mother   . Stroke Father   . Hypertension Brother   . Hypertension Brother   . Hypertension Sister   . Hypertension Sister   . Stroke Sister     Social History   Social History  . Marital Status: Widowed    Spouse Name: N/A  . Number of Children: N/A  . Years of Education:  N/A   Occupational History  . Not on file.   Social History Main Topics  . Smoking status: Never Smoker   . Smokeless tobacco: Never Used  . Alcohol Use: No  . Drug Use: No  . Sexual Activity: Not on file   Other Topics Concern  . Not on file   Social History Narrative    History  Smoking status  . Never Smoker   Smokeless tobacco  . Never Used    History  Alcohol Use No     No Known Allergies  Current Outpatient Prescriptions  Medication Sig Dispense Refill  . amLODipine (NORVASC) 5 MG tablet Take 1 tablet (5 mg total) by mouth daily. NEED OV. 90 tablet 0  . amoxicillin (AMOXIL) 500 MG tablet Take 4 tablets (2gram) by mouth one hour prior to dental work. 20 tablet 0  . aspirin 325 MG tablet Take 325 mg by mouth daily.    . calcium-vitamin D (OSCAL WITH D) 500-200 MG-UNIT per tablet Take 1 tablet by mouth daily at 12 noon.    . cholecalciferol (VITAMIN D) 1000 UNITS tablet Take 2,000  Units by mouth daily at 12 noon.     . flecainide (TAMBOCOR) 50 MG tablet Take one-half tablet by  mouth two times daily 90 tablet 2  . levothyroxine (SYNTHROID, LEVOTHROID) 50 MCG tablet Take 50 mcg by mouth every morning.     Marland Kitchen losartan (COZAAR) 50 MG tablet 1 tablet in the morning and 1/2 tablet in the evening 135 tablet 1  . traMADol (ULTRAM) 50 MG tablet Take 50 mg by mouth as needed.      No current facility-administered medications for this visit.       Physical Exam: BP 119/64 mmHg  Pulse 77  Resp 20  Ht 5\' 5"  (1.651 m)  Wt 119 lb (53.978 kg)  BMI 19.80 kg/m2  SpO2 97%  General appearance: alert, cooperative and cachectic Neurologic: intact Heart: regular rate and rhythm and diastolic murmur: mid diastolic 2/6, decrescendo at 2nd left intercostal space Lungs: clear to auscultation bilaterally and normal percussion bilaterally Abdomen: soft, non-tender; bowel sounds normal; no masses,  no organomegaly Extremities: extremities normal, atraumatic, no cyanosis or edema, Homans sign is negative, no sign of DVT and Patient has pain in the left hip from recent fall on previously replaced Wound: Sternal incision is stable and well healed Murmur sounds unchanged from previous description  Diagnostic Studies & Laboratory data:     Recent Radiology Findings:  Ct Angio Chest Aorta W/cm &/or Wo/cm  07/15/2015  CLINICAL DATA:  Thoracic aortic aneurysm without rupture. EXAM: CT ANGIOGRAPHY CHEST WITH CONTRAST TECHNIQUE: Multidetector CT imaging of the chest was performed using the standard protocol during bolus administration of intravenous contrast. Multiplanar CT image reconstructions and MIPs were obtained to evaluate the vascular anatomy. CONTRAST:  75 mL of Isovue 370 intravenously. COMPARISON:  CT scan of July 03, 2014. FINDINGS: Status post aortic graft placement extending from aortic valve to proximal aortic arch. The proximal ascending thoracic aorta measures 4.5 cm in maximum  diameter which is stable compared to prior exam. The proximal portion of the transverse aortic arch measures 5.5 cm which is stable compared to prior exam. The proximal portion of the descending thoracic aorta measures 3.2 cm which is within normal limits. No dissection is noted. Great vessels are widely patent without stenosis. No pneumothorax or significant pleural effusion is noted. Stable biapical scarring is noted. No acute pulmonary disease is noted. No significant abnormality  is noted in visualized portion of upper abdomen. No evidence of mediastinal mass or adenopathy is noted. Stable lower thoracic old compression fracture is noted. Review of the MIP images confirms the above findings. IMPRESSION: Status post aortic graft placement for treatment of thoracic aortic aneurysm. There is stable dilatation of the ascending thoracic aorta and proximal aortic arch compared to prior exam. No dissection or significant changes noted compared to prior exam. Electronically Signed   By: Marijo Conception, M.D.   On: 07/15/2015 13:49   Echo from June 2016 is reviewed, mild aortic insufficiency and mild mitral insufficiency   Recent Lab Findings: Lab Results  Component Value Date   WBC 5.9 04/01/2014   HGB 12.0 04/01/2014   HCT 37.7 04/01/2014   PLT 173 04/01/2014   GLUCOSE 97 07/10/2015   ALT 6 07/10/2015   AST 17 07/10/2015   NA 137 07/10/2015   K 4.4 07/10/2015   CL 101 07/10/2015   CREATININE 0.96* 07/10/2015   BUN 23 07/10/2015   CO2 25 07/10/2015   TSH 1.758 07/10/2015   INR 0.92 11/17/2010      Assessment / Plan:    Patient returns to the office with a followup CTA of the chest now 5 years and just past her 68 th birthday after emergency replacement of her acscending aorta resuspension of her aortic valve. She continues to do well postoperatively. CT scan shows proximally  dilated arch about 5.3 cm in size. With the patient's age and fragility operative intervention is not recommended.  Aortic arch replacement is discussed with the patient but she is not interested in any intervention. We'll plan to see her back with a followup CTA of the chest in one year.    Grace Isaac MD  Beeper [phone] Office [phone] 07/22/2015 1:25 PM

## 2015-07-29 ENCOUNTER — Other Ambulatory Visit: Payer: Self-pay | Admitting: Cardiovascular Disease

## 2015-07-29 NOTE — Telephone Encounter (Signed)
Rx request sent to pharmacy.  

## 2015-08-05 ENCOUNTER — Other Ambulatory Visit: Payer: Self-pay | Admitting: Neurosurgery

## 2015-08-06 ENCOUNTER — Encounter (HOSPITAL_COMMUNITY): Payer: Self-pay | Admitting: *Deleted

## 2015-08-06 MED ORDER — CEFAZOLIN SODIUM-DEXTROSE 2-3 GM-% IV SOLR
2.0000 g | INTRAVENOUS | Status: AC
  Administered 2015-08-07: 2 g via INTRAVENOUS

## 2015-08-06 NOTE — Progress Notes (Signed)
Anesthesia Chart Review:  Pt is 81 year old female scheduled for L3 kyphoplasty on 08/07/2015 with Dr. Christella Noa.   Pt is same day work up.   PMH includes:  Thoracic aortic aneurysm (s/p rupture requiring emergency replacement of ascending aorta and hemiarch with resuspension of AV 06/25/10 with distal anastomosis aneurysm measuring 5.5 cm without recurrent dissection by CTA 07/15/15 (according to 07/22/15 note by Dr. Servando Snare, he felt due to patient's age and fragility did not recommend operative intervention nor was she interested and CTA in 1 year recommended; this is stable going back to 06/27/13 note), paroxysmal atrial tachycardia (s/p ablation for AVNRT), persistent PACs (controlled with flecainide), 2nd degree AV block and symptomatic bradycardia, HTN, MVP, aortic insufficiency, hypothyroidism. Never smoker. BMI 20.  S/p kyphoplasty 04/01/14 and 09/05/12.   Medications include: amlodipine, ASA, flecainide, levothyroxine, losartan.   Pt will need labs DOS.   EKG 06/26/15: NSR. 1st degree AV block. LVH.   Echo 02/06/15:  - Technical notes: Technically difficult echo due to VERY small body habitus, narrow rib spacing, significant scar tissue buildup, and lung shadowing. - Left ventricle: The cavity size was normal. There was mild focalbasal hypertrophy of the septum. Systolic function was normal.The estimated ejection fraction was in the range of 60% to 65%.Wall motion was normal; there were no regional wall motionabnormalities. The study is not technically sufficient to allowevaluation of LV diastolic function. - Aortic valve: Mildly calcified leaflets. Transvalvular velocity was minimally increased. There was mild regurgitation. Valve area(VTI): 3.37 cm^2. Valve area (Vmax): 3.45 cm^2. Valve area(Vmean): 3.68 cm^2. - Aorta: Not visualized. - Mitral valve: Moderately thickened leaflets . There was trivial regurgitation. - Left atrium: The atrium was mildly dilated. - Right atrium: The  atrium was mildly dilated. - Tricuspid valve: There was mild regurgitation. - Pulmonary arteries: PA peak pressure: 29 mm Hg (S). - Inferior vena cava: The vessel was normal in size. The respirophasic diameter changes were in the normal range (= 50%),consistent with normal central venous pressure.  Carotid duplex 02/19/14: Bilateral bulb/proximal ICAs demonstrated a mild amount of fibrous plaque with no evidence of significant diameter reduction, tortuosity, or other vascular abnormality. Diminished diastolic flow visualized of the bilateral CCA/ICA.  Nuclear stress test 05/04/10: Normal myocardial imaging, post-stress EF 72%, no significant wall motion abnormalities.  CT surgery did not recommend intervention of her TAA following her 07/15/15 CTA. Tolerated same procedure 03/2014 without issue.  There does not appear to have been any interval changes between last summer and now that would prohibit proceeding with surgery. She will be further evaluated on the day of surgery, but if no acute changes then it is anticipated that she can proceed as planned.   Willeen Cass, FNP-BC [facility] Stay [facility]/Anesthesiology Phone: [phone] 08/06/2015 4:16 PM

## 2015-08-07 ENCOUNTER — Ambulatory Visit (HOSPITAL_COMMUNITY): Payer: Medicare Other | Admitting: Emergency Medicine

## 2015-08-07 ENCOUNTER — Inpatient Hospital Stay (HOSPITAL_COMMUNITY)
Admission: AD | Admit: 2015-08-07 | Discharge: 2015-08-07 | DRG: 544 | Disposition: A | Discharge status: Home / Self Care | Payer: Medicare Other | Source: Ambulatory Visit | Attending: Neurosurgery | Admitting: Neurosurgery

## 2015-08-07 ENCOUNTER — Ambulatory Visit (HOSPITAL_COMMUNITY): Payer: Medicare Other

## 2015-08-07 ENCOUNTER — Encounter (HOSPITAL_COMMUNITY): Payer: Self-pay | Admitting: *Deleted

## 2015-08-07 ENCOUNTER — Encounter (HOSPITAL_COMMUNITY)
Admission: AD | Disposition: A | Discharge status: Home / Self Care | Payer: Self-pay | Source: Ambulatory Visit | Attending: Neurosurgery

## 2015-08-07 DIAGNOSIS — I10 Essential (primary) hypertension: Secondary | ICD-10-CM | POA: Diagnosis present

## 2015-08-07 DIAGNOSIS — E039 Hypothyroidism, unspecified: Secondary | ICD-10-CM | POA: Diagnosis present

## 2015-08-07 DIAGNOSIS — Z9841 Cataract extraction status, right eye: Secondary | ICD-10-CM | POA: Diagnosis not present

## 2015-08-07 DIAGNOSIS — Z9842 Cataract extraction status, left eye: Secondary | ICD-10-CM | POA: Diagnosis not present

## 2015-08-07 DIAGNOSIS — I341 Nonrheumatic mitral (valve) prolapse: Secondary | ICD-10-CM | POA: Diagnosis present

## 2015-08-07 DIAGNOSIS — S22000A Wedge compression fracture of unspecified thoracic vertebra, initial encounter for closed fracture: Secondary | ICD-10-CM | POA: Diagnosis present

## 2015-08-07 DIAGNOSIS — Z955 Presence of coronary angioplasty implant and graft: Secondary | ICD-10-CM | POA: Diagnosis not present

## 2015-08-07 DIAGNOSIS — I739 Peripheral vascular disease, unspecified: Secondary | ICD-10-CM | POA: Diagnosis present

## 2015-08-07 DIAGNOSIS — I4891 Unspecified atrial fibrillation: Secondary | ICD-10-CM | POA: Diagnosis present

## 2015-08-07 DIAGNOSIS — Z85828 Personal history of other malignant neoplasm of skin: Secondary | ICD-10-CM | POA: Diagnosis not present

## 2015-08-07 DIAGNOSIS — Z7982 Long term (current) use of aspirin: Secondary | ICD-10-CM

## 2015-08-07 DIAGNOSIS — Z79899 Other long term (current) drug therapy: Secondary | ICD-10-CM

## 2015-08-07 DIAGNOSIS — M4856XA Collapsed vertebra, not elsewhere classified, lumbar region, initial encounter for fracture: Secondary | ICD-10-CM | POA: Diagnosis present

## 2015-08-07 DIAGNOSIS — I351 Nonrheumatic aortic (valve) insufficiency: Secondary | ICD-10-CM | POA: Diagnosis present

## 2015-08-07 DIAGNOSIS — Z419 Encounter for procedure for purposes other than remedying health state, unspecified: Secondary | ICD-10-CM

## 2015-08-07 DIAGNOSIS — Z96642 Presence of left artificial hip joint: Secondary | ICD-10-CM | POA: Diagnosis present

## 2015-08-07 DIAGNOSIS — M1990 Unspecified osteoarthritis, unspecified site: Secondary | ICD-10-CM | POA: Diagnosis present

## 2015-08-07 DIAGNOSIS — I440 Atrioventricular block, first degree: Secondary | ICD-10-CM | POA: Diagnosis present

## 2015-08-07 DIAGNOSIS — K219 Gastro-esophageal reflux disease without esophagitis: Secondary | ICD-10-CM | POA: Diagnosis present

## 2015-08-07 DIAGNOSIS — M545 Low back pain: Secondary | ICD-10-CM | POA: Diagnosis present

## 2015-08-07 HISTORY — PX: KYPHOPLASTY: SHX5884

## 2015-08-07 HISTORY — DX: Malignant (primary) neoplasm, unspecified: C80.1

## 2015-08-07 LAB — BASIC METABOLIC PANEL
ANION GAP: 10 (ref 5–15)
BUN: 21 mg/dL — ABNORMAL HIGH (ref 6–20)
CALCIUM: 9.5 mg/dL (ref 8.9–10.3)
CO2: 23 mmol/L (ref 22–32)
CREATININE: 1.06 mg/dL — AB (ref 0.44–1.00)
Chloride: 103 mmol/L (ref 101–111)
GFR, EST AFRICAN AMERICAN: 53 mL/min — AB (ref >60)
GFR, EST NON AFRICAN AMERICAN: 46 mL/min — AB (ref >60)
GLUCOSE: 89 mg/dL (ref 65–99)
Potassium: 4.8 mmol/L (ref 3.5–5.1)
Sodium: 136 mmol/L (ref 135–145)

## 2015-08-07 LAB — CBC
HCT: 43.1 % (ref 36.0–46.0)
Hemoglobin: 13.7 g/dL (ref 12.0–15.0)
MCH: 30.2 pg (ref 26.0–34.0)
MCHC: 31.8 g/dL (ref 30.0–36.0)
MCV: 95.1 fL (ref 78.0–100.0)
PLATELETS: 161 10*3/uL (ref 150–400)
RBC: 4.53 MIL/uL (ref 3.87–5.11)
RDW: 13.8 % (ref 11.5–15.5)
WBC: 5.3 10*3/uL (ref 4.0–10.5)

## 2015-08-07 SURGERY — KYPHOPLASTY
Anesthesia: General

## 2015-08-07 MED ORDER — LOSARTAN POTASSIUM 50 MG PO TABS: 25.0000 mg | ORAL_TABLET | Freq: Every day | ORAL | Status: DC

## 2015-08-07 MED ORDER — CALCIUM CARBONATE-VITAMIN D 500-200 MG-UNIT PO TABS: 1.0000 | ORAL_TABLET | Freq: Every day | ORAL | Status: DC

## 2015-08-07 MED ORDER — POTASSIUM CHLORIDE IN NACL 20-0.9 MEQ/L-% IV SOLN
INTRAVENOUS | Status: DC
Start: 2015-08-07 — End: 2015-08-07
  Filled 2015-08-07 (×2): qty 1000

## 2015-08-07 MED ORDER — ROCURONIUM BROMIDE 100 MG/10ML IV SOLN
INTRAVENOUS | Status: DC | PRN
  Administered 2015-08-07: 40 mg via INTRAVENOUS

## 2015-08-07 MED ORDER — LACTATED RINGERS IV SOLN
INTRAVENOUS | Status: DC
  Administered 2015-08-07: 10:00:00 via INTRAVENOUS

## 2015-08-07 MED ORDER — VITAMIN D 1000 UNITS PO TABS: 2000.0000 [IU] | ORAL_TABLET | Freq: Every day | ORAL | Status: DC

## 2015-08-07 MED ORDER — FENTANYL CITRATE (PF) 100 MCG/2ML IJ SOLN
INTRAMUSCULAR | Status: DC | PRN
Start: 2015-08-07 — End: 2015-08-07
  Administered 2015-08-07: 100 ug via INTRAVENOUS

## 2015-08-07 MED ORDER — FENTANYL CITRATE (PF) 250 MCG/5ML IJ SOLN
INTRAMUSCULAR | Status: AC
  Filled 2015-08-07: qty 5

## 2015-08-07 MED ORDER — LIDOCAINE-EPINEPHRINE 0.5 %-1:200000 IJ SOLN
INTRAMUSCULAR | Status: DC | PRN
  Administered 2015-08-07: 7 mL

## 2015-08-07 MED ORDER — PROPOFOL 10 MG/ML IV BOLUS
INTRAVENOUS | Status: AC
  Filled 2015-08-07: qty 20

## 2015-08-07 MED ORDER — CEFAZOLIN SODIUM-DEXTROSE 2-3 GM-% IV SOLR
INTRAVENOUS | Status: AC
  Filled 2015-08-07: qty 50

## 2015-08-07 MED ORDER — NEOSTIGMINE METHYLSULFATE 10 MG/10ML IV SOLN
INTRAVENOUS | Status: DC | PRN
  Administered 2015-08-07: 3 mg via INTRAVENOUS

## 2015-08-07 MED ORDER — ONDANSETRON HCL 4 MG/2ML IJ SOLN
INTRAMUSCULAR | Status: DC | PRN
  Administered 2015-08-07: 4 mg via INTRAVENOUS

## 2015-08-07 MED ORDER — GLYCOPYRROLATE 0.2 MG/ML IJ SOLN
INTRAMUSCULAR | Status: DC | PRN
  Administered 2015-08-07: 0.4 mg via INTRAVENOUS

## 2015-08-07 MED ORDER — LOSARTAN POTASSIUM 50 MG PO TABS: 25.0000 mg | ORAL_TABLET | Freq: Two times a day (BID) | ORAL | Status: DC

## 2015-08-07 MED ORDER — HYDROMORPHONE HCL 1 MG/ML IJ SOLN
INTRAMUSCULAR | Status: AC
  Filled 2015-08-07: qty 1

## 2015-08-07 MED ORDER — PHENYLEPHRINE HCL 10 MG/ML IJ SOLN
10.0000 mg | INTRAVENOUS | Status: DC | PRN
  Administered 2015-08-07: 30 ug/min via INTRAVENOUS

## 2015-08-07 MED ORDER — PROMETHAZINE HCL 25 MG/ML IJ SOLN: 6.2500 mg | INTRAMUSCULAR | Status: DC | PRN

## 2015-08-07 MED ORDER — IOHEXOL 300 MG/ML  SOLN
INTRAMUSCULAR | Status: DC | PRN
  Administered 2015-08-07: 300 mL via INTRAVENOUS

## 2015-08-07 MED ORDER — GLYCOPYRROLATE 0.2 MG/ML IJ SOLN
INTRAMUSCULAR | Status: AC
  Filled 2015-08-07: qty 3

## 2015-08-07 MED ORDER — TRAMADOL HCL 50 MG PO TABS
50.0000 mg | ORAL_TABLET | Freq: Four times a day (QID) | ORAL | Status: DC
  Administered 2015-08-07: 50 mg via ORAL
  Filled 2015-08-07: qty 1

## 2015-08-07 MED ORDER — HYDROMORPHONE HCL 1 MG/ML IJ SOLN
0.2500 mg | INTRAMUSCULAR | Status: DC | PRN
  Administered 2015-08-07: 0.25 mg via INTRAVENOUS

## 2015-08-07 MED ORDER — GLYCOPYRROLATE 0.2 MG/ML IJ SOLN
INTRAMUSCULAR | Status: AC
  Filled 2015-08-07: qty 2

## 2015-08-07 MED ORDER — PROPOFOL 10 MG/ML IV BOLUS
INTRAVENOUS | Status: DC | PRN
  Administered 2015-08-07: 80 mg via INTRAVENOUS

## 2015-08-07 MED ORDER — LEVOTHYROXINE SODIUM 100 MCG PO TABS: 50.0000 ug | ORAL_TABLET | Freq: Every day | ORAL | Status: DC

## 2015-08-07 MED ORDER — LOSARTAN POTASSIUM 50 MG PO TABS: 50.0000 mg | ORAL_TABLET | Freq: Every day | ORAL | Status: DC

## 2015-08-07 MED ORDER — 0.9 % SODIUM CHLORIDE (POUR BTL) OPTIME
TOPICAL | Status: DC | PRN
  Administered 2015-08-07: 1000 mL

## 2015-08-07 MED ORDER — EPHEDRINE SULFATE 50 MG/ML IJ SOLN
INTRAMUSCULAR | Status: DC | PRN
  Administered 2015-08-07: 5 mg via INTRAVENOUS

## 2015-08-07 MED ORDER — PHENYLEPHRINE HCL 10 MG/ML IJ SOLN
INTRAMUSCULAR | Status: AC
  Filled 2015-08-07: qty 1

## 2015-08-07 MED ORDER — ONDANSETRON HCL 4 MG/2ML IJ SOLN
INTRAMUSCULAR | Status: AC
  Filled 2015-08-07: qty 6

## 2015-08-07 MED ORDER — FLECAINIDE ACETATE 50 MG PO TABS: 50.0000 mg | ORAL_TABLET | Freq: Once | ORAL | Status: DC

## 2015-08-07 MED ORDER — LIDOCAINE HCL (CARDIAC) 20 MG/ML IV SOLN
INTRAVENOUS | Status: DC | PRN
  Administered 2015-08-07: 60 mg via INTRAVENOUS

## 2015-08-07 SURGICAL SUPPLY — 38 items
BANDAGE ADH SHEER 1  50/CT (GAUZE/BANDAGES/DRESSINGS) ×2 IMPLANT
BLADE CLIPPER SURG (BLADE) IMPLANT
BLADE SURG 15 STRL LF DISP TIS (BLADE) ×1 IMPLANT
BLADE SURG 15 STRL SS (BLADE) ×3
CEMENT KYPHON C01A KIT/MIXER (Cement) ×2 IMPLANT
DRAPE C-ARM 42X72 X-RAY (DRAPES) ×3 IMPLANT
DRAPE LAPAROTOMY 100X72X124 (DRAPES) ×3 IMPLANT
DRAPE PROXIMA HALF (DRAPES) ×3 IMPLANT
DRAPE SURG 17X23 STRL (DRAPES) ×3 IMPLANT
DURAPREP 26ML APPLICATOR (WOUND CARE) ×3 IMPLANT
GAUZE SPONGE 4X4 16PLY XRAY LF (GAUZE/BANDAGES/DRESSINGS) ×3 IMPLANT
GLOVE ECLIPSE 6.5 STRL STRAW (GLOVE) ×3 IMPLANT
GLOVE ECLIPSE 7.5 STRL STRAW (GLOVE) ×4 IMPLANT
GLOVE EXAM NITRILE LRG STRL (GLOVE) IMPLANT
GLOVE EXAM NITRILE MD LF STRL (GLOVE) IMPLANT
GLOVE EXAM NITRILE XL STR (GLOVE) IMPLANT
GLOVE EXAM NITRILE XS STR PU (GLOVE) IMPLANT
GLOVE INDICATOR 8.0 STRL GRN (GLOVE) ×2 IMPLANT
GOWN STRL REUS W/ TWL LRG LVL3 (GOWN DISPOSABLE) ×2 IMPLANT
GOWN STRL REUS W/ TWL XL LVL3 (GOWN DISPOSABLE) IMPLANT
GOWN STRL REUS W/TWL 2XL LVL3 (GOWN DISPOSABLE) IMPLANT
GOWN STRL REUS W/TWL LRG LVL3 (GOWN DISPOSABLE) ×6
GOWN STRL REUS W/TWL XL LVL3 (GOWN DISPOSABLE)
KIT BASIN OR (CUSTOM PROCEDURE TRAY) ×3 IMPLANT
KIT ROOM TURNOVER OR (KITS) ×3 IMPLANT
LIQUID BAND (GAUZE/BANDAGES/DRESSINGS) ×3 IMPLANT
NDL HYPO 25X1 1.5 SAFETY (NEEDLE) ×1 IMPLANT
NEEDLE HYPO 25X1 1.5 SAFETY (NEEDLE) ×3 IMPLANT
NS IRRIG 1000ML POUR BTL (IV SOLUTION) ×3 IMPLANT
PACK SURGICAL SETUP 50X90 (CUSTOM PROCEDURE TRAY) ×3 IMPLANT
PAD ARMBOARD 7.5X6 YLW CONV (MISCELLANEOUS) ×9 IMPLANT
SPECIMEN JAR SMALL (MISCELLANEOUS) IMPLANT
STAPLER SKIN PROX WIDE 3.9 (STAPLE) ×3 IMPLANT
SUT VIC AB 3-0 SH 8-18 (SUTURE) ×3 IMPLANT
SYR CONTROL 10ML LL (SYRINGE) ×6 IMPLANT
TOWEL OR 17X24 6PK STRL BLUE (TOWEL DISPOSABLE) ×3 IMPLANT
TOWEL OR 17X26 10 PK STRL BLUE (TOWEL DISPOSABLE) ×3 IMPLANT
TRAY KYPHOPAK 20/3 ADV 1ST (SET/KITS/TRAYS/PACK) ×2 IMPLANT

## 2015-08-07 NOTE — Discharge Summary (Signed)
  Physician Discharge Summary  Patient ID: Yvonne [name] MRN: [id number] DOB/AGE: Apr 25, 1926 81 y.o.  Admit date: 08/07/2015 Discharge date: 08/07/2015  Admission Diagnoses:L3 compression fracture  Discharge Diagnoses:  Active Problems:   Compression fracture of body of thoracic vertebra   Discharged Condition: good  Hospital Course: Yvonne [name] was admitted this morning and underwent an uncomplicated L3 kyphoplasty for a compression fracture. Post op she is voiding, ambulating, and tolerating a regular diet. Her wounds are clean, dry, and without signs of infection.   Treatments: surgery: L3 Kyphoplasty  Discharge Exam: Blood pressure 206/56, pulse 70, temperature 98.2 F (36.8 C), temperature source Oral, resp. rate 20, height 5\' 5"  (1.651 m), weight 53.978 kg (119 lb), SpO2 98 %. General appearance: alert, cooperative and appears stated age Neurologic: Grossly normal  Disposition: 03-Skilled Nursing Facility Lumbar three fracture    Medication List    TAKE these medications        amLODipine 5 MG tablet  Commonly known as:  NORVASC  Take 1 tablet by mouth  daily     amoxicillin 500 MG tablet  Commonly known as:  AMOXIL  Take 4 tablets (2gram) by mouth one hour prior to dental work.     aspirin 325 MG tablet  Take 325 mg by mouth daily.     calcium-vitamin D 500-200 MG-UNIT tablet  Commonly known as:  OSCAL WITH D  Take 1 tablet by mouth daily at 12 noon.     cholecalciferol 1000 UNITS tablet  Commonly known as:  VITAMIN D  Take 2,000 Units by mouth daily at 12 noon.     flecainide 50 MG tablet  Commonly known as:  TAMBOCOR  Take one-half tablet by  mouth two times daily     levothyroxine 50 MCG tablet  Commonly known as:  SYNTHROID, LEVOTHROID  Take 50 mcg by mouth every morning.     losartan 50 MG tablet  Commonly known as:  COZAAR  1 tablet in the morning and 1/2 tablet in the evening     oxyCODONE-acetaminophen 5-325 MG tablet  Commonly known  as:  PERCOCET/ROXICET  Take 1 tablet by mouth daily.     traMADol 50 MG tablet  Commonly known as:  ULTRAM  Take 50 mg by mouth as needed.           Follow-up Information    Follow up with Medrith Veillon L, MD In 3 weeks.   Specialty:  Neurosurgery   Why:  call the office to make an appointment   Contact information:   [address] [place] [address] [phone]       Signed: Winfield Cunas 08/07/2015, 12:09 PM

## 2015-08-07 NOTE — Discharge Instructions (Signed)
Care After ?These instructions give you information on caring for yourself after your procedure. Your doctor may also give you more specific instructions. Call your doctor if you have any problems or questions after your procedure. ?HOME CARE ?Take medicine as told by your doctor. ?Keep your wound covered for 24 hours or as told by your doctor. ?Ask your doctor when you can bathe or shower. ?Put ice on your wound if it helps your pain. ?Put ice in a plastic bag. ?Place a towel between your skin and the bag. ?Leave the ice on for 15 to 20 minutes, 3 to 4 times a day. ? ?Return to normal activities as told by your doctor. ?Ask your doctor what stretches and exercises you can do. ?Do not bend or lift anything heavy as told by your doctor. ?GET HELP RIGHT AWAY IF:  ?You have bad back pain that comes on suddenly. ?You cannot control when you pee (urinate) or poop (bowel movement). ?You lose feeling (numbness) in your legs or feet, or they become weak. ?You have shooting pain down your legs. ?You have a fever. ?Your wound becomes red, puffy (swollen), or tender to the touch. ?You are bleeding or leaking fluid from the wound. ?You are sick to your stomach (nauseous) or throw up (vomit) for more than 24 hours after the procedure. ?Your back pain does not get better. ?MAKE SURE YOU: ?Understand these instructions. ?Will watch your condition. ?Will get help right away if you are not doing well or get worse. ?Document Released: 11/09/2009 Document Revised: 11/07/2011 Document Reviewed: 01/19/2011 ?ExitCare? Patient Information ?2013 [employer].  ?

## 2015-08-07 NOTE — Progress Notes (Signed)
Pt doing well. Pt and family given D/C instructions with Rx's, verbal understanding was provided. Pt's incision is clean and dry with no sign of infection. Pt's IV was removed prior to D/C. Pt D/C'd home via wheelchair @ 1740 per MD order. Pt is stable @ D/C and has no other needs at this time. Holli Humbles, RN

## 2015-08-07 NOTE — Anesthesia Procedure Notes (Signed)
Procedure Name: Intubation Date/Time: 08/07/2015 11:02 AM Performed by: Merrilyn Puma B Pre-anesthesia Checklist: Patient identified, Patient being monitored, Timeout performed, Emergency Drugs available and Suction available Patient Re-evaluated:Patient Re-evaluated prior to inductionOxygen Delivery Method: Circle system utilized Preoxygenation: Pre-oxygenation with 100% oxygen Intubation Type: IV induction Laryngoscope Size: 3 and Mac Grade View: Grade I Tube type: Oral Tube size: 7.0 mm Number of attempts: 1 Airway Equipment and Method: Stylet Placement Confirmation: CO2 detector,  positive ETCO2,  ETT inserted through vocal cords under direct vision and breath sounds checked- equal and bilateral Secured at: 21 cm Tube secured with: Tape Dental Injury: Teeth and Oropharynx as per pre-operative assessment

## 2015-08-07 NOTE — H&P (Signed)
BP 206/56 mmHg  Pulse 70  Temp(Src) 98.2 F (36.8 C) (Oral)  Resp 20  Ht 5\' 5"  (1.651 m)  Wt 53.978 kg (119 lb)  BMI 19.80 kg/m2  SpO2 98%   HOPI:                                                   Yvonne [name] was lifting up a box about a week ago and felt a pop in her back.  She did not have immediate pain, but a few days after started having severe low back pain.  She has already had four previous compression fractures, three treated with kyphoplasty.  She said this felt just like the others.  When I was looking there is a subtle bend in the superior endplate of L3, but because I really cannot confirm confidently that this is a new fracture or if it is just the viewpoint of the x-ray, I would like her to undergo an MRI so we can see if there is edema within the bone.                          No Known Allergies Prior to Admission medications   Medication Sig Start Date End Date Taking? Authorizing Provider  amLODipine (NORVASC) 5 MG tablet Take 1 tablet by mouth  daily 07/29/15  Yes Mihai Croitoru, MD  aspirin 325 MG tablet Take 325 mg by mouth daily.   Yes Historical Provider, MD  calcium-vitamin D (OSCAL WITH D) 500-200 MG-UNIT per tablet Take 1 tablet by mouth daily at 12 noon.   Yes Historical Provider, MD  cholecalciferol (VITAMIN D) 1000 UNITS tablet Take 2,000 Units by mouth daily at 12 noon.    Yes Historical Provider, MD  flecainide (TAMBOCOR) 50 MG tablet Take one-half tablet by  mouth two times daily 12/15/14  Yes Mihai Croitoru, MD  levothyroxine (SYNTHROID, LEVOTHROID) 50 MCG tablet Take 50 mcg by mouth every morning.    Yes Historical Provider, MD  losartan (COZAAR) 50 MG tablet 1 tablet in the morning and 1/2 tablet in the evening Patient taking differently: Take 25-50 mg by mouth 2 (two) times daily. 1 tablet in the morning and 1/2 tablet in the evening 04/10/15  Yes Mihai Croitoru, MD  oxyCODONE-acetaminophen (PERCOCET/ROXICET) 5-325 MG tablet Take 1 tablet by mouth daily.  07/29/15  Yes Historical Provider, MD  traMADol (ULTRAM) 50 MG tablet Take 50 mg by mouth as needed.  05/01/14  Yes Historical Provider, MD  amoxicillin (AMOXIL) 500 MG tablet Take 4 tablets (2gram) by mouth one hour prior to dental work. 07/10/15   Sanda Klein, MD   Past Medical History  Diagnosis Date  . Thoracic aortic aneurysm ([place]) 05/2010    Ruptured ascending  . History of atrial paroxysmal tachycardia   . History of hypothyroidism   . Compression fracture of L2 lumbar vertebra ([facility])   . Acute blood loss anemia   . Volume overload   . Hypertension   . Mitral valve prolapse   . Hypothyroidism   . Afib ([place])     -no   . History of blood transfusion   . Shortness of breath     with  exertion  . UTI (lower urinary tract infection)   . Arthritis   . Pericardial tamponade 05/2010  .  Premature atrial contractions   . Syncope   . Heart murmur   . Moderate aortic insufficiency 05/25/2014  . Pneumonia     many years ago  . GERD (gastroesophageal reflux disease)     "mild"  . Cancer ([place])     skin cancer - arm squamous   Past Surgical History  Procedure Laterality Date  . Revision of left total hip replacement using some allograft  11/23/2010  . Classic atrioventricular nodal reentrant  10/20/2010  . L2 kyphoplasty using kyphon system.  03/17/2009  . Replacement of ascending aorta and hemiarch and resuspension of the aortic valve with circulatory cardiopulmonary bypass and circulatory arrest  06/25/2010    Dr Servando Snare  . Ablation    . Tubal ligation  1966  . Tonsillectomy    . Appendectomy    . Hernia repair  1993    Right Inguinal  . Eye surgery Bilateral 2003    cataract bilateral  . Toe osteotomy      Right 2nd   . Percutaneous pinning wrist fracture Bilateral 1995    bil  . Breast surgery Right     right biospy  . Kyphoplasty  09/05/2012    Procedure: KYPHOPLASTY;  Surgeon: Winfield Cunas, MD;  Location: [facility] [place] [facility];  Service: Neurosurgery;  Laterality: N/A;   L1 kyphoplasty  . Colonoscopy    . Kyphoplasty N/A 04/01/2014    Procedure: KYPHOPLASTY;  Surgeon: Ashok Pall, MD;  Location: [place] [facility];  Service: Neurosurgery;  Laterality: N/A;  Lumbar Four Kyphoplasty   Family History  Problem Relation Age of Onset  . Heart failure Mother   . Stroke Father   . Hypertension Brother   . Hypertension Brother   . Hypertension Sister   . Hypertension Sister   . Stroke Sister    Social History   Social History  . Marital Status: Widowed    Spouse Name: N/A  . Number of Children: N/A  . Years of Education: N/A   Occupational History  . Not on file.   Social History Main Topics  . Smoking status: Never Smoker   . Smokeless tobacco: Never Used  . Alcohol Use: No  . Drug Use: No  . Sexual Activity: Not on file   Other Topics Concern  . Not on file   Social History Narrative   MRI showed an acute fracture. I will therefore take her to the operating room for a kyphoplasty at L3. Risks and benefits well known to this patient who has undergone the procedure previously. The biggest is that the pain relief expected does not occur. Bleeding, infection, weakness, bowel/bladder dysfunction were discussed. She understands and wishes to proceed.

## 2015-08-07 NOTE — Transfer of Care (Signed)
Immediate Anesthesia Transfer of Care Note  Patient: Yvonne [name]  Procedure(s) Performed: Procedure(s): KYPHOPLASTY LUMBAR THREE (N/A)  Patient Location: PACU  Anesthesia Type:General  Level of Consciousness: awake, alert  and oriented  Airway & Oxygen Therapy: Patient Spontanous Breathing and Patient connected to nasal cannula oxygen  Post-op Assessment: Report given to RN, Post -op Vital signs reviewed and stable and Patient moving all extremities X 4  Post vital signs: Reviewed and stable  Last Vitals:  Filed Vitals:   08/07/15 1003  BP: 206/56  Pulse: 70  Temp: 36.8 C  Resp: 20    Complications: No apparent anesthesia complications

## 2015-08-07 NOTE — Anesthesia Postprocedure Evaluation (Signed)
Anesthesia Post Note  Patient: Yvonne [name]  Procedure(s) Performed: Procedure(s) (LRB): KYPHOPLASTY LUMBAR THREE (N/A)  Patient location during evaluation: PACU Anesthesia Type: General Level of consciousness: sedated Pain management: pain level controlled Vital Signs Assessment: post-procedure vital signs reviewed and stable Respiratory status: spontaneous breathing and respiratory function stable Cardiovascular status: stable Anesthetic complications: no    Last Vitals:  Filed Vitals:   08/07/15 1003 08/07/15 1219  BP: 206/56   Pulse: 70   Temp: 36.8 C 36.3 C  Resp: 20     Last Pain:  Filed Vitals:   08/07/15 1249  PainSc: 4                  Alonzo Owczarzak DANIEL

## 2015-08-07 NOTE — Anesthesia Preprocedure Evaluation (Addendum)
Anesthesia Evaluation  Patient identified by MRN, date of birth, ID band Patient awake    Reviewed: Allergy & Precautions, NPO status , Patient's Chart, lab work & pertinent test results  History of Anesthesia Complications Negative for: history of anesthetic complications  Airway Mallampati: II  TM Distance: >3 FB Neck ROM: Full    Dental  (+) Edentulous Upper, Dental Advisory Given   Pulmonary neg pulmonary ROS,    Pulmonary exam normal        Cardiovascular hypertension, Pt. on medications + CABG and + Peripheral Vascular Disease  Normal cardiovascular exam+ Valvular Problems/Murmurs AI and MVP   Echo 02/06/15 Study Conclusions  - Technical notes: Technically difficult echo due to VERY smallbody habitus, narrow rib spacing, significant scar tissuebuildup, and lung shadowing. - Left ventricle: The cavity size was normal. There was mild focalbasal hypertrophy of the septum. Systolic function was normal.The estimated ejection fraction was in the range of 60% to 65%.Wall motion was normal; there were no regional wall motionabnormalities. The study is not technically sufficient to allowevaluation of LV diastolic function. - Aortic valve: Mildly calcified leaflets. Transvalvular velocity was minimally increased. There was mild regurgitation. Valve area(VTI): 3.37 cm^2. Valve area (Vmax): 3.45 cm^2. Valve area(Vmean): 3.68 cm^2. - Aorta: Not visualized. - Mitral valve: Moderately thickened leaflets . There was trivialregurgitation.    Neuro/Psych negative neurological ROS  negative psych ROS   GI/Hepatic Neg liver ROS, GERD  ,  Endo/Other  Hypothyroidism   Renal/GU negative Renal ROS     Musculoskeletal   Abdominal   Peds  Hematology   Anesthesia Other Findings   Reproductive/Obstetrics                            Anesthesia Physical Anesthesia Plan  ASA: III  Anesthesia  Plan: General   Post-op Pain Management:    Induction: Intravenous  Airway Management Planned: Oral ETT  Additional Equipment:   Intra-op Plan:   Post-operative Plan: Extubation in OR  Informed Consent: I have reviewed the patients History and Physical, chart, labs and discussed the procedure including the risks, benefits and alternatives for the proposed anesthesia with the patient or authorized representative who has indicated his/her understanding and acceptance.   Dental advisory given  Plan Discussed with: Anesthesiologist and Surgeon  Anesthesia Plan Comments:         Anesthesia Quick Evaluation

## 2015-08-10 ENCOUNTER — Encounter (HOSPITAL_COMMUNITY): Payer: Self-pay | Admitting: Neurosurgery

## 2015-08-20 NOTE — Op Note (Signed)
08/07/2015  9:11 AM  PATIENT:  Yvonne [name]  81 y.o. female who presents with a compression fracture of L3 and significant back pain. She has opted for kyphoplasty as she has had good results before.  PRE-OPERATIVE DIAGNOSIS:  Lumbar three fracture  POST-OPERATIVE DIAGNOSIS:  Lumbar three fracture  PROCEDURE:  Procedure(s): KYPHOPLASTY LUMBAR THREE  SURGEON:  Surgeon(s): Ashok Pall, MD  ANESTHESIA:   general  EBL:     BLOOD ADMINISTERED:none  COUNT:per nursing  SPECIMEN:  No Specimen  DICTATION: [name] was taken to the operating room, intubated and placed under a general anesthetic without difficulty. She was positioned prone on the operating room table with all pressure points properly padded. Her back was prepped and draped in a sterile manner. With fluoroscopy I localized the L3 pedicles bilaterally. I injected lidocaine into the entry sites on both the left and right sides. I started by making a stab incision on the right side then the left and entering the Right, and Left L3 pedicles with fluoroscopic guidance. Once good position was obtained, I drilled into the vertebral body. I then placed the kyphoplasty balloon into the L3 vertebra and inflated the balloon. I then inserted ~6 of methylmethacrylate into the vertebral body under fluoroscopic guidance. I achieved a good fill of the cavity, staying within the confines of the vertebral body. I removed the instrumentation from the vertebral body, and the final films looked good. I closed the stab incision with vicryl suture and used Dermabond for a sterile dressing. Marland Kitchen    PLAN OF CARE: Admit to inpatient   PATIENT DISPOSITION:  PACU - hemodynamically stable.   Delay start of Pharmacological VTE agent (>24hrs) due to surgical blood loss or risk of bleeding:  yes

## 2015-10-08 ENCOUNTER — Other Ambulatory Visit: Payer: Self-pay | Admitting: Cardiovascular Disease

## 2015-10-08 NOTE — Telephone Encounter (Signed)
REFILL 

## 2015-11-02 ENCOUNTER — Other Ambulatory Visit: Payer: Self-pay | Admitting: Cardiovascular Disease

## 2015-11-02 NOTE — Telephone Encounter (Signed)
Rx(s) sent to pharmacy electronically.  

## 2015-11-24 ENCOUNTER — Ambulatory Visit (INDEPENDENT_AMBULATORY_CARE_PROVIDER_SITE_OTHER): Payer: Medicare Other

## 2015-11-24 ENCOUNTER — Other Ambulatory Visit: Payer: Self-pay | Admitting: Neurosurgery

## 2015-11-24 DIAGNOSIS — M4854XA Collapsed vertebra, not elsewhere classified, thoracic region, initial encounter for fracture: Secondary | ICD-10-CM | POA: Diagnosis not present

## 2015-11-24 DIAGNOSIS — M545 Low back pain: Secondary | ICD-10-CM | POA: Diagnosis not present

## 2015-11-24 DIAGNOSIS — S32050A Wedge compression fracture of fifth lumbar vertebra, initial encounter for closed fracture: Secondary | ICD-10-CM

## 2015-11-24 DIAGNOSIS — IMO0002 Reserved for concepts with insufficient information to code with codable children: Secondary | ICD-10-CM

## 2015-11-24 NOTE — Progress Notes (Unsigned)
Patient ID: Yvonne [name], female   DOB: 1925/12/21, 82 y.o.   MRN: [id number] Xray ordered for possible compression fracture lumbar spine.

## 2015-11-27 ENCOUNTER — Other Ambulatory Visit: Payer: Self-pay | Admitting: Neurosurgery

## 2015-11-27 DIAGNOSIS — S32050A Wedge compression fracture of fifth lumbar vertebra, initial encounter for closed fracture: Secondary | ICD-10-CM

## 2015-11-30 ENCOUNTER — Ambulatory Visit
Admission: RE | Admit: 2015-11-30 | Discharge: 2015-11-30 | Disposition: A | Discharge status: Home / Self Care | Payer: Medicare Other | Source: Ambulatory Visit | Attending: Neurosurgery | Admitting: Neurosurgery

## 2015-11-30 DIAGNOSIS — S32050A Wedge compression fracture of fifth lumbar vertebra, initial encounter for closed fracture: Secondary | ICD-10-CM

## 2015-12-08 ENCOUNTER — Encounter (HOSPITAL_COMMUNITY): Payer: Self-pay | Admitting: *Deleted

## 2015-12-08 ENCOUNTER — Other Ambulatory Visit: Payer: Self-pay | Admitting: Neurosurgery

## 2015-12-08 NOTE — Progress Notes (Signed)
Mrs Yvonne [name] Patient was instructed nothing to eat or drink after light breakfast before 10am.

## 2015-12-09 ENCOUNTER — Encounter (HOSPITAL_COMMUNITY): Payer: Self-pay | Admitting: *Deleted

## 2015-12-09 ENCOUNTER — Observation Stay (HOSPITAL_COMMUNITY)
Admission: RE | Admit: 2015-12-09 | Discharge: 2015-12-10 | Disposition: A | Discharge status: Home / Self Care | Payer: Medicare Other | Source: Ambulatory Visit | Attending: Neurosurgery | Admitting: Neurosurgery

## 2015-12-09 ENCOUNTER — Observation Stay (HOSPITAL_COMMUNITY): Payer: Medicare Other

## 2015-12-09 ENCOUNTER — Observation Stay (HOSPITAL_COMMUNITY): Payer: Medicare Other | Admitting: Anesthesiology

## 2015-12-09 ENCOUNTER — Encounter (HOSPITAL_COMMUNITY)
Admission: RE | Disposition: A | Discharge status: Home / Self Care | Payer: Self-pay | Source: Ambulatory Visit | Attending: Neurosurgery

## 2015-12-09 DIAGNOSIS — K219 Gastro-esophageal reflux disease without esophagitis: Secondary | ICD-10-CM | POA: Insufficient documentation

## 2015-12-09 DIAGNOSIS — E039 Hypothyroidism, unspecified: Secondary | ICD-10-CM | POA: Insufficient documentation

## 2015-12-09 DIAGNOSIS — Z96642 Presence of left artificial hip joint: Secondary | ICD-10-CM | POA: Diagnosis not present

## 2015-12-09 DIAGNOSIS — Z955 Presence of coronary angioplasty implant and graft: Secondary | ICD-10-CM | POA: Diagnosis not present

## 2015-12-09 DIAGNOSIS — I10 Essential (primary) hypertension: Secondary | ICD-10-CM | POA: Diagnosis not present

## 2015-12-09 DIAGNOSIS — Z419 Encounter for procedure for purposes other than remedying health state, unspecified: Secondary | ICD-10-CM

## 2015-12-09 DIAGNOSIS — S32050A Wedge compression fracture of fifth lumbar vertebra, initial encounter for closed fracture: Secondary | ICD-10-CM | POA: Diagnosis present

## 2015-12-09 DIAGNOSIS — M4856XA Collapsed vertebra, not elsewhere classified, lumbar region, initial encounter for fracture: Secondary | ICD-10-CM | POA: Diagnosis present

## 2015-12-09 DIAGNOSIS — R0602 Shortness of breath: Secondary | ICD-10-CM | POA: Insufficient documentation

## 2015-12-09 HISTORY — PX: KYPHOPLASTY: SHX5884

## 2015-12-09 LAB — CBC
HEMATOCRIT: 41.8 % (ref 36.0–46.0)
Hemoglobin: 13.5 g/dL (ref 12.0–15.0)
MCH: 30.1 pg (ref 26.0–34.0)
MCHC: 32.3 g/dL (ref 30.0–36.0)
MCV: 93.3 fL (ref 78.0–100.0)
Platelets: 164 10*3/uL (ref 150–400)
RBC: 4.48 MIL/uL (ref 3.87–5.11)
RDW: 13.9 % (ref 11.5–15.5)
WBC: 5.4 10*3/uL (ref 4.0–10.5)

## 2015-12-09 LAB — BASIC METABOLIC PANEL
Anion gap: 12 (ref 5–15)
BUN: 22 mg/dL — AB (ref 6–20)
CHLORIDE: 101 mmol/L (ref 101–111)
CO2: 24 mmol/L (ref 22–32)
Calcium: 9.5 mg/dL (ref 8.9–10.3)
Creatinine, Ser: 1 mg/dL (ref 0.44–1.00)
GFR calc Af Amer: 57 mL/min — ABNORMAL LOW (ref >60)
GFR calc non Af Amer: 49 mL/min — ABNORMAL LOW (ref >60)
GLUCOSE: 98 mg/dL (ref 65–99)
POTASSIUM: 4.8 mmol/L (ref 3.5–5.1)
Sodium: 137 mmol/L (ref 135–145)

## 2015-12-09 SURGERY — KYPHOPLASTY
Anesthesia: General | Site: Back

## 2015-12-09 MED ORDER — PHENYLEPHRINE HCL 10 MG/ML IJ SOLN
INTRAMUSCULAR | Status: DC | PRN
  Administered 2015-12-09: 80 ug via INTRAVENOUS
  Administered 2015-12-09 (×2): 40 ug via INTRAVENOUS

## 2015-12-09 MED ORDER — ONDANSETRON HCL 4 MG/2ML IJ SOLN
INTRAMUSCULAR | Status: AC
  Filled 2015-12-09: qty 2

## 2015-12-09 MED ORDER — ROCURONIUM BROMIDE 100 MG/10ML IV SOLN
INTRAVENOUS | Status: DC | PRN
  Administered 2015-12-09: 20 mg via INTRAVENOUS
  Administered 2015-12-09: 10 mg via INTRAVENOUS

## 2015-12-09 MED ORDER — ROCURONIUM BROMIDE 50 MG/5ML IV SOLN
INTRAVENOUS | Status: AC
Start: 2015-12-09 — End: 2015-12-09
  Filled 2015-12-09: qty 1

## 2015-12-09 MED ORDER — SUGAMMADEX SODIUM 200 MG/2ML IV SOLN
INTRAVENOUS | Status: DC | PRN
  Administered 2015-12-09: 200 mg via INTRAVENOUS

## 2015-12-09 MED ORDER — ONDANSETRON 4 MG PO TBDP: 4.0000 mg | ORAL_TABLET | Freq: Three times a day (TID) | ORAL | Status: DC | PRN

## 2015-12-09 MED ORDER — EPHEDRINE SULFATE 50 MG/ML IJ SOLN
INTRAMUSCULAR | Status: AC
  Filled 2015-12-09: qty 1

## 2015-12-09 MED ORDER — TRAMADOL HCL 50 MG PO TABS: 50.0000 mg | ORAL_TABLET | Freq: Four times a day (QID) | ORAL | Status: DC | PRN

## 2015-12-09 MED ORDER — SUFENTANIL CITRATE 50 MCG/ML IV SOLN
INTRAVENOUS | Status: DC | PRN
  Administered 2015-12-09 (×2): 5 ug via INTRAVENOUS

## 2015-12-09 MED ORDER — SODIUM CHLORIDE 0.9 % IJ SOLN
INTRAMUSCULAR | Status: AC
  Filled 2015-12-09: qty 10

## 2015-12-09 MED ORDER — POTASSIUM CHLORIDE IN NACL 20-0.9 MEQ/L-% IV SOLN
INTRAVENOUS | Status: DC
  Administered 2015-12-09: 23:00:00 via INTRAVENOUS
  Filled 2015-12-09: qty 1000

## 2015-12-09 MED ORDER — PHENYLEPHRINE HCL 10 MG/ML IJ SOLN
INTRAMUSCULAR | Status: AC
  Filled 2015-12-09: qty 1

## 2015-12-09 MED ORDER — PROPOFOL 10 MG/ML IV BOLUS
INTRAVENOUS | Status: DC | PRN
  Administered 2015-12-09: 80 mg via INTRAVENOUS

## 2015-12-09 MED ORDER — CEFAZOLIN SODIUM 1-5 GM-% IV SOLN
INTRAVENOUS | Status: AC
  Administered 2015-12-09: 1 g via INTRAVENOUS
  Filled 2015-12-09: qty 50

## 2015-12-09 MED ORDER — ONDANSETRON HCL 4 MG/2ML IJ SOLN
INTRAMUSCULAR | Status: DC | PRN
  Administered 2015-12-09: 4 mg via INTRAVENOUS

## 2015-12-09 MED ORDER — PROPOFOL 10 MG/ML IV BOLUS
INTRAVENOUS | Status: AC
  Filled 2015-12-09: qty 20

## 2015-12-09 MED ORDER — LIDOCAINE-EPINEPHRINE 0.5 %-1:200000 IJ SOLN
INTRAMUSCULAR | Status: DC | PRN
  Administered 2015-12-09: 5 mL via INTRADERMAL

## 2015-12-09 MED ORDER — LACTATED RINGERS IV SOLN
INTRAVENOUS | Status: DC | PRN
Start: 2015-12-09 — End: 2015-12-09
  Administered 2015-12-09 (×2): via INTRAVENOUS

## 2015-12-09 MED ORDER — SUCCINYLCHOLINE CHLORIDE 20 MG/ML IJ SOLN
INTRAMUSCULAR | Status: DC | PRN
  Administered 2015-12-09: 80 mg via INTRAVENOUS

## 2015-12-09 MED ORDER — LACTATED RINGERS IV SOLN: INTRAVENOUS | Status: DC

## 2015-12-09 MED ORDER — SUCCINYLCHOLINE CHLORIDE 20 MG/ML IJ SOLN
INTRAMUSCULAR | Status: AC
  Filled 2015-12-09: qty 1

## 2015-12-09 MED ORDER — SUFENTANIL CITRATE 50 MCG/ML IV SOLN
INTRAVENOUS | Status: AC
  Filled 2015-12-09: qty 1

## 2015-12-09 MED ORDER — SUGAMMADEX SODIUM 200 MG/2ML IV SOLN
INTRAVENOUS | Status: AC
  Filled 2015-12-09: qty 2

## 2015-12-09 MED ORDER — ALBUMIN HUMAN 5 % IV SOLN
INTRAVENOUS | Status: DC | PRN
  Administered 2015-12-09: 22:00:00 via INTRAVENOUS

## 2015-12-09 MED ORDER — LIDOCAINE HCL (CARDIAC) 20 MG/ML IV SOLN
INTRAVENOUS | Status: DC | PRN
  Administered 2015-12-09: 60 mg via INTRAVENOUS

## 2015-12-09 MED ORDER — EPHEDRINE SULFATE 50 MG/ML IJ SOLN
INTRAMUSCULAR | Status: DC | PRN
  Administered 2015-12-09 (×2): 5 mg via INTRAVENOUS
  Administered 2015-12-09 (×2): 10 mg via INTRAVENOUS

## 2015-12-09 MED ORDER — LIDOCAINE HCL (CARDIAC) 20 MG/ML IV SOLN
INTRAVENOUS | Status: AC
  Filled 2015-12-09: qty 5

## 2015-12-09 SURGICAL SUPPLY — 40 items
BLADE CLIPPER SURG (BLADE) IMPLANT
BLADE SURG 15 STRL LF DISP TIS (BLADE) ×1 IMPLANT
BLADE SURG 15 STRL SS (BLADE) ×3
CEMENT BONE KYPHX HV R (Orthopedic Implant) ×2 IMPLANT
DRAPE C-ARM 42X72 X-RAY (DRAPES) ×3 IMPLANT
DRAPE LAPAROTOMY 100X72X124 (DRAPES) ×3 IMPLANT
DRAPE PROXIMA HALF (DRAPES) ×3 IMPLANT
DRAPE SURG 17X23 STRL (DRAPES) ×3 IMPLANT
DURAPREP 26ML APPLICATOR (WOUND CARE) ×3 IMPLANT
GAUZE SPONGE 4X4 16PLY XRAY LF (GAUZE/BANDAGES/DRESSINGS) ×3 IMPLANT
GLOVE BIO SURGEON STRL SZ 6.5 (GLOVE) ×1 IMPLANT
GLOVE BIO SURGEONS STRL SZ 6.5 (GLOVE) ×1
GLOVE BIOGEL PI IND STRL 6.5 (GLOVE) IMPLANT
GLOVE BIOGEL PI INDICATOR 6.5 (GLOVE) ×2
GLOVE ECLIPSE 6.5 STRL STRAW (GLOVE) ×3 IMPLANT
GLOVE EXAM NITRILE LRG STRL (GLOVE) IMPLANT
GLOVE EXAM NITRILE MD LF STRL (GLOVE) IMPLANT
GLOVE EXAM NITRILE XL STR (GLOVE) IMPLANT
GLOVE EXAM NITRILE XS STR PU (GLOVE) IMPLANT
GOWN STRL REUS W/ TWL LRG LVL3 (GOWN DISPOSABLE) ×2 IMPLANT
GOWN STRL REUS W/ TWL XL LVL3 (GOWN DISPOSABLE) IMPLANT
GOWN STRL REUS W/TWL 2XL LVL3 (GOWN DISPOSABLE) IMPLANT
GOWN STRL REUS W/TWL LRG LVL3 (GOWN DISPOSABLE) ×6
GOWN STRL REUS W/TWL XL LVL3 (GOWN DISPOSABLE)
KIT BASIN OR (CUSTOM PROCEDURE TRAY) ×3 IMPLANT
KIT ROOM TURNOVER OR (KITS) ×3 IMPLANT
LIQUID BAND (GAUZE/BANDAGES/DRESSINGS) ×3 IMPLANT
MIXER KYPHON (MISCELLANEOUS) ×2 IMPLANT
NDL HYPO 25X1 1.5 SAFETY (NEEDLE) ×1 IMPLANT
NEEDLE HYPO 25X1 1.5 SAFETY (NEEDLE) ×3 IMPLANT
NS IRRIG 1000ML POUR BTL (IV SOLUTION) ×3 IMPLANT
PACK SURGICAL SETUP 50X90 (CUSTOM PROCEDURE TRAY) ×3 IMPLANT
PAD ARMBOARD 7.5X6 YLW CONV (MISCELLANEOUS) ×9 IMPLANT
SPECIMEN JAR SMALL (MISCELLANEOUS) IMPLANT
STAPLER SKIN PROX WIDE 3.9 (STAPLE) ×3 IMPLANT
SUT VIC AB 3-0 SH 8-18 (SUTURE) ×3 IMPLANT
SYR CONTROL 10ML LL (SYRINGE) ×6 IMPLANT
TOWEL OR 17X24 6PK STRL BLUE (TOWEL DISPOSABLE) ×3 IMPLANT
TOWEL OR 17X26 10 PK STRL BLUE (TOWEL DISPOSABLE) ×3 IMPLANT
TRAY KYPHOPAK 20/3 ONESTEP 1ST (MISCELLANEOUS) ×2 IMPLANT

## 2015-12-09 NOTE — Transfer of Care (Signed)
Immediate Anesthesia Transfer of Care Note  Patient: Yvonne [name]  Procedure(s) Performed: Procedure(s) with comments: Lumbar five, KYPHOPLASTY (N/A) - L5 kyphoplasty  Patient Location: PACU  Anesthesia Type:General  Level of Consciousness: awake, alert  and oriented  Airway & Oxygen Therapy: Patient Spontanous Breathing and Patient connected to face mask oxygen  Post-op Assessment: Report given to RN and Post -op Vital signs reviewed and stable  Post vital signs: Reviewed and stable  Last Vitals:  Filed Vitals:   12/09/15 1746 12/09/15 2026  BP: 206/62 170/52  Pulse: 73 74  Temp: 36.8 C 36.9 C  Resp: 16 16    Complications: No apparent anesthesia complications

## 2015-12-09 NOTE — Op Note (Signed)
12/09/2015  10:46 PM  PATIENT:  Yvonne [name]  82 y.o. female with new onset lumbar pain and ct evidence L5 compression fracture  PRE-OPERATIVE DIAGNOSIS:  Lumbar five compression fracture  POST-OPERATIVE DIAGNOSIS:  Lumbar five compression fracture  PROCEDURE:  Procedure(s): Lumbar five, KYPHOPLASTY  SURGEON:  Surgeon(s): Ashok Pall, MD  ANESTHESIA:   general  EBL:  Total I/O In: 1500 [I.V.:1250; IV Piggyback:250] Out: 15 [Blood:15]  BLOOD ADMINISTERED:none  COUNT:per nursing  SPECIMEN:  No Specimen  DICTATION: [name] was taken to the operating room, intubated and placed under a general anesthetic without difficulty. She was positioned prone on the operating room table with all pressure points properly padded. Her back was prepped and draped in a sterile manner. With fluoroscopy I localized the L5 pedicle on the right and Left side. I injected lidocaine into the entry sites on both the left and right sides. I started by making a stab incision on the right side and entering the right L5 pedicle with fluoroscopic guidance. Once good position was obtained, I drilled into the vertebral body. I then placed the kyphoplasty balloon into the L5 vertebra and inflated the balloon. I then inserted 6cc of methylmethacrylate into the vertebral body under fluoroscopic guidance. I achieved a good fill of the cavity, staying within the confines of the vertebral body. I removed the instrumentation from the vertebral body, and the final films looked good. I closed the stab incision with vicryl suture and used Dermabond for a sterile dressing. Marland Kitchen    PLAN OF CARE: Admit to inpatient   PATIENT DISPOSITION:  PACU - hemodynamically stable.   Delay start of Pharmacological VTE agent (>24hrs) due to surgical blood loss or risk of bleeding:  yes

## 2015-12-09 NOTE — Progress Notes (Signed)
Patient will be going to [facility].  Request has been made d/t surgical delay.

## 2015-12-09 NOTE — Anesthesia Procedure Notes (Signed)
Procedure Name: Intubation Date/Time: 12/09/2015 9:10 PM Performed by: Giuseppina Quinones S Pre-anesthesia Checklist: Patient identified, Timeout performed, Emergency Drugs available, Suction available and Patient being monitored Patient Re-evaluated:Patient Re-evaluated prior to inductionOxygen Delivery Method: Circle system utilized Preoxygenation: Pre-oxygenation with 100% oxygen Intubation Type: IV induction Ventilation: Mask ventilation without difficulty Laryngoscope Size: Mac and 3 Grade View: Grade I Tube type: Oral Tube size: 7.0 mm Number of attempts: 1 Airway Equipment and Method: Stylet Placement Confirmation: ETT inserted through vocal cords under direct vision,  positive ETCO2 and breath sounds checked- equal and bilateral Secured at: 22 cm Tube secured with: Tape Dental Injury: Teeth and Oropharynx as per pre-operative assessment

## 2015-12-09 NOTE — H&P (Signed)
BP 206/62 mmHg  Pulse 73  Temp(Src) 98.3 F (36.8 C)  Resp 16  Ht 5\' 6"  (1.676 m)  Wt 54.432 kg (120 lb)  BMI 19.38 kg/m2  SpO2 98%   Yvonne [name] returned today.  Yvonne [name] was just doing her normal things, reached in front of her to hang a towel, and felt a pop in her back, and severe pain in the lower back.  I had her sent today for x-rays of the lumbar spine.  I think Yvonne [name] has had a superior endplate fracture of L5.  I will await the radiologist reading of the film. This was confirmed on CT of the lumbar spine. No Known Allergies Past Medical History  Diagnosis Date  . Thoracic aortic aneurysm ([place]) 05/2010    Ruptured ascending  . History of atrial paroxysmal tachycardia   . History of hypothyroidism   . Compression fracture of L2 lumbar vertebra ([facility])   . Acute blood loss anemia   . Volume overload   . Hypertension   . Mitral valve prolapse   . Hypothyroidism   . Afib ([place])     -no   . History of blood transfusion   . Shortness of breath     with  exertion  . UTI (lower urinary tract infection)   . Arthritis   . Pericardial tamponade 05/2010  . Premature atrial contractions   . Syncope   . Heart murmur   . Moderate aortic insufficiency 05/25/2014  . Pneumonia     many years ago  . GERD (gastroesophageal reflux disease)     "mild"  . Cancer ([place])     skin cancer - arm squamous   Past Surgical History  Procedure Laterality Date  . Revision of left total hip replacement using some allograft  11/23/2010  . Classic atrioventricular nodal reentrant  10/20/2010  . L2 kyphoplasty using kyphon system.  03/17/2009  . Replacement of ascending aorta and hemiarch and resuspension of the aortic valve with circulatory cardiopulmonary bypass and circulatory arrest  06/25/2010    Dr Servando Snare  . Ablation    . Tubal ligation  1966  . Tonsillectomy    . Appendectomy    . Hernia repair  1993    Right Inguinal  . Eye surgery Bilateral 2003    cataract bilateral  . Toe osteotomy       Right 2nd   . Percutaneous pinning wrist fracture Bilateral 1995    bil  . Breast surgery Right     right biospy  . Kyphoplasty  09/05/2012    Procedure: KYPHOPLASTY;  Surgeon: Winfield Cunas, MD;  Location: [facility];  Service: Neurosurgery;  Laterality: N/A;  L1 kyphoplasty  . Colonoscopy    . Kyphoplasty N/A 04/01/2014    Procedure: KYPHOPLASTY;  Surgeon: Ashok Pall, MD;  Location: [facility];  Service: Neurosurgery;  Laterality: N/A;  Lumbar Four Kyphoplasty  . Kyphoplasty N/A 08/07/2015    Procedure: KYPHOPLASTY LUMBAR THREE;  Surgeon: Ashok Pall, MD;  Location: [place];  Service: Neurosurgery;  Laterality: N/A;   Family History  Problem Relation Age of Onset  . Heart failure Mother   . Stroke Father   . Hypertension Brother   . Hypertension Brother   . Hypertension Sister   . Hypertension Sister   . Stroke Sister    Social History   Social History  . Marital Status: Widowed    Spouse Name: N/A  . Number of Children: N/A  . Years of  Education: N/A   Occupational History  . Not on file.   Social History Main Topics  . Smoking status: Never Smoker   . Smokeless tobacco: Never Used  . Alcohol Use: No  . Drug Use: No  . Sexual Activity: Not on file   Other Topics Concern  . Not on file   Social History Narrative   No results found.    PHYSICAL EXAMINATION:                     Neurologically, Yvonne [name] is normal.  Height 5 feet 5.25 inches, weight 120.2 pounds, temperature is 97.8, blood pressure is 107/61, pulse is 85, respiratory rate is 12.  Pain is 8/10.  Yvonne [name] is alert, oriented x4, answering all questions appropriately.  5/5 strength in upper and lower extremities.  Yvonne [name] is using a walker but moving very well.  Speech is clear and fluent.     IMPRESSION/PLAN:  Kyphoplasty L5.

## 2015-12-09 NOTE — Anesthesia Preprocedure Evaluation (Addendum)
Anesthesia Evaluation  Patient identified by MRN, date of birth, ID band Patient awake    Reviewed: Allergy & Precautions, NPO status , Patient's Chart, lab work & pertinent test results  History of Anesthesia Complications Negative for: history of anesthetic complications  Airway Mallampati: II  TM Distance: >3 FB Neck ROM: Full    Dental  (+) Edentulous Upper, Dental Advisory Given   Pulmonary neg pulmonary ROS, shortness of breath and with exertion,    Pulmonary exam normal        Cardiovascular hypertension, Pt. on medications + CABG and + Peripheral Vascular Disease  Normal cardiovascular exam+ Valvular Problems/Murmurs AI and MVP  Rhythm:Regular Rate:Normal + Systolic murmurs Echo [id number] Study Conclusions  - Technical notes: Technically difficult echo due to VERY smallbody habitus, narrow rib spacing, significant scar tissuebuildup, and lung shadowing. - Left ventricle: The cavity size was normal. There was mild focalbasal hypertrophy of the septum. Systolic function was normal.The estimated ejection fraction was in the range of 60% to 65%.Wall motion was normal; there were no regional wall motionabnormalities. The study is not technically sufficient to allowevaluation of LV diastolic function. - Aortic valve: Mildly calcified leaflets. Transvalvular velocity was minimally increased. There was mild regurgitation. Valve area(VTI): 3.37 cm^2. Valve area (Vmax): 3.45 cm^2. Valve area(Vmean): 3.68 cm^2. - Aorta: Not visualized. - Mitral valve: Moderately thickened leaflets . There was trivialregurgitation.    Neuro/Psych negative neurological ROS  negative psych ROS   GI/Hepatic Neg liver ROS, neg GERD  ,  Endo/Other  Hypothyroidism   Renal/GU negative Renal ROS     Musculoskeletal  (+) Arthritis , Osteoarthritis,    Abdominal   Peds  Hematology negative hematology ROS (+)   Anesthesia  Other Findings   Reproductive/Obstetrics                          Anesthesia Physical Anesthesia Plan  ASA: IV  Anesthesia Plan: General   Post-op Pain Management:    Induction: Intravenous  Airway Management Planned: Oral ETT  Additional Equipment:   Intra-op Plan:   Post-operative Plan: Extubation in OR  Informed Consent: I have reviewed the patients History and Physical, chart, labs and discussed the procedure including the risks, benefits and alternatives for the proposed anesthesia with the patient or authorized representative who has indicated his/her understanding and acceptance.     Plan Discussed with:   Anesthesia Plan Comments:         Anesthesia Quick Evaluation

## 2015-12-09 NOTE — Discharge Summary (Signed)
  Physician Discharge Summary  Patient ID: Yvonne [name] MRN: [id number] DOB/AGE: 05-Jul-1926 82 y.o.  Admit date: 12/09/2015 Discharge date: 12/09/2015  Admission Diagnoses:L5 compression fracture  Discharge Diagnoses:  Active Problems:   Surgery, elective   Compression fracture of L5 lumbar vertebra [facility])   Discharged Condition: good  Hospital Course: Mrs. Pirkl was admitted and taken to the operating room for an uncomplicated Kyphoplasty at L5. Post op she is voiding, ambulating, and tolerating a regular diet. Her wound is clean, dry, and without signs of infection  Treatments: surgery: Kyphoplasty L5  Discharge Exam: Blood pressure 159/67, pulse 82, temperature 97.6 F (36.4 C), temperature source Oral, resp. rate 10, height 5\' 6"  (1.676 m), weight 54.432 kg (120 lb), SpO2 96 %. General appearance: alert, cooperative, appears stated age and no distress Neurologic: Alert and oriented X 3, normal strength and tone. Normal symmetric reflexes. Normal coordination and gait  Disposition: 01-Home or Self Care Lumbar five compression fracture    Medication List    TAKE these medications        amLODipine 5 MG tablet  Commonly known as:  NORVASC  Take 1 tablet by mouth  daily     amoxicillin 500 MG tablet  Commonly known as:  AMOXIL  Take 4 tablets (2gram) by mouth one hour prior to dental work.     aspirin 325 MG tablet  Take 325 mg by mouth daily.     calcium-vitamin D 500-200 MG-UNIT tablet  Commonly known as:  OSCAL WITH D  Take 1 tablet by mouth daily at 12 noon.     cholecalciferol 1000 units tablet  Commonly known as:  VITAMIN D  Take 2,000 Units by mouth daily at 12 noon.     flecainide 50 MG tablet  Commonly known as:  TAMBOCOR  Take 0.5 tablets (25 mg total) by mouth 2 (two) times daily.     levothyroxine 50 MCG tablet  Commonly known as:  SYNTHROID, LEVOTHROID  Take 50 mcg by mouth every morning.     losartan 50 MG tablet  Commonly known as:   COZAAR  Take 1 tablet by mouth in  the morning and 1/2 tablet  in the evening     ondansetron 4 MG disintegrating tablet  Commonly known as:  ZOFRAN ODT  Take 1 tablet (4 mg total) by mouth every 8 (eight) hours as needed for nausea or vomiting.     oxyCODONE-acetaminophen 5-325 MG tablet  Commonly known as:  PERCOCET/ROXICET  Take 1 tablet by mouth daily as needed for moderate pain. Reported on 12/08/2015     traMADol 50 MG tablet  Commonly known as:  ULTRAM  Take 1 tablet (50 mg total) by mouth every 6 (six) hours as needed for moderate pain.           Follow-up Information    Follow up with Quincee Gittens L, MD In 3 weeks.   Specialty:  Neurosurgery   Why:  call the office to make an appointment   Contact information:   [address] [place] [address] [phone]       Signed: Winfield Cunas 12/09/2015, 11:14 PM

## 2015-12-10 ENCOUNTER — Encounter (HOSPITAL_COMMUNITY): Payer: Self-pay | Admitting: Neurosurgery

## 2015-12-10 DIAGNOSIS — M4856XA Collapsed vertebra, not elsewhere classified, lumbar region, initial encounter for fracture: Secondary | ICD-10-CM | POA: Diagnosis not present

## 2015-12-10 MED ORDER — OXYCODONE-ACETAMINOPHEN 5-325 MG PO TABS: 1.0000 | ORAL_TABLET | Freq: Every day | ORAL | Status: DC | PRN

## 2015-12-10 MED ORDER — OXYCODONE-ACETAMINOPHEN 5-325 MG PO TABS
1.0000 | ORAL_TABLET | Freq: Every day | ORAL | Status: DC | PRN
  Administered 2015-12-10: 1 via ORAL
  Filled 2015-12-10: qty 1

## 2015-12-10 NOTE — Progress Notes (Signed)
Patient alert and oriented, mae's well, voiding adequate amount of urine, swallowing without difficulty, no c/o pain. Patient discharged home with family. Script and discharged instructions given to patient. Patient and family stated understanding of d/c instructions given and has an appointment with MD. 

## 2015-12-10 NOTE — Discharge Instructions (Signed)
Wound Care Leave incision open to air. You may shower. Do not scrub directly on incision.  Do not put any creams, lotions, or ointments on incision. Activity Walk each and every day, increasing distance each day. No lifting greater than 5 lbs.  Avoid bending, arching, and twisting.   Diet Resume your normal diet.  . Call Your Doctor If Any of These Occur Redness, drainage, or swelling at the wound.  Temperature greater than 101 degrees. Severe pain not relieved by pain medication. Incision starts to come apart. Follow Up Appt Call today for appointment in 3 weeks [phone]) or for problems.

## 2015-12-11 NOTE — Anesthesia Postprocedure Evaluation (Signed)
Anesthesia Post Note  Patient: Yvonne [name]  Procedure(s) Performed: Procedure(s) (LRB): Lumbar five, KYPHOPLASTY (N/A)  Patient location during evaluation: PACU Anesthesia Type: General Level of consciousness: awake and alert Pain management: pain level controlled Vital Signs Assessment: post-procedure vital signs reviewed and stable Respiratory status: spontaneous breathing, nonlabored ventilation, respiratory function stable and patient connected to nasal cannula oxygen Cardiovascular status: blood pressure returned to baseline and stable Postop Assessment: no signs of nausea or vomiting Anesthetic complications: no    Last Vitals:  Filed Vitals:   12/10/15 0400 12/10/15 0743  BP: 109/44 120/49  Pulse: 78 73  Temp: 36.9 C 37 C  Resp: 16 16    Last Pain:  Filed Vitals:   12/10/15 0743  PainSc: 2                  Nolton Denis,JAMES TERRILL

## 2015-12-19 ENCOUNTER — Other Ambulatory Visit: Payer: Self-pay | Admitting: Cardiovascular Disease

## 2015-12-21 NOTE — Telephone Encounter (Signed)
REFILL 

## 2015-12-24 ENCOUNTER — Encounter: Payer: Self-pay | Admitting: Cardiovascular Disease

## 2015-12-24 ENCOUNTER — Ambulatory Visit (INDEPENDENT_AMBULATORY_CARE_PROVIDER_SITE_OTHER): Payer: Medicare Other | Admitting: Cardiovascular Disease

## 2015-12-24 VITALS — BP 130/50 | HR 74 | Ht 66.0 in | Wt 118.4 lb

## 2015-12-24 DIAGNOSIS — I471 Supraventricular tachycardia, unspecified: Secondary | ICD-10-CM

## 2015-12-24 DIAGNOSIS — I441 Atrioventricular block, second degree: Secondary | ICD-10-CM | POA: Diagnosis not present

## 2015-12-24 DIAGNOSIS — I711 Thoracic aortic aneurysm, ruptured, unspecified: Secondary | ICD-10-CM

## 2015-12-24 DIAGNOSIS — I10 Essential (primary) hypertension: Secondary | ICD-10-CM | POA: Diagnosis not present

## 2015-12-24 DIAGNOSIS — I341 Nonrheumatic mitral (valve) prolapse: Secondary | ICD-10-CM

## 2015-12-24 NOTE — Patient Instructions (Signed)
Your physician wants you to follow-up in: 6 months with Dr. Croitoru. You will receive a reminder letter in the mail two months in advance. If you don't receive a letter, please call our office to schedule the follow-up appointment.  

## 2015-12-24 NOTE — Progress Notes (Signed)
Patient ID: Yvonne [name], female   DOB: 06/13/1926, 82 y.o.   MRN: [id number]    Cardiology Office Note    Date:  12/25/2015   ID:  Yvonne [name], DOB April 28, 1926, MRN [id number]  PCP:  Lujean Amel, MD  Cardiologist:   Sanda Klein, MD   Chief Complaint  Patient presents with  . Follow-up    no chest pain, no shortness of breath, no edema, no pain or cramping in legs, no lightheaded or dizziness     History of Present Illness:  Yvonne [name] is a 82 y.o. female who presents for f/u for atrial tachycardia, syncope, ascending aortic aneurysm rupture/repair, mitral valve prolapse  She has no cardiovascular complaints. Her appetite remains mediocre, especially since she has had a lot of problems with pain due to repeated lumbar spine compression fractures. She has undergone 2 more kyphoplasty procedures since her last appointment and all 5 of her lumbar vertebrae have now been treated. She is underweight with a BMI of 19.   Yvonne [name] has a history of ruptured aneurysm of the ascending aorta complicated by pericardial tamponade in 2011, surgically repaired with resuspension of the native aortic valve by Dr. Servando Snare. She last saw Dr. Servando Snare in November 2016. Chest CT angiography showed a stable 5.5 cm aneurysm of the proximal aortic arch.   There is mild aortic valve regurgitation. Left ventricular systolic function is normal. Doppler pattern suggests diastolic dysfunction. She has mitral valve prolapse with hemodynamically insignificant mitral insufficiency.  She has a long-standing history of symptomatic atrial tachycardia. Atrial tachycardia was successfully ablated by Dr. Thompson Grayer, but she continued to have highly symptomatic PACs, that have responded very well to flecainide, just as the tachycardia did before. Beta blocker therapy has been gradually reduced and eventually completely discontinued when she developed symptomatic bradycardia due to second degree AV block,  Mobitz type I. She had syncope and the event monitor showed marked bradycardia due to heart block. Syncope has not recurred after discontinuation of beta blocker therapy.  She has osteoporosis and has required kyphoplasty for compression fractures, has treated hypothyroidism, gastroesophageal reflux disease and recurrent urinary tract infections. She reports that she was told to hold her losartan for her kyphoplasty procedures and that on each occasion her blood pressure was severely elevated. Otherwise blood pressure control has been excellent.    Past Medical History  Diagnosis Date  . Thoracic aortic aneurysm ([place]) 05/2010    Ruptured ascending  . History of atrial paroxysmal tachycardia   . History of hypothyroidism   . Compression fracture of L2 lumbar vertebra ([facility])   . Acute blood loss anemia   . Volume overload   . Hypertension   . Mitral valve prolapse   . Hypothyroidism   . Afib ([place])     -no   . History of blood transfusion   . Shortness of breath     with  exertion  . UTI (lower urinary tract infection)   . Arthritis   . Pericardial tamponade 05/2010  . Premature atrial contractions   . Syncope   . Heart murmur   . Moderate aortic insufficiency 05/25/2014  . Pneumonia     many years ago  . GERD (gastroesophageal reflux disease)     "mild"  . Cancer ([place])     skin cancer - arm squamous    Past Surgical History  Procedure Laterality Date  . Revision of left total hip replacement using some allograft  11/23/2010  . Classic atrioventricular nodal  reentrant  10/20/2010  . L2 kyphoplasty using kyphon system.  03/17/2009  . Replacement of ascending aorta and hemiarch and resuspension of the aortic valve with circulatory cardiopulmonary bypass and circulatory arrest  06/25/2010    Dr Servando Snare  . Ablation    . Tubal ligation  1966  . Tonsillectomy    . Appendectomy    . Hernia repair  1993    Right Inguinal  . Eye surgery Bilateral 2003    cataract bilateral  .  Toe osteotomy      Right 2nd   . Percutaneous pinning wrist fracture Bilateral 1995    bil  . Breast surgery Right     right biospy  . Kyphoplasty  09/05/2012    Procedure: KYPHOPLASTY;  Surgeon: Winfield Cunas, MD;  Location: [facility];  Service: Neurosurgery;  Laterality: N/A;  L1 kyphoplasty  . Colonoscopy    . Kyphoplasty N/A 04/01/2014    Procedure: KYPHOPLASTY;  Surgeon: Ashok Pall, MD;  Location: [facility];  Service: Neurosurgery;  Laterality: N/A;  Lumbar Four Kyphoplasty  . Kyphoplasty N/A 08/07/2015    Procedure: KYPHOPLASTY LUMBAR THREE;  Surgeon: Ashok Pall, MD;  Location: [place] [facility];  Service: Neurosurgery;  Laterality: N/A;  . Kyphoplasty N/A 12/09/2015    Procedure: Lumbar five, KYPHOPLASTY;  Surgeon: Ashok Pall, MD;  Location: [facility];  Service: Neurosurgery;  Laterality: N/A;  L5 kyphoplasty    Current Medications: Outpatient Prescriptions Prior to Visit  Medication Sig Dispense Refill  . amLODipine (NORVASC) 5 MG tablet Take 1 tablet (5 mg total) by mouth daily. KEEP OV. 90 tablet 0  . amoxicillin (AMOXIL) 500 MG tablet Take 4 tablets (2gram) by mouth one hour prior to dental work. 20 tablet 0  . aspirin 325 MG tablet Take 325 mg by mouth daily.    . calcium-vitamin D (OSCAL WITH D) 500-200 MG-UNIT per tablet Take 1 tablet by mouth daily at 12 noon.    . cholecalciferol (VITAMIN D) 1000 UNITS tablet Take 2,000 Units by mouth daily at 12 noon.     . flecainide (TAMBOCOR) 50 MG tablet Take 0.5 tablets (25 mg total) by mouth 2 (two) times daily. 90 tablet 1  . levothyroxine (SYNTHROID, LEVOTHROID) 50 MCG tablet Take 50 mcg by mouth every morning.     Marland Kitchen losartan (COZAAR) 50 MG tablet Take 1 tablet by mouth in  the morning and 1/2 tablet  in the evening 135 tablet 6  . traMADol (ULTRAM) 50 MG tablet Take 1 tablet (50 mg total) by mouth every 6 (six) hours as needed for moderate pain. 50 tablet 0  . ondansetron (ZOFRAN ODT) 4 MG disintegrating tablet Take 1  tablet (4 mg total) by mouth every 8 (eight) hours as needed for nausea or vomiting. (Patient not taking: Reported on 12/24/2015) 30 tablet 0  . oxyCODONE-acetaminophen (PERCOCET/ROXICET) 5-325 MG tablet Take 1 tablet by mouth daily as needed for moderate pain. Reported on 12/24/2015     No facility-administered medications prior to visit.     Allergies:   Review of patient's allergies indicates no known allergies.   Social History   Social History  . Marital Status: Widowed    Spouse Name: N/A  . Number of Children: N/A  . Years of Education: N/A   Social History Main Topics  . Smoking status: Never Smoker   . Smokeless tobacco: Never Used  . Alcohol Use: No  . Drug Use: No  . Sexual Activity: Not Asked  Other Topics Concern  . None   Social History Narrative     Family History:  The patient's  family history includes Heart failure in her mother; Hypertension in her brother, brother, sister, and sister; Stroke in her father and sister.   ROS:   Please see the history of present illness.    ROS All other systems reviewed and are negative.   PHYSICAL EXAM:   VS:  BP 130/50 mmHg  Pulse 74  Ht 5\' 6"  (1.676 m)  Wt 53.695 kg (118 lb 6 oz)  BMI 19.12 kg/m2   GEN: Very slender, well developed, in no acute distress HEENT: normal Neck: no JVD, carotid bruits, or masses Cardiac: RRR; 2/6 aortic ejection murmur, 1/6 aortic decrease in the diastolic murmur,, no rubs or gallops,no edema  Respiratory:  clear to auscultation bilaterally, normal work of breathing GI: soft, nontender, nondistended, + BS MS: no deformity or atrophy Skin: warm and dry, no rash Neuro:  Alert and Oriented x 3, Strength and sensation are intact Psych: euthymic mood, full affect  Wt Readings from Last 3 Encounters:  12/24/15 53.695 kg (118 lb 6 oz)  12/09/15 54.432 kg (120 lb)  08/07/15 53.978 kg (119 lb)      Studies/Labs Reviewed:   EKG:  EKG is ordered today.  The ekg ordered today  demonstrates Sinus rhythm with prominent sinus arrhythmia, left ventricular hypertrophy, QS pattern in leads V1-V2. The QRS measures 90 ms. QTC 451 ms. ECG is essentially unchanged.  Recent Labs: 07/10/2015: ALT 6; TSH 1.758 12/09/2015: BUN 22*; Creatinine, Ser 1.00; Hemoglobin 13.5; Platelets 164; Potassium 4.8; Sodium 137   Lipid Panel No results found for: CHOL, TRIG, HDL, CHOLHDL, VLDL, LDLCALC, LDLDIRECT  Additional studies/ records that were reviewed today include:  Notes from Dr. Servando Snare, CT of the aorta images, reports of surgical kyphoplasty procedures.    ASSESSMENT:    1. SVT (supraventricular tachycardia) ([place])   2. Second degree AV block   3. Ruptured aneurysm of thoracic aorta ([place])   4. Essential hypertension      PLAN:  In order of problems listed above:  1. SVT/PACs: No recurrence of sustained palpitations following her ablation, symptomatic PACs well controlled with flecainide. No evidence of mild toxicity by clinical exam or ECG. 2. 2nd deg (2:1) AV block: No recurrence of symptomatic bradycardia/syncope after discontinuation of beta blockers 3. Ao aneurysm: Stable moderate to large aneurysm of the proximal aortic arch, monitored by Dr. Servando Snare. 4. HTN: Well-controlled blood pressure. 5. MVP: Not hemodynamically significant; she probably has a connective tissue disorder that is responsible for the mitral valve and aortic abnormalities.    Medication Adjustments/Labs and Tests Ordered: Current medicines are reviewed at length with the patient today.  Concerns regarding medicines are outlined above.  Medication changes, Labs and Tests ordered today are listed in the Patient Instructions below. Patient Instructions  Your physician wants you to follow-up in: 6 months with Dr. Sallyanne Kuster. You will receive a reminder letter in the mail two months in advance. If you don't receive a letter, please call our office to schedule the follow-up appointment.      Mikael Spray, MD  12/25/2015 3:06 PM    [facility] [place], [place], [place]  [address] Phone: [phone]; Fax: [phone]

## 2015-12-28 ENCOUNTER — Ambulatory Visit (INDEPENDENT_AMBULATORY_CARE_PROVIDER_SITE_OTHER): Payer: Medicare Other | Admitting: Family Medicine

## 2015-12-28 ENCOUNTER — Encounter: Payer: Self-pay | Admitting: Family Medicine

## 2015-12-28 VITALS — BP 126/62 | HR 86 | Temp 98.0°F | Resp 16 | Ht 66.0 in | Wt 120.2 lb

## 2015-12-28 DIAGNOSIS — M810 Age-related osteoporosis without current pathological fracture: Secondary | ICD-10-CM | POA: Insufficient documentation

## 2015-12-28 DIAGNOSIS — I441 Atrioventricular block, second degree: Secondary | ICD-10-CM | POA: Diagnosis not present

## 2015-12-28 DIAGNOSIS — I10 Essential (primary) hypertension: Secondary | ICD-10-CM | POA: Diagnosis not present

## 2015-12-28 DIAGNOSIS — R1013 Epigastric pain: Secondary | ICD-10-CM

## 2015-12-28 LAB — CBC WITH DIFFERENTIAL/PLATELET
BASOS PCT: 0.6 % (ref 0.0–3.0)
Basophils Absolute: 0 10*3/uL (ref 0.0–0.1)
EOS PCT: 0.9 % (ref 0.0–5.0)
Eosinophils Absolute: 0.1 10*3/uL (ref 0.0–0.7)
HCT: 38.1 % (ref 36.0–46.0)
Hemoglobin: 12.8 g/dL (ref 12.0–15.0)
LYMPHS ABS: 1.3 10*3/uL (ref 0.7–4.0)
Lymphocytes Relative: 21.1 % (ref 12.0–46.0)
MCHC: 33.5 g/dL (ref 30.0–36.0)
MCV: 90.9 fl (ref 78.0–100.0)
MONO ABS: 0.4 10*3/uL (ref 0.1–1.0)
MONOS PCT: 6.5 % (ref 3.0–12.0)
NEUTROS ABS: 4.5 10*3/uL (ref 1.4–7.7)
NEUTROS PCT: 70.9 % (ref 43.0–77.0)
PLATELETS: 180 10*3/uL (ref 150.0–400.0)
RBC: 4.19 Mil/uL (ref 3.87–5.11)
RDW: 15 % (ref 11.5–15.5)
WBC: 6.3 10*3/uL (ref 4.0–10.5)

## 2015-12-28 LAB — LIPASE: LIPASE: 27 U/L (ref 11.0–59.0)

## 2015-12-28 LAB — BASIC METABOLIC PANEL
BUN: 26 mg/dL — ABNORMAL HIGH (ref 6–23)
CALCIUM: 9.5 mg/dL (ref 8.4–10.5)
CO2: 29 meq/L (ref 19–32)
CREATININE: 0.98 mg/dL (ref 0.40–1.20)
Chloride: 103 mEq/L (ref 96–112)
GFR: 56.99 mL/min — AB (ref >60.00)
Glucose, Bld: 100 mg/dL — ABNORMAL HIGH (ref 70–99)
Potassium: 4.8 mEq/L (ref 3.5–5.1)
SODIUM: 139 meq/L (ref 135–145)

## 2015-12-28 LAB — HEPATIC FUNCTION PANEL
ALK PHOS: 98 U/L (ref 39–117)
ALT: 6 U/L (ref 0–35)
AST: 14 U/L (ref 0–37)
Albumin: 4.1 g/dL (ref 3.5–5.2)
BILIRUBIN DIRECT: 0.3 mg/dL (ref 0.0–0.3)
TOTAL PROTEIN: 7.1 g/dL (ref 6.0–8.3)
Total Bilirubin: 1.3 mg/dL — ABNORMAL HIGH (ref 0.2–1.2)

## 2015-12-28 LAB — AMYLASE: Amylase: 89 U/L (ref 27–131)

## 2015-12-28 NOTE — Assessment & Plan Note (Signed)
New to provider, ongoing for pt.  She is 2.5 weeks out from her 5th kyphoplasty w/ Dr Christella Noa.  On calcium and Vit D daily.  Not currently on a bisphosphonate.  Seeing GYN.  Will follow along and assist as able

## 2015-12-28 NOTE — Assessment & Plan Note (Signed)
New.  Pt has hx of ruptured thoracic aortic aneurysm.  Clearing she has not ruptured after 24 hrs of pain but most r/o AAA- will get complete US.  She is also worried about pancreatic issues since her husband died of pancreatic cancer.  Check labs.  Pt to use tramadol as needed as her pain is already markedly better than yesterday.  Pain w/ certain movement leans toward a musculoskeletal dx but we must r/o the more serious issues first.  Reviewed supportive care and red flags that should prompt return.  Pt expressed understanding and is in agreement w/ plan.

## 2015-12-28 NOTE — Patient Instructions (Signed)
We'll notify you of your lab results and ultrasound results If you develop worsening pain- please go to the ER Use the Tramadol as needed for pain Call with any questions or concerns Hang in there!!!

## 2015-12-28 NOTE — Progress Notes (Signed)
Pre visit review using our clinic review tool, if applicable. No additional management support is needed unless otherwise documented below in the visit note. 

## 2015-12-28 NOTE — Progress Notes (Signed)
   Subjective:    Patient ID: Yvonne [name], female    DOB: 11-Sep-1925, 82 y.o.   MRN: [id number]  HPI New to establish.  Previous MD- [place]- Croitoru  Neurosurg- [place]  abd pain- pt had sharp epigastric pain, at end of sternum, prior to church yesterday.  Pain worsened w/ deep breath, certain movements.  Pt reports sharp pain improved and area is now 'just sore'.  No fevers.  No N/V.  Able to eat w/o difficulty.  No diarrhea.  No changes in activity level.  Continues to be painful to take a deep breath.  Denies SOB.  No change in diet.  No change in medication.  Pain radiates straight through into back.  SVT/heart block/Afib- not currently on blood thinners, taking ASA 325mg  daily.  On Flecainide.  Afib occurred post-op.  Following w/ Cards.  HTN- chronic problem, on Amlodipine, Losartan.  Denies CP, SOB, HAs, visual changes, edema  Osteoporosis- chronic problem, multiple compression fxs of spine.  Just had most recent kyphoplasty 2.5 weeks ago w/ Dr Christella Noa.   Review of Systems For ROS see HPI     Objective:   Physical Exam  Constitutional: She is oriented to person, place, and time. No distress.  Frail, elderly woman  HENT:  Head: Normocephalic and atraumatic.  Eyes: Conjunctivae and EOM are normal. Pupils are equal, round, and reactive to light.  Neck: Normal range of motion. Neck supple. No thyromegaly present.  Cardiovascular: Normal rate, regular rhythm, normal heart sounds and intact distal pulses.   No murmur heard. Pulmonary/Chest: Effort normal and breath sounds normal. No respiratory distress. She exhibits tenderness (TTP over base of sternum).  Abdominal: Soft. She exhibits no distension. There is tenderness (TTP over epigastrum). There is no rebound and no guarding.  Musculoskeletal: She exhibits no edema.  Lymphadenopathy:    She has no cervical adenopathy.  Neurological: She is alert and oriented to person, place, and time.  Skin:  Skin is warm and dry.  Psychiatric: She has a normal mood and affect. Her behavior is normal.  Vitals reviewed.         Assessment & Plan:

## 2015-12-28 NOTE — Assessment & Plan Note (Signed)
Chronic problem.  Following w/ Dr Sallyanne Kuster.  Currently asymptomatic.  Will follow along.

## 2015-12-28 NOTE — Assessment & Plan Note (Signed)
New to provider, ongoing for pt.  Currently asymptomatic.  Well controlled.  Will follow and adjust meds prn.

## 2015-12-29 ENCOUNTER — Ambulatory Visit
Admission: RE | Admit: 2015-12-29 | Discharge: 2015-12-29 | Disposition: A | Discharge status: Home / Self Care | Payer: Medicare Other | Source: Ambulatory Visit | Attending: Family Medicine | Admitting: Family Medicine

## 2015-12-29 DIAGNOSIS — R1013 Epigastric pain: Secondary | ICD-10-CM

## 2015-12-30 ENCOUNTER — Ambulatory Visit (HOSPITAL_COMMUNITY)
Admission: RE | Admit: 2015-12-30 | Discharge: 2015-12-30 | Disposition: A | Discharge status: Home / Self Care | Payer: Medicare Other | Source: Ambulatory Visit | Attending: Family Medicine | Admitting: Family Medicine

## 2015-12-30 ENCOUNTER — Other Ambulatory Visit (HOSPITAL_COMMUNITY): Payer: Medicare Other

## 2015-12-30 ENCOUNTER — Telehealth: Payer: Self-pay | Admitting: General Practice

## 2015-12-30 ENCOUNTER — Encounter (HOSPITAL_COMMUNITY): Payer: Self-pay

## 2015-12-30 DIAGNOSIS — K838 Other specified diseases of biliary tract: Secondary | ICD-10-CM | POA: Diagnosis not present

## 2015-12-30 DIAGNOSIS — I714 Abdominal aortic aneurysm, without rupture, unspecified: Secondary | ICD-10-CM

## 2015-12-30 DIAGNOSIS — K8689 Other specified diseases of pancreas: Secondary | ICD-10-CM

## 2015-12-30 DIAGNOSIS — R590 Localized enlarged lymph nodes: Secondary | ICD-10-CM | POA: Diagnosis not present

## 2015-12-30 DIAGNOSIS — K869 Disease of pancreas, unspecified: Secondary | ICD-10-CM | POA: Diagnosis present

## 2015-12-30 DIAGNOSIS — K8020 Calculus of gallbladder without cholecystitis without obstruction: Secondary | ICD-10-CM | POA: Insufficient documentation

## 2015-12-30 MED ORDER — IOPAMIDOL (ISOVUE-300) INJECTION 61%
INTRAVENOUS | Status: AC
  Filled 2015-12-30: qty 100

## 2015-12-30 NOTE — Telephone Encounter (Signed)
Pt daughter made aware of lab results and wants to be called to schedule appt.

## 2015-12-31 ENCOUNTER — Telehealth: Payer: Self-pay | Admitting: General Practice

## 2015-12-31 ENCOUNTER — Other Ambulatory Visit: Payer: Self-pay | Admitting: Family Medicine

## 2015-12-31 DIAGNOSIS — Q453 Other congenital malformations of pancreas and pancreatic duct: Secondary | ICD-10-CM

## 2015-12-31 MED ORDER — IOPAMIDOL (ISOVUE-300) INJECTION 61%
100.0000 mL | Freq: Once | INTRAVENOUS | Status: AC | PRN
  Administered 2015-12-30: 100 mL via INTRAVENOUS

## 2015-12-31 MED ORDER — DIAZEPAM 2 MG PO TABS: ORAL_TABLET | ORAL | Status: DC

## 2015-12-31 NOTE — Telephone Encounter (Signed)
Ok for Valium 2mg  prior to MRI, disp #5 and as needed for anxiety

## 2015-12-31 NOTE — Telephone Encounter (Signed)
Medication filled to pharmacy as requested.   

## 2015-12-31 NOTE — Telephone Encounter (Signed)
Pt called and advised that she gets claustrophobic and is concerned about having her MRI completed. She was wondering if we might be able to send in some valium for her?

## 2016-01-05 ENCOUNTER — Other Ambulatory Visit: Payer: Self-pay | Admitting: Family Medicine

## 2016-01-05 ENCOUNTER — Telehealth: Payer: Self-pay | Admitting: Gastroenterology

## 2016-01-05 ENCOUNTER — Ambulatory Visit
Admission: RE | Admit: 2016-01-05 | Discharge: 2016-01-05 | Disposition: A | Discharge status: Home / Self Care | Payer: Medicare Other | Source: Ambulatory Visit | Attending: Family Medicine | Admitting: Family Medicine

## 2016-01-05 DIAGNOSIS — Q453 Other congenital malformations of pancreas and pancreatic duct: Secondary | ICD-10-CM

## 2016-01-05 MED ORDER — GADOBENATE DIMEGLUMINE 529 MG/ML IV SOLN
10.0000 mL | Freq: Once | INTRAVENOUS | Status: AC | PRN
  Administered 2016-01-05: 10 mL via INTRAVENOUS

## 2016-01-05 NOTE — Telephone Encounter (Signed)
I can see her this Thursday at 4:15 after I finish procedures in the [facility] if office space/staff available

## 2016-01-05 NOTE — Telephone Encounter (Signed)
Dr. Loletha Carrow is listed as the doc of the day.

## 2016-01-05 NOTE — Telephone Encounter (Signed)
5/9 [place] OF DAY Urgent referral placed - Pancreatic ductal abnormality.  Please advise on scheduling.

## 2016-01-06 NOTE — Telephone Encounter (Signed)
Barb Merino consulted and appt spot opened pt has been notified and will arrive at 4 pm for a 415 appt

## 2016-01-06 NOTE — Telephone Encounter (Signed)
Patty this is from yesterday.

## 2016-01-07 ENCOUNTER — Other Ambulatory Visit: Payer: Self-pay | Admitting: Family Medicine

## 2016-01-07 ENCOUNTER — Encounter: Payer: Self-pay | Admitting: Gastroenterology

## 2016-01-07 ENCOUNTER — Ambulatory Visit (INDEPENDENT_AMBULATORY_CARE_PROVIDER_SITE_OTHER)
Admission: RE | Admit: 2016-01-07 | Discharge: 2016-01-07 | Disposition: A | Discharge status: Home / Self Care | Payer: Medicare Other | Source: Ambulatory Visit | Attending: Family Medicine | Admitting: Family Medicine

## 2016-01-07 ENCOUNTER — Ambulatory Visit (INDEPENDENT_AMBULATORY_CARE_PROVIDER_SITE_OTHER): Payer: Medicare Other | Admitting: Gastroenterology

## 2016-01-07 VITALS — BP 148/62 | HR 77 | Wt 114.4 lb

## 2016-01-07 DIAGNOSIS — K8020 Calculus of gallbladder without cholecystitis without obstruction: Secondary | ICD-10-CM | POA: Diagnosis not present

## 2016-01-07 DIAGNOSIS — M810 Age-related osteoporosis without current pathological fracture: Secondary | ICD-10-CM

## 2016-01-07 DIAGNOSIS — R932 Abnormal findings on diagnostic imaging of liver and biliary tract: Secondary | ICD-10-CM

## 2016-01-07 DIAGNOSIS — R0789 Other chest pain: Secondary | ICD-10-CM

## 2016-01-07 NOTE — Progress Notes (Signed)
[facility] Consult Note:  History: Yvonne [name] 01/07/2016  Referring physician: Annye Asa, MD  Reason for consult/chief complaint: abnormal gi xrays and Nausea   Subjective HPI:  This 82 year old woman was referred urgently to [place] for abdominal pain and abnormal imaging findings of the pancreas and bile duct. She is coming by her daughter today, who corroborates the history. Yvonne [name] states that 10 days ago she had the rather acute onset of some left anterior lower chest wall pain radiating around to the back. It was definitely worse with deep breath and certain movements. She saw her PCP the following day, who prescribed oxycodone has only helped minimally. Due to the severity of the pain Dr. Birdie Riddle recommended the patient go to the ED but she apparently did not wish to do so. The pain has been almost unrelenting since then, only going away when she gets some sleep from the pain medicine. Due to these symptoms she underwent an ultrasound that led to a CT of the pancreas that led to an MRI of the abdomen as noted below. Essentially, she is found to have gallstones, dilatation of the distal CBD to 1.3 cm, and some microcystic changes in the pancreatic head with dilatation of the proximal pancreatic duct. The patient denies chronic diarrhea or weight loss. She has never been a smoker or and has never used alcohol regularly. She is very concerned because her husband died from pancreatic cancer.  Of note, the patient underwent a kyphoplasty about 3 weeks ago because she has had repeated compression fractures of the thoracic spine. ROS:  Review of Systems  Constitutional: Negative for appetite change and unexpected weight change.  HENT: Negative for mouth sores and voice change.   Eyes: Negative for pain and redness.  Respiratory: Negative for cough and shortness of breath.   Cardiovascular: Negative for chest pain and palpitations.  Gastrointestinal: Positive for nausea.   Genitourinary: Negative for dysuria and hematuria.  Musculoskeletal: Positive for back pain. Negative for myalgias and arthralgias.  Skin: Negative for pallor and rash.  Neurological: Negative for weakness and headaches.  Hematological: Negative for adenopathy.     Past Medical History: Past Medical History  Diagnosis Date  . Thoracic aortic aneurysm ([place]) 05/2010    Ruptured ascending  . History of atrial paroxysmal tachycardia   . History of hypothyroidism   . Compression fracture of L2 lumbar vertebra ([facility])   . Acute blood loss anemia   . Volume overload   . Hypertension   . Mitral valve prolapse   . Hypothyroidism   . Afib ([place])     -no   . History of blood transfusion   . Shortness of breath     with  exertion  . UTI (lower urinary tract infection)   . Arthritis   . Pericardial tamponade 05/2010  . Premature atrial contractions   . Syncope   . Heart murmur   . Moderate aortic insufficiency 05/25/2014  . Pneumonia     many years ago  . GERD (gastroesophageal reflux disease)     "mild"  . Cancer ([place])     skin cancer - arm squamous  . Osteoporosis      Past Surgical History: Past Surgical History  Procedure Laterality Date  . Revision of left total hip replacement using some allograft  11/23/2010  . Classic atrioventricular nodal reentrant  10/20/2010  . L2 kyphoplasty using kyphon system.  03/17/2009  . Replacement of ascending aorta and hemiarch and resuspension of the aortic valve  with circulatory cardiopulmonary bypass and circulatory arrest  06/25/2010    Dr Servando Snare  . Ablation    . Tubal ligation  1966  . Tonsillectomy    . Appendectomy    . Hernia repair  1993    Right Inguinal  . Eye surgery Bilateral 2003    cataract bilateral  . Toe osteotomy      Right 2nd   . Percutaneous pinning wrist fracture Bilateral 1995    bil  . Breast surgery Right     right biospy  . Kyphoplasty  09/05/2012    Procedure: KYPHOPLASTY;  Surgeon: Winfield Cunas,  MD;  Location: [facility];  Service: Neurosurgery;  Laterality: N/A;  L1 kyphoplasty  . Colonoscopy    . Kyphoplasty N/A 04/01/2014    Procedure: KYPHOPLASTY;  Surgeon: Ashok Pall, MD;  Location: [facility];  Service: Neurosurgery;  Laterality: N/A;  Lumbar Four Kyphoplasty  . Kyphoplasty N/A 08/07/2015    Procedure: KYPHOPLASTY LUMBAR THREE;  Surgeon: Ashok Pall, MD;  Location: [facility];  Service: Neurosurgery;  Laterality: N/A;  . Kyphoplasty N/A 12/09/2015    Procedure: Lumbar five, KYPHOPLASTY;  Surgeon: Ashok Pall, MD;  Location: [facility];  Service: Neurosurgery;  Laterality: N/A;  L5 kyphoplasty     Family History: Family History  Problem Relation Age of Onset  . Heart failure Mother   . Stroke Father   . Hypertension Brother   . Hypertension Brother   . Hypertension Sister   . Hypertension Sister   . Stroke Sister   . Stomach cancer      nephew  . Breast cancer      neice    Social History: Social History   Social History  . Marital Status: Widowed    Spouse Name: N/A  . Number of Children: N/A  . Years of Education: N/A   Social History Main Topics  . Smoking status: Never Smoker   . Smokeless tobacco: Never Used  . Alcohol Use: No  . Drug Use: No  . Sexual Activity: Not Asked   Other Topics Concern  . None   Social History Narrative    Allergies: No Known Allergies  Outpatient Meds: Current Outpatient Prescriptions  Medication Sig Dispense Refill  . amLODipine (NORVASC) 5 MG tablet Take 1 tablet (5 mg total) by mouth daily. KEEP OV. 90 tablet 0  . amoxicillin (AMOXIL) 500 MG tablet Take 4 tablets (2gram) by mouth one hour prior to dental work. 20 tablet 0  . aspirin 325 MG tablet Take 325 mg by mouth daily.    . calcium-vitamin D (OSCAL WITH D) 500-200 MG-UNIT per tablet Take 1 tablet by mouth daily at 12 noon.    . cholecalciferol (VITAMIN D) 1000 UNITS tablet Take 2,000 Units by mouth daily at 12 noon.     . diazepam (VALIUM) 2 MG  tablet Take 1 tablet before MRI, and use as needed for anxiety. 5 tablet 0  . docusate sodium (COLACE) 100 MG capsule Take 100 mg by mouth 2 (two) times daily.    . flecainide (TAMBOCOR) 50 MG tablet Take 0.5 tablets (25 mg total) by mouth 2 (two) times daily. 90 tablet 1  . levothyroxine (SYNTHROID, LEVOTHROID) 50 MCG tablet Take 50 mcg by mouth every morning.     Marland Kitchen losartan (COZAAR) 50 MG tablet Take 1 tablet by mouth in  the morning and 1/2 tablet  in the evening 135 tablet 6  . oxyCODONE-acetaminophen (PERCOCET/ROXICET) 5-325 MG tablet  Take by mouth every 4 (four) hours as needed for severe pain (every 4-6 hours as needed).    . traMADol (ULTRAM) 50 MG tablet Take 1 tablet (50 mg total) by mouth every 6 (six) hours as needed for moderate pain. 50 tablet 0   No current facility-administered medications for this visit.      ___________________________________________________________________ Objective  Exam:  BP 148/62 mmHg  Pulse 77  Ht   Wt 114 lb 6 oz (51.88 kg)   General: this is a(n) Feeble elderly woman who is in a lot of pain standing up and moving to the exam table. She has point tenderness over the mid thoracic spine and around the left posterior rib cage, but more so over the left inferior anterior rib cage.  Eyes: sclera anicteric, no redness  ENT: oral mucosa moist without lesions, no cervical or supraclavicular lymphadenopathy, good dentition  CV: RRR without murmur, S1/S2, no JVD, no peripheral edema  Resp: clear to auscultation bilaterally, normal RR and effort noted  GI: soft, no abdominal tenderness, with active bowel sounds. No guarding or palpable organomegaly noted.  Skin; warm and dry, no rash or jaundice noted  Neuro: awake, alert and oriented x 3. Normal gross motor function and fluent speech  Labs: CMP Latest Ref Rng 12/28/2015 12/09/2015 08/07/2015  Glucose 70 - 99 mg/dL 100(H) 98 89  BUN 6 - 23 mg/dL 26(H) 22(H) 21(H)  Creatinine 0.40 - 1.20 mg/dL  0.98 1.00 1.06(H)  Sodium 135 - 145 mEq/L 139 137 136  Potassium 3.5 - 5.1 mEq/L 4.8 4.8 4.8  Chloride 96 - 112 mEq/L 103 101 103  CO2 19 - 32 mEq/L 29 24 23   Calcium 8.4 - 10.5 mg/dL 9.5 9.5 9.5  Total Protein 6.0 - 8.3 g/dL 7.1 - -  Total Bilirubin 0.2 - 1.2 mg/dL 1.3(H) - -  Alkaline Phos 39 - 117 U/L 98 - -  AST 0 - 37 U/L 14 - -  ALT 0 - 35 U/L 6 - -      Radiologic Studies:  U/S - PD dilatation, ? Mass next to panc head. + Gallstones  CT of the abdomen also noted gallstones as well as dilatation of the distal CBD.  MRI Abdomen: Exam detail is significantly diminished secondary to motion artifact. 2. There is abnormal dilatation of the common bile duct (7mm), intrahepatic ducts and pancreatic duct. No obstructing mass or evidence of choledocholithiasis noted 3. Gallstones. 4. Diffuse atrophy of the pancreatic parenchyma. Although no enhancing mass is identified exam detail is significantly diminished secondary to motion artifact. The patient may benefit from a followup pancreas protocol CT. 5. Aortic atherosclerosis. 6. Left kidney renal sinus cysts  Assessment: Encounter Diagnoses  Name Primary?  . Left-sided chest wall pain Yes  . Abnormal findings on imaging of biliary tract   . Gallstones     This patient appears to have musculoskeletal pain that is unrelated to her imaging findings. I suspect the appearance of the distal pancreas is from microcystic changes causing architectural distortion of the PD and perhaps the distal CBD but with normal LFTs. This microcystic appearance of the pancreatic head is most likely benign, but probably needs an EUS when her more acute pain issues are resolved.  Plan:  I spoke with Dr. Birdie Riddle on the phone today, and she ordered some rib x-rays and make some adjustments in and is pain medicine. If she ever gets no relief with that, she might proceed to the ED for IV pain  control We have asked her daughter to follow up with [place]  by phone in 2 weeks to see as she is doing and we can make a decision about EUS. I will also giving some time to review the images with my partner Dr. Ardis Hughs, who performs the procedure, and see if he agrees that it is indicated. Thank you for the courtesy of this consult.  Please call me with any questions or concerns.  Nelida Meuse III  CC: Annye Asa, MD

## 2016-01-07 NOTE — Patient Instructions (Addendum)
If you are age 80 or older, your body mass index should be between 23-30. Your Body mass index is 18.47 kg/(m^2). If this is out of the aforementioned range listed, please consider follow up with your Primary Care Provider.  If you are age 35 or younger, your body mass index should be between 19-25. Your Body mass index is 18.47 kg/(m^2). If this is out of the aformentioned range listed, please consider follow up with your Primary Care Provider.   Please contact us in 2 weeks and let us know how you are doing with this pain.  Then we can discuss further testing of the pancreas as discussed at the office today.   Thank you for choosing [facility]  Dr Wilfrid Lund III

## 2016-01-07 NOTE — Progress Notes (Unsigned)
Received a call from Dr Loletha Carrow (GI) indicating that pt is in severe pain in his office but the findings on CT/MRI are chronic and her pain appears to be more musculoskeletal in nature.  Given her severe pain that is not relieved by narcotics and her hx of osteoporosis, we will get xrays of ribs and thoracic spine to r/o new fx (hx of compression fx).  Dr Loletha Carrow will have pt and daughter go downstairs at [facility] to get imaging done today.  We will contact pt with her results and discuss pain control once results available.

## 2016-01-11 ENCOUNTER — Other Ambulatory Visit: Payer: Self-pay | Admitting: Neurosurgery

## 2016-01-12 ENCOUNTER — Encounter (HOSPITAL_COMMUNITY): Payer: Self-pay | Admitting: *Deleted

## 2016-01-12 NOTE — Progress Notes (Signed)
Pt sees Dr. Sallyanne Kuster for atrial tachycardia, syncope, ascending aortic aneurysm rupture/repair, mitral valve prolapse, last office visit was 12/24/15. Pt denies any recent chest pain or sob.

## 2016-01-13 ENCOUNTER — Observation Stay (HOSPITAL_COMMUNITY)
Admission: RE | Admit: 2016-01-13 | Discharge: 2016-01-14 | Disposition: A | Discharge status: Home / Self Care | Payer: Medicare Other | Source: Ambulatory Visit | Attending: Neurosurgery | Admitting: Neurosurgery

## 2016-01-13 ENCOUNTER — Ambulatory Visit (HOSPITAL_COMMUNITY): Payer: Medicare Other | Admitting: Anesthesiology

## 2016-01-13 ENCOUNTER — Encounter (HOSPITAL_COMMUNITY)
Admission: RE | Disposition: A | Discharge status: Home / Self Care | Payer: Self-pay | Source: Ambulatory Visit | Attending: Neurosurgery

## 2016-01-13 ENCOUNTER — Encounter (HOSPITAL_COMMUNITY): Payer: Self-pay | Admitting: Anesthesiology

## 2016-01-13 ENCOUNTER — Ambulatory Visit (HOSPITAL_COMMUNITY): Payer: Medicare Other

## 2016-01-13 DIAGNOSIS — Z7982 Long term (current) use of aspirin: Secondary | ICD-10-CM | POA: Insufficient documentation

## 2016-01-13 DIAGNOSIS — I4891 Unspecified atrial fibrillation: Secondary | ICD-10-CM | POA: Insufficient documentation

## 2016-01-13 DIAGNOSIS — Z419 Encounter for procedure for purposes other than remedying health state, unspecified: Secondary | ICD-10-CM

## 2016-01-13 DIAGNOSIS — E039 Hypothyroidism, unspecified: Secondary | ICD-10-CM | POA: Insufficient documentation

## 2016-01-13 DIAGNOSIS — I10 Essential (primary) hypertension: Secondary | ICD-10-CM | POA: Diagnosis not present

## 2016-01-13 DIAGNOSIS — S22060A Wedge compression fracture of T7-T8 vertebra, initial encounter for closed fracture: Secondary | ICD-10-CM | POA: Diagnosis present

## 2016-01-13 DIAGNOSIS — Z96649 Presence of unspecified artificial hip joint: Secondary | ICD-10-CM | POA: Diagnosis not present

## 2016-01-13 DIAGNOSIS — M4854XA Collapsed vertebra, not elsewhere classified, thoracic region, initial encounter for fracture: Secondary | ICD-10-CM | POA: Diagnosis present

## 2016-01-13 HISTORY — PX: KYPHOPLASTY: SHX5884

## 2016-01-13 LAB — BASIC METABOLIC PANEL
Anion gap: 11 (ref 5–15)
BUN: 17 mg/dL (ref 6–20)
CO2: 25 mmol/L (ref 22–32)
Calcium: 9.7 mg/dL (ref 8.9–10.3)
Chloride: 101 mmol/L (ref 101–111)
Creatinine, Ser: 1.09 mg/dL — ABNORMAL HIGH (ref 0.44–1.00)
GFR calc Af Amer: 51 mL/min — ABNORMAL LOW (ref >60)
GFR, EST NON AFRICAN AMERICAN: 44 mL/min — AB (ref >60)
GLUCOSE: 101 mg/dL — AB (ref 65–99)
POTASSIUM: 4.6 mmol/L (ref 3.5–5.1)
Sodium: 137 mmol/L (ref 135–145)

## 2016-01-13 LAB — CBC
HEMATOCRIT: 43 % (ref 36.0–46.0)
Hemoglobin: 14.3 g/dL (ref 12.0–15.0)
MCH: 31.2 pg (ref 26.0–34.0)
MCHC: 33.3 g/dL (ref 30.0–36.0)
MCV: 93.7 fL (ref 78.0–100.0)
Platelets: 196 10*3/uL (ref 150–400)
RBC: 4.59 MIL/uL (ref 3.87–5.11)
RDW: 14.5 % (ref 11.5–15.5)
WBC: 6 10*3/uL (ref 4.0–10.5)

## 2016-01-13 LAB — SURGICAL PCR SCREEN
MRSA, PCR: NEGATIVE
STAPHYLOCOCCUS AUREUS: NEGATIVE

## 2016-01-13 SURGERY — KYPHOPLASTY
Anesthesia: General | Site: Back

## 2016-01-13 MED ORDER — FENTANYL CITRATE (PF) 100 MCG/2ML IJ SOLN
INTRAMUSCULAR | Status: DC | PRN
  Administered 2016-01-13: 150 ug via INTRAVENOUS

## 2016-01-13 MED ORDER — AMLODIPINE BESYLATE 5 MG PO TABS
5.0000 mg | ORAL_TABLET | Freq: Every day | ORAL | Status: DC
  Administered 2016-01-14: 5 mg via ORAL
  Filled 2016-01-13: qty 1

## 2016-01-13 MED ORDER — 0.9 % SODIUM CHLORIDE (POUR BTL) OPTIME
TOPICAL | Status: DC | PRN
  Administered 2016-01-13: 1000 mL

## 2016-01-13 MED ORDER — MUPIROCIN 2 % EX OINT
1.0000 "application " | TOPICAL_OINTMENT | Freq: Once | CUTANEOUS | Status: AC
  Administered 2016-01-13: 1 via TOPICAL

## 2016-01-13 MED ORDER — FLECAINIDE ACETATE 50 MG PO TABS
25.0000 mg | ORAL_TABLET | Freq: Two times a day (BID) | ORAL | Status: DC
  Administered 2016-01-13: 25 mg via ORAL
  Filled 2016-01-13 (×2): qty 1

## 2016-01-13 MED ORDER — SUGAMMADEX SODIUM 200 MG/2ML IV SOLN
INTRAVENOUS | Status: AC
  Filled 2016-01-13: qty 2

## 2016-01-13 MED ORDER — IOPAMIDOL (ISOVUE-300) INJECTION 61%
INTRAVENOUS | Status: DC | PRN
  Administered 2016-01-13: 50 mL

## 2016-01-13 MED ORDER — ONDANSETRON HCL 4 MG/2ML IJ SOLN
INTRAMUSCULAR | Status: DC | PRN
  Administered 2016-01-13: 4 mg via INTRAVENOUS

## 2016-01-13 MED ORDER — LIDOCAINE HCL (CARDIAC) 20 MG/ML IV SOLN
INTRAVENOUS | Status: DC | PRN
  Administered 2016-01-13: 40 mg via INTRAVENOUS

## 2016-01-13 MED ORDER — ROCURONIUM BROMIDE 50 MG/5ML IV SOLN
INTRAVENOUS | Status: AC
  Filled 2016-01-13: qty 1

## 2016-01-13 MED ORDER — LACTATED RINGERS IV SOLN
INTRAVENOUS | Status: DC | PRN
  Administered 2016-01-13 (×2): via INTRAVENOUS

## 2016-01-13 MED ORDER — PROPOFOL 10 MG/ML IV BOLUS
INTRAVENOUS | Status: AC
  Filled 2016-01-13: qty 20

## 2016-01-13 MED ORDER — CEFAZOLIN SODIUM-DEXTROSE 2-4 GM/100ML-% IV SOLN
INTRAVENOUS | Status: AC
  Administered 2016-01-13: 2 g via INTRAVENOUS
  Filled 2016-01-13: qty 100

## 2016-01-13 MED ORDER — LOSARTAN POTASSIUM 50 MG PO TABS
50.0000 mg | ORAL_TABLET | Freq: Every day | ORAL | Status: DC
  Administered 2016-01-13 – 2016-01-14 (×2): 50 mg via ORAL
  Filled 2016-01-13 (×2): qty 1

## 2016-01-13 MED ORDER — CLONIDINE HCL 0.1 MG PO TABS
0.1000 mg | ORAL_TABLET | Freq: Once | ORAL | Status: AC
  Administered 2016-01-13: 0.1 mg via ORAL
  Filled 2016-01-13: qty 1

## 2016-01-13 MED ORDER — FENTANYL CITRATE (PF) 100 MCG/2ML IJ SOLN: 25.0000 ug | INTRAMUSCULAR | Status: DC | PRN

## 2016-01-13 MED ORDER — DIAZEPAM 2 MG PO TABS: 2.0000 mg | ORAL_TABLET | Freq: Every day | ORAL | Status: DC | PRN

## 2016-01-13 MED ORDER — HYDROMORPHONE HCL 1 MG/ML IJ SOLN
INTRAMUSCULAR | Status: AC
  Administered 2016-01-13: 0.5 mg via INTRAVENOUS
  Filled 2016-01-13: qty 1

## 2016-01-13 MED ORDER — ONDANSETRON HCL 4 MG/2ML IJ SOLN: 4.0000 mg | Freq: Once | INTRAMUSCULAR | Status: DC | PRN

## 2016-01-13 MED ORDER — MUPIROCIN 2 % EX OINT
TOPICAL_OINTMENT | CUTANEOUS | Status: AC
  Administered 2016-01-13: 1 via TOPICAL
  Filled 2016-01-13: qty 22

## 2016-01-13 MED ORDER — OXYCODONE-ACETAMINOPHEN 5-325 MG PO TABS: 1.0000 | ORAL_TABLET | ORAL | Status: DC | PRN

## 2016-01-13 MED ORDER — PROPOFOL 10 MG/ML IV BOLUS
INTRAVENOUS | Status: DC | PRN
  Administered 2016-01-13: 100 mg via INTRAVENOUS

## 2016-01-13 MED ORDER — ROCURONIUM BROMIDE 100 MG/10ML IV SOLN
INTRAVENOUS | Status: DC | PRN
  Administered 2016-01-13: 30 mg via INTRAVENOUS
  Administered 2016-01-13: 4 mg via INTRAVENOUS

## 2016-01-13 MED ORDER — LABETALOL HCL 5 MG/ML IV SOLN
INTRAVENOUS | Status: AC
  Administered 2016-01-13: 0.5 mg via INTRAVENOUS
  Filled 2016-01-13: qty 4

## 2016-01-13 MED ORDER — LACTATED RINGERS IV SOLN
INTRAVENOUS | Status: DC
  Administered 2016-01-13: 14:00:00 via INTRAVENOUS

## 2016-01-13 MED ORDER — POTASSIUM CHLORIDE IN NACL 20-0.9 MEQ/L-% IV SOLN
INTRAVENOUS | Status: DC
  Filled 2016-01-13 (×2): qty 1000

## 2016-01-13 MED ORDER — LIDOCAINE-EPINEPHRINE 0.5 %-1:200000 IJ SOLN
INTRAMUSCULAR | Status: DC | PRN
  Administered 2016-01-13: 7 mL

## 2016-01-13 MED ORDER — LABETALOL HCL 5 MG/ML IV SOLN
INTRAVENOUS | Status: DC | PRN
  Administered 2016-01-13 (×2): 5 mg via INTRAVENOUS

## 2016-01-13 MED ORDER — LABETALOL HCL 5 MG/ML IV SOLN
2.5000 mg | Freq: Once | INTRAVENOUS | Status: AC
  Administered 2016-01-13: 0.5 mg via INTRAVENOUS

## 2016-01-13 MED ORDER — MEPERIDINE HCL 25 MG/ML IJ SOLN: 6.2500 mg | INTRAMUSCULAR | Status: DC | PRN

## 2016-01-13 MED ORDER — LEVOTHYROXINE SODIUM 100 MCG PO TABS
50.0000 ug | ORAL_TABLET | Freq: Every morning | ORAL | Status: DC
  Administered 2016-01-14: 50 ug via ORAL
  Filled 2016-01-13: qty 1

## 2016-01-13 MED ORDER — DOCUSATE SODIUM 100 MG PO CAPS
100.0000 mg | ORAL_CAPSULE | Freq: Two times a day (BID) | ORAL | Status: DC | PRN
Start: 2016-01-13 — End: 2016-01-14

## 2016-01-13 MED ORDER — TRAMADOL HCL 50 MG PO TABS
100.0000 mg | ORAL_TABLET | Freq: Two times a day (BID) | ORAL | Status: DC | PRN
  Administered 2016-01-14: 100 mg via ORAL
  Filled 2016-01-13: qty 2

## 2016-01-13 MED ORDER — EPHEDRINE SULFATE 50 MG/ML IJ SOLN
INTRAMUSCULAR | Status: DC | PRN
  Administered 2016-01-13: 2.5 mg via INTRAVENOUS

## 2016-01-13 MED ORDER — HYDROMORPHONE HCL 1 MG/ML IJ SOLN
0.5000 mg | Freq: Once | INTRAMUSCULAR | Status: AC
  Administered 2016-01-13: 0.5 mg via INTRAVENOUS

## 2016-01-13 MED ORDER — FENTANYL CITRATE (PF) 250 MCG/5ML IJ SOLN
INTRAMUSCULAR | Status: AC
  Filled 2016-01-13: qty 5

## 2016-01-13 MED ORDER — PHENYLEPHRINE HCL 10 MG/ML IJ SOLN
10.0000 mg | INTRAVENOUS | Status: DC | PRN
  Administered 2016-01-13: 40 ug/min via INTRAVENOUS

## 2016-01-13 MED ORDER — SUGAMMADEX SODIUM 200 MG/2ML IV SOLN
INTRAVENOUS | Status: DC | PRN
  Administered 2016-01-13: 100 mg via INTRAVENOUS

## 2016-01-13 MED ORDER — VITAMIN D 1000 UNITS PO TABS: 2000.0000 [IU] | ORAL_TABLET | Freq: Every day | ORAL | Status: DC

## 2016-01-13 MED ORDER — LIDOCAINE 2% (20 MG/ML) 5 ML SYRINGE
INTRAMUSCULAR | Status: AC
  Filled 2016-01-13: qty 5

## 2016-01-13 SURGICAL SUPPLY — 40 items
BLADE CLIPPER SURG (BLADE) IMPLANT
BLADE SURG 15 STRL LF DISP TIS (BLADE) ×1 IMPLANT
BLADE SURG 15 STRL SS (BLADE) ×2
CEMENT BONE KYPHX HV R (Orthopedic Implant) ×1 IMPLANT
CONT SPEC 4OZ CLIKSEAL STRL BL (MISCELLANEOUS) ×1 IMPLANT
DECANTER SPIKE VIAL GLASS SM (MISCELLANEOUS) ×1 IMPLANT
DRAPE C-ARM 42X72 X-RAY (DRAPES) ×2 IMPLANT
DRAPE LAPAROTOMY 100X72X124 (DRAPES) ×2 IMPLANT
DRAPE PROXIMA HALF (DRAPES) ×1 IMPLANT
DRAPE SURG 17X23 STRL (DRAPES) ×2 IMPLANT
DURAPREP 26ML APPLICATOR (WOUND CARE) ×2 IMPLANT
GAUZE SPONGE 4X4 16PLY XRAY LF (GAUZE/BANDAGES/DRESSINGS) ×2 IMPLANT
GLOVE BIO SURGEON STRL SZ 6.5 (GLOVE) ×1 IMPLANT
GLOVE BIOGEL PI IND STRL 6.5 (GLOVE) IMPLANT
GLOVE BIOGEL PI INDICATOR 6.5 (GLOVE) ×1
GLOVE ECLIPSE 6.5 STRL STRAW (GLOVE) ×2 IMPLANT
GLOVE EXAM NITRILE LRG STRL (GLOVE) IMPLANT
GLOVE EXAM NITRILE MD LF STRL (GLOVE) IMPLANT
GLOVE EXAM NITRILE XL STR (GLOVE) IMPLANT
GLOVE EXAM NITRILE XS STR PU (GLOVE) IMPLANT
GOWN STRL REUS W/ TWL LRG LVL3 (GOWN DISPOSABLE) ×2 IMPLANT
GOWN STRL REUS W/ TWL XL LVL3 (GOWN DISPOSABLE) IMPLANT
GOWN STRL REUS W/TWL 2XL LVL3 (GOWN DISPOSABLE) IMPLANT
GOWN STRL REUS W/TWL LRG LVL3 (GOWN DISPOSABLE) ×4
GOWN STRL REUS W/TWL XL LVL3 (GOWN DISPOSABLE)
KIT BASIN OR (CUSTOM PROCEDURE TRAY) ×2 IMPLANT
KIT ROOM TURNOVER OR (KITS) ×2 IMPLANT
LIQUID BAND (GAUZE/BANDAGES/DRESSINGS) ×2 IMPLANT
NDL HYPO 25X1 1.5 SAFETY (NEEDLE) ×1 IMPLANT
NEEDLE HYPO 25X1 1.5 SAFETY (NEEDLE) ×2 IMPLANT
NS IRRIG 1000ML POUR BTL (IV SOLUTION) ×2 IMPLANT
PACK SURGICAL SETUP 50X90 (CUSTOM PROCEDURE TRAY) ×2 IMPLANT
PAD ARMBOARD 7.5X6 YLW CONV (MISCELLANEOUS) ×4 IMPLANT
SPECIMEN JAR SMALL (MISCELLANEOUS) IMPLANT
STAPLER SKIN PROX WIDE 3.9 (STAPLE) ×1 IMPLANT
SUT VIC AB 3-0 SH 8-18 (SUTURE) ×2 IMPLANT
SYR CONTROL 10ML LL (SYRINGE) ×3 IMPLANT
TOWEL OR 17X24 6PK STRL BLUE (TOWEL DISPOSABLE) ×1 IMPLANT
TOWEL OR 17X26 10 PK STRL BLUE (TOWEL DISPOSABLE) ×2 IMPLANT
TRAY KYPHOPAK 15/3 ONESTEP 1ST (MISCELLANEOUS) ×1 IMPLANT

## 2016-01-13 NOTE — Op Note (Signed)
01/13/2016  5:43 PM  PATIENT:  Yvonne [name]  82 y.o. female with a new T7 compression fracture, and pain. She has had multiple compression fractures in both the thoracic and lumbar regions, treated with kyphoplasty and has done well. She wishes to treat the T7 fracture with a kyphoplasty.  PRE-OPERATIVE DIAGNOSIS:  Compression fracture T7  POST-OPERATIVE DIAGNOSIS:  Compression fracture T7  PROCEDURE:  Procedure(s): Thoracic seven KYPHOPLASTY  SURGEON:  Surgeon(s): Ashok Pall, MD  ANESTHESIA:   general  EBL:  Total I/O In: 800 [I.V.:800] Out: -   BLOOD ADMINISTERED:none  COUNT:per nursing  SPECIMEN:  No Specimen  DICTATION: Mrs. Vaught was taken to the operating room, intubated and placed under a general anesthetic without difficulty. She was positioned prone on the operating room table with all pressure points properly padded. Her back was prepped and draped in a sterile manner. With fluoroscopy I localized the T7 pedicles bilaterally. I injected lidocaine into the entry sites on both the left and right sides. I started by making a stab incision on the right and left  sides and entering the T7 pedicles with fluoroscopic guidance. Once good position was obtained, I drilled into the vertebral body. I then placed the kyphoplasty balloon into the T7vertebra and inflated the balloon. I then inserted a total of 4.5cc of methylmethacrylate into the vertebral body under fluoroscopic guidance. I achieved a very good fill of the cavity, staying within the confines of the vertebral body. I removed the instrumentation from the vertebral body, and the final films looked good. I closed the stab incision with vicryl suture and used Dermabond for a sterile dressing. Marland Kitchen    PLAN OF CARE: Admit to inpatient   PATIENT DISPOSITION:  PACU - hemodynamically stable.   Delay start of Pharmacological VTE agent (>24hrs) due to surgical blood loss or risk of bleeding:  yes

## 2016-01-13 NOTE — Progress Notes (Signed)
Dr. Royce Macadamia has been to bedside twice. Aware of elevated blood pressure and pain. IV Dilaudid 0.5mg  given with little change to BP, IV Labetalol ordered.

## 2016-01-13 NOTE — Anesthesia Procedure Notes (Signed)
Procedure Name: Intubation Date/Time: 01/13/2016 4:15 PM Performed by: Izora Gala Pre-anesthesia Checklist: Patient identified, Emergency Drugs available, Suction available and Patient being monitored Patient Re-evaluated:Patient Re-evaluated prior to inductionOxygen Delivery Method: Circle system utilized Preoxygenation: Pre-oxygenation with 100% oxygen Intubation Type: IV induction Ventilation: Mask ventilation without difficulty Laryngoscope Size: Miller and 3 Grade View: Grade I Tube type: Oral Tube size: 7.0 mm Airway Equipment and Method: Stylet Placement Confirmation: ETT inserted through vocal cords under direct vision,  positive ETCO2 and breath sounds checked- equal and bilateral Secured at: 21 cm Tube secured with: Tape Dental Injury: Teeth and Oropharynx as per pre-operative assessment

## 2016-01-13 NOTE — Anesthesia Preprocedure Evaluation (Addendum)
Anesthesia Evaluation  Patient identified by MRN, date of birth, ID band Patient awake    Reviewed: Allergy & Precautions, NPO status , Patient's Chart, lab work & pertinent test results  History of Anesthesia Complications Negative for: history of anesthetic complications  Airway Mallampati: II  TM Distance: >3 FB Neck ROM: Full    Dental  (+) Edentulous Upper, Dental Advisory Given   Pulmonary neg pulmonary ROS, shortness of breath and with exertion, pneumonia, resolved,    Pulmonary exam normal breath sounds clear to auscultation       Cardiovascular hypertension, Pt. on medications + CABG and + Peripheral Vascular Disease  Normal cardiovascular exam+ dysrhythmias Atrial Fibrillation + Valvular Problems/Murmurs MVP  Rhythm:Regular Rate:Normal + Systolic murmurs Echo [id number] Study Conclusions  - Technical notes: Technically difficult echo due to VERY smallbody habitus, narrow rib spacing, significant scar tissuebuildup, and lung shadowing. - Left ventricle: The cavity size was normal. There was mild focalbasal hypertrophy of the septum. Systolic function was normal.The estimated ejection fraction was in the range of 60% to 65%.Wall motion was normal; there were no regional wall motionabnormalities. The study is not technically sufficient to allowevaluation of LV diastolic function. - Aortic valve: Mildly calcified leaflets. Transvalvular velocity was minimally increased. There was mild regurgitation. Valve area(VTI): 3.37 cm^2. Valve area (Vmax): 3.45 cm^2. Valve area(Vmean): 3.68 cm^2. - Aorta: Not visualized. - Mitral valve: Moderately thickened leaflets . There was trivialregurgitation.  S/P AVR with conduit in 2011  12 lead EKG 12/24/2015 NSR with 1st Deg AV Block ASWMI Voltage criteria for LVH    Neuro/Psych negative neurological ROS  negative psych ROS   GI/Hepatic Neg liver ROS, GERD  Medicated  and Controlled,  Endo/Other  Hypothyroidism Osteoporosis  Renal/GU negative Renal ROS  negative genitourinary   Musculoskeletal  (+) Arthritis , Osteoarthritis,  Compression Fx T7 Hx/o multiple compression Fx Spine in the past    Abdominal   Peds  Hematology negative hematology ROS (+) anemia , Hx/o Blood transfusion in the past   Anesthesia Other Findings   Reproductive/Obstetrics                           Anesthesia Physical  Anesthesia Plan  ASA: IV  Anesthesia Plan: General   Post-op Pain Management:    Induction: Intravenous  Airway Management Planned: Oral ETT  Additional Equipment:   Intra-op Plan:   Post-operative Plan: Extubation in OR  Informed Consent: I have reviewed the patients History and Physical, chart, labs and discussed the procedure including the risks, benefits and alternatives for the proposed anesthesia with the patient or authorized representative who has indicated his/her understanding and acceptance.   Dental advisory given  Plan Discussed with: Anesthesiologist, CRNA and Surgeon  Anesthesia Plan Comments:         Anesthesia Quick Evaluation

## 2016-01-13 NOTE — Transfer of Care (Signed)
Immediate Anesthesia Transfer of Care Note  Patient: Yvonne [name]  Procedure(s) Performed: Procedure(s): Thoracic seven KYPHOPLASTY (N/A)  Patient Location: PACU  Anesthesia Type:General  Level of Consciousness: awake, alert , oriented and patient cooperative  Airway & Oxygen Therapy: Patient Spontanous Breathing and Patient connected to nasal cannula oxygen  Post-op Assessment: Report given to RN, Post -op Vital signs reviewed and stable and Patient moving all extremities  Post vital signs: Reviewed and stable  Last Vitals:  Filed Vitals:   01/13/16 1415 01/13/16 1730  Pulse: 74   Temp:  36.3 C  Resp:      Last Pain:  Filed Vitals:   01/13/16 1742  PainSc: 6       Patients Stated Pain Goal: 3 ([id number])  Complications: No apparent anesthesia complications

## 2016-01-13 NOTE — H&P (Signed)
Yvonne [name] is an 82 y.o. female.   Chief Complaint: back pain HPI: Yvonne [name] is a long term patient of mine whom just underwent an L5 kyphoplasty last month. She had done well until about 3 weeks ago when she again experienced a great deal of back pain. Xray showed a new compression fracture of T7.  Past Medical History  Diagnosis Date  . Thoracic aortic aneurysm ([place]) 05/2010    Ruptured ascending  . History of atrial paroxysmal tachycardia   . History of hypothyroidism   . Compression fracture of L2 lumbar vertebra ([facility])   . Acute blood loss anemia   . Volume overload   . Hypertension   . Mitral valve prolapse   . Hypothyroidism   . Afib ([place])     -no   . History of blood transfusion   . Shortness of breath     with  exertion  . UTI (lower urinary tract infection)   . Arthritis   . Pericardial tamponade 05/2010  . Premature atrial contractions   . Syncope   . Heart murmur   . Moderate aortic insufficiency 05/25/2014  . Pneumonia     many years ago  . GERD (gastroesophageal reflux disease)     "mild"  . Cancer ([place])     skin cancer - arm squamous  . Osteoporosis     Past Surgical History  Procedure Laterality Date  . Revision of left total hip replacement using some allograft  11/23/2010  . Classic atrioventricular nodal reentrant  10/20/2010  . L2 kyphoplasty using kyphon system.  03/17/2009  . Replacement of ascending aorta and hemiarch and resuspension of the aortic valve with circulatory cardiopulmonary bypass and circulatory arrest  06/25/2010    Dr Servando Snare  . Ablation    . Tubal ligation  1966  . Tonsillectomy    . Appendectomy    . Hernia repair  1993    Right Inguinal  . Eye surgery Bilateral 2003    cataract bilateral  . Toe osteotomy      Right 2nd   . Percutaneous pinning wrist fracture Bilateral 1995    bil  . Breast surgery Right     right biospy  . Kyphoplasty  09/05/2012    Procedure: KYPHOPLASTY;  Surgeon: Winfield Cunas, MD;   Location: [facility];  Service: Neurosurgery;  Laterality: N/A;  L1 kyphoplasty  . Colonoscopy    . Kyphoplasty N/A 04/01/2014    Procedure: KYPHOPLASTY;  Surgeon: Ashok Pall, MD;  Location: [facility];  Service: Neurosurgery;  Laterality: N/A;  Lumbar Four Kyphoplasty  . Kyphoplasty N/A 08/07/2015    Procedure: KYPHOPLASTY LUMBAR THREE;  Surgeon: Ashok Pall, MD;  Location: [facility];  Service: Neurosurgery;  Laterality: N/A;  . Kyphoplasty N/A 12/09/2015    Procedure: Lumbar five, KYPHOPLASTY;  Surgeon: Ashok Pall, MD;  Location: [facility];  Service: Neurosurgery;  Laterality: N/A;  L5 kyphoplasty    Family History  Problem Relation Age of Onset  . Heart failure Mother   . Stroke Father   . Hypertension Brother   . Hypertension Brother   . Hypertension Sister   . Hypertension Sister   . Stroke Sister   . Stomach cancer      nephew  . Breast cancer      neice   Social History:  reports that she has never smoked. She has never used smokeless tobacco. She reports that she does not drink alcohol or use illicit drugs.  Allergies: No Known Allergies  Medications Prior to Admission  Medication Sig Dispense Refill  . amLODipine (NORVASC) 5 MG tablet Take 1 tablet (5 mg total) by mouth daily. KEEP OV. 90 tablet 0  . aspirin 325 MG tablet Take 325 mg by mouth daily.    . calcium-vitamin D (OSCAL WITH D) 500-200 MG-UNIT per tablet Take 1 tablet by mouth daily at 12 noon.    . cholecalciferol (VITAMIN D) 1000 UNITS tablet Take 2,000 Units by mouth daily at 12 noon.     . diazepam (VALIUM) 2 MG tablet Take 1 tablet before MRI, and use as needed for anxiety. (Patient taking differently: Take 2 mg by mouth daily as needed for anxiety. Take 1 tablet before MRI, and use as needed for anxiety.) 5 tablet 0  . docusate sodium (COLACE) 100 MG capsule Take 100 mg by mouth 2 (two) times daily as needed for mild constipation.     . flecainide (TAMBOCOR) 50 MG tablet Take 0.5 tablets (25 mg  total) by mouth 2 (two) times daily. 90 tablet 1  . levothyroxine (SYNTHROID, LEVOTHROID) 50 MCG tablet Take 50 mcg by mouth every morning.     Marland Kitchen losartan (COZAAR) 50 MG tablet Take 1 tablet by mouth in  the morning and 1/2 tablet  in the evening 135 tablet 6  . oxyCODONE-acetaminophen (PERCOCET/ROXICET) 5-325 MG tablet Take 1 tablet by mouth every 4 (four) hours as needed for moderate pain.     . traMADol (ULTRAM) 50 MG tablet Take 1 tablet (50 mg total) by mouth every 6 (six) hours as needed for moderate pain. 50 tablet 0  . amoxicillin (AMOXIL) 500 MG tablet Take 4 tablets (2gram) by mouth one hour prior to dental work. 20 tablet 0    Results for orders placed or performed during the hospital encounter of 01/13/16 (from the past 48 hour(s))  Basic metabolic panel     Status: Abnormal   Collection Time: 01/13/16  1:44 PM  Result Value Ref Range   Sodium 137 135 - 145 mmol/L   Potassium 4.6 3.5 - 5.1 mmol/L   Chloride 101 101 - 111 mmol/L   CO2 25 22 - 32 mmol/L   Glucose, Bld 101 (H) 65 - 99 mg/dL   BUN 17 6 - 20 mg/dL   Creatinine, Ser 1.09 (H) 0.44 - 1.00 mg/dL   Calcium 9.7 8.9 - 10.3 mg/dL   GFR calc non Af Amer 44 (L) >60 mL/min   GFR calc Af Amer 51 (L) >60 mL/min    Comment: (NOTE) The eGFR has been calculated using the CKD EPI equation. This calculation has not been validated in all clinical situations. eGFR's persistently <60 mL/min signify possible Chronic Kidney Disease.    Anion gap 11 5 - 15  CBC     Status: None   Collection Time: 01/13/16  1:44 PM  Result Value Ref Range   WBC 6.0 4.0 - 10.5 K/uL   RBC 4.59 3.87 - 5.11 MIL/uL   Hemoglobin 14.3 12.0 - 15.0 g/dL   HCT 43.0 36.0 - 46.0 %   MCV 93.7 78.0 - 100.0 fL   MCH 31.2 26.0 - 34.0 pg   MCHC 33.3 30.0 - 36.0 g/dL   RDW 14.5 11.5 - 15.5 %   Platelets 196 150 - 400 K/uL   No results found.  Review of Systems  Constitutional: Negative.   HENT: Negative.   Eyes: Negative.   Respiratory: Negative.    Cardiovascular: Negative.  Gastrointestinal: Negative.   Genitourinary: Negative.   Musculoskeletal: Positive for back pain.  Skin: Negative.   Neurological: Negative.   Endo/Heme/Allergies: Negative.   Psychiatric/Behavioral: Negative.     Pulse 74, temperature 97.7 F (36.5 C), temperature source Oral, resp. rate 18, height 5' 6" (1.676 m), weight 51.88 kg (114 lb 6 oz), SpO2 100 %. Physical Exam  Constitutional: She is oriented to person, place, and time. She appears well-developed and well-nourished.  HENT:  Head: Normocephalic and atraumatic.  Eyes: Conjunctivae and EOM are normal. Pupils are equal, round, and reactive to light.  Neck: Normal range of motion. Neck supple.  Cardiovascular: Normal rate, regular rhythm, normal heart sounds and intact distal pulses.   Respiratory: Effort normal and breath sounds normal.  GI: Soft. Bowel sounds are normal.  Musculoskeletal: Normal range of motion. She exhibits tenderness.  Neurological: She is alert and oriented to person, place, and time. She has normal strength and normal reflexes. She displays normal reflexes. No cranial nerve deficit or sensory deficit. She exhibits normal muscle tone. She displays a negative Romberg sign. Coordination normal.  Skin: Skin is warm and dry.  Psychiatric: She has a normal mood and affect. Her behavior is normal. Judgment and thought content normal.     Assessment/Plan OR for T7 kyphoplasty. Risks including bleeding, infection, paralysis, weakness in the lower extremities, bowel and or bladder dysfunction, no pain relief, and other risks were discussed. She understands and wishes to proceed.   CABBELL,KYLE L, MD 01/13/2016, 3:55 PM

## 2016-01-14 ENCOUNTER — Encounter (HOSPITAL_COMMUNITY): Payer: Self-pay | Admitting: Neurosurgery

## 2016-01-14 ENCOUNTER — Telehealth: Payer: Self-pay | Admitting: Family Medicine

## 2016-01-14 DIAGNOSIS — M4854XA Collapsed vertebra, not elsewhere classified, thoracic region, initial encounter for fracture: Secondary | ICD-10-CM | POA: Diagnosis not present

## 2016-01-14 MED ORDER — TRAMADOL HCL 50 MG PO TABS: 50.0000 mg | ORAL_TABLET | Freq: Four times a day (QID) | ORAL | Status: DC | PRN

## 2016-01-14 NOTE — Anesthesia Postprocedure Evaluation (Signed)
Anesthesia Post Note  Patient: Yvonne [name]  Procedure(s) Performed: Procedure(s) (LRB): Thoracic seven KYPHOPLASTY (N/A)  Patient location during evaluation: PACU Anesthesia Type: General Level of consciousness: awake and alert and patient cooperative Pain management: pain level controlled Vital Signs Assessment: post-procedure vital signs reviewed and stable Respiratory status: spontaneous breathing and respiratory function stable Cardiovascular status: stable Anesthetic complications: no    Last Vitals:  Filed Vitals:   01/14/16 0515 01/14/16 0809  BP: 116/38 149/40  Pulse: 70 69  Temp: 36.9 C 37.1 C  Resp: 18 18    Last Pain:  Filed Vitals:   01/14/16 1054  PainSc: [place]

## 2016-01-14 NOTE — Telephone Encounter (Signed)
Called and informed pt daughter

## 2016-01-14 NOTE — Telephone Encounter (Signed)
I am so glad they were able to do her surgery and hopefully she is feeling better!

## 2016-01-14 NOTE — Telephone Encounter (Signed)
Patient's daughter Amedeo Plenty) calling to personally thank Dr. Birdie Riddle and Janett Billow for their persistence with getting her mother referred for surgery.  The patient had surgery for her fractured vertebrae on yesterday, which went well.  She is home recovering at this time.

## 2016-01-14 NOTE — Progress Notes (Signed)
Pt doing well. Pt and daughter given D/C instructions with Rx, verbal understanding was provided. Pt's IV was removed prior to D/C. Pt's incision is open to air and has no sign of infection. Pt D/C'd home via wheelchair @ 1100 per MD order. Pt is stable @ D/C and has no other needs at this time. Holli Humbles, RN

## 2016-01-19 ENCOUNTER — Other Ambulatory Visit (HOSPITAL_BASED_OUTPATIENT_CLINIC_OR_DEPARTMENT_OTHER): Payer: Self-pay | Admitting: Neurosurgery

## 2016-01-19 ENCOUNTER — Other Ambulatory Visit: Payer: Self-pay | Admitting: Neurosurgery

## 2016-01-19 ENCOUNTER — Ambulatory Visit (INDEPENDENT_AMBULATORY_CARE_PROVIDER_SITE_OTHER): Payer: Medicare Other

## 2016-01-19 DIAGNOSIS — M546 Pain in thoracic spine: Secondary | ICD-10-CM | POA: Diagnosis not present

## 2016-01-19 DIAGNOSIS — R52 Pain, unspecified: Secondary | ICD-10-CM

## 2016-01-21 ENCOUNTER — Encounter (HOSPITAL_COMMUNITY): Payer: Self-pay | Admitting: *Deleted

## 2016-01-21 NOTE — Progress Notes (Signed)
Spoke with pt for pre-op call. She was just here last week and she verifies nothing has changed with her medications, allergies, medical and surgical history. Pre-op instructions given.

## 2016-01-22 ENCOUNTER — Ambulatory Visit (HOSPITAL_COMMUNITY): Payer: Medicare Other | Admitting: Anesthesiology

## 2016-01-22 ENCOUNTER — Encounter (HOSPITAL_COMMUNITY)
Admission: RE | Disposition: A | Discharge status: Home / Self Care | Payer: Self-pay | Source: Ambulatory Visit | Attending: Neurosurgery

## 2016-01-22 ENCOUNTER — Encounter (HOSPITAL_COMMUNITY): Payer: Self-pay | Admitting: *Deleted

## 2016-01-22 ENCOUNTER — Observation Stay (HOSPITAL_COMMUNITY)
Admission: RE | Admit: 2016-01-22 | Discharge: 2016-01-22 | Disposition: A | Discharge status: Home / Self Care | Payer: Medicare Other | Source: Ambulatory Visit | Attending: Neurosurgery | Admitting: Neurosurgery

## 2016-01-22 ENCOUNTER — Ambulatory Visit (HOSPITAL_COMMUNITY): Payer: Medicare Other

## 2016-01-22 DIAGNOSIS — M1990 Unspecified osteoarthritis, unspecified site: Secondary | ICD-10-CM | POA: Diagnosis not present

## 2016-01-22 DIAGNOSIS — Z85828 Personal history of other malignant neoplasm of skin: Secondary | ICD-10-CM | POA: Insufficient documentation

## 2016-01-22 DIAGNOSIS — K219 Gastro-esophageal reflux disease without esophagitis: Secondary | ICD-10-CM | POA: Diagnosis not present

## 2016-01-22 DIAGNOSIS — I739 Peripheral vascular disease, unspecified: Secondary | ICD-10-CM | POA: Insufficient documentation

## 2016-01-22 DIAGNOSIS — S22069A Unspecified fracture of T7-T8 vertebra, initial encounter for closed fracture: Principal | ICD-10-CM | POA: Insufficient documentation

## 2016-01-22 DIAGNOSIS — X58XXXA Exposure to other specified factors, initial encounter: Secondary | ICD-10-CM | POA: Diagnosis not present

## 2016-01-22 DIAGNOSIS — S22000A Wedge compression fracture of unspecified thoracic vertebra, initial encounter for closed fracture: Secondary | ICD-10-CM | POA: Diagnosis present

## 2016-01-22 DIAGNOSIS — I351 Nonrheumatic aortic (valve) insufficiency: Secondary | ICD-10-CM | POA: Diagnosis not present

## 2016-01-22 DIAGNOSIS — Z79899 Other long term (current) drug therapy: Secondary | ICD-10-CM | POA: Insufficient documentation

## 2016-01-22 DIAGNOSIS — Z955 Presence of coronary angioplasty implant and graft: Secondary | ICD-10-CM | POA: Diagnosis not present

## 2016-01-22 DIAGNOSIS — R0602 Shortness of breath: Secondary | ICD-10-CM | POA: Diagnosis not present

## 2016-01-22 DIAGNOSIS — I10 Essential (primary) hypertension: Secondary | ICD-10-CM | POA: Diagnosis not present

## 2016-01-22 DIAGNOSIS — E039 Hypothyroidism, unspecified: Secondary | ICD-10-CM | POA: Insufficient documentation

## 2016-01-22 DIAGNOSIS — Z7982 Long term (current) use of aspirin: Secondary | ICD-10-CM | POA: Insufficient documentation

## 2016-01-22 DIAGNOSIS — I341 Nonrheumatic mitral (valve) prolapse: Secondary | ICD-10-CM | POA: Diagnosis not present

## 2016-01-22 DIAGNOSIS — Z419 Encounter for procedure for purposes other than remedying health state, unspecified: Secondary | ICD-10-CM

## 2016-01-22 HISTORY — PX: KYPHOPLASTY: SHX5884

## 2016-01-22 SURGERY — KYPHOPLASTY
Anesthesia: General | Site: Spine Thoracic

## 2016-01-22 MED ORDER — LIDOCAINE-EPINEPHRINE 0.5 %-1:200000 IJ SOLN
INTRAMUSCULAR | Status: DC | PRN
  Administered 2016-01-22: 5 mL

## 2016-01-22 MED ORDER — ARTIFICIAL TEARS OP OINT
TOPICAL_OINTMENT | OPHTHALMIC | Status: DC | PRN
  Administered 2016-01-22: 1 via OPHTHALMIC

## 2016-01-22 MED ORDER — LOSARTAN POTASSIUM 50 MG PO TABS
50.0000 mg | ORAL_TABLET | Freq: Every day | ORAL | Status: DC
  Administered 2016-01-22: 50 mg via ORAL
  Filled 2016-01-22: qty 1

## 2016-01-22 MED ORDER — IBUPROFEN 200 MG PO TABS: 400.0000 mg | ORAL_TABLET | Freq: Four times a day (QID) | ORAL | Status: DC | PRN

## 2016-01-22 MED ORDER — CALCIUM CARBONATE-VITAMIN D 500-200 MG-UNIT PO TABS: 1.0000 | ORAL_TABLET | Freq: Every day | ORAL | Status: DC

## 2016-01-22 MED ORDER — ROCURONIUM BROMIDE 50 MG/5ML IV SOLN
INTRAVENOUS | Status: AC
  Filled 2016-01-22: qty 1

## 2016-01-22 MED ORDER — LABETALOL HCL 5 MG/ML IV SOLN
INTRAVENOUS | Status: AC
  Filled 2016-01-22: qty 4

## 2016-01-22 MED ORDER — LIDOCAINE 2% (20 MG/ML) 5 ML SYRINGE
INTRAMUSCULAR | Status: AC
  Filled 2016-01-22: qty 5

## 2016-01-22 MED ORDER — VITAMIN D 1000 UNITS PO TABS: 2000.0000 [IU] | ORAL_TABLET | Freq: Every day | ORAL | Status: DC

## 2016-01-22 MED ORDER — ONDANSETRON HCL 4 MG/2ML IJ SOLN
INTRAMUSCULAR | Status: DC | PRN
  Administered 2016-01-22: 4 mg via INTRAVENOUS

## 2016-01-22 MED ORDER — NEOSTIGMINE METHYLSULFATE 5 MG/5ML IV SOSY
PREFILLED_SYRINGE | INTRAVENOUS | Status: AC
  Filled 2016-01-22: qty 5

## 2016-01-22 MED ORDER — ONDANSETRON HCL 4 MG/2ML IJ SOLN
INTRAMUSCULAR | Status: AC
  Filled 2016-01-22: qty 2

## 2016-01-22 MED ORDER — FENTANYL CITRATE (PF) 100 MCG/2ML IJ SOLN
INTRAMUSCULAR | Status: DC | PRN
  Administered 2016-01-22: 50 ug via INTRAVENOUS

## 2016-01-22 MED ORDER — HYDROMORPHONE HCL 1 MG/ML IJ SOLN: 0.5000 mg | Freq: Once | INTRAMUSCULAR | Status: DC

## 2016-01-22 MED ORDER — ASPIRIN EC 325 MG PO TBEC
325.0000 mg | DELAYED_RELEASE_TABLET | Freq: Every day | ORAL | Status: DC
  Filled 2016-01-22: qty 1

## 2016-01-22 MED ORDER — OXYCODONE-ACETAMINOPHEN 5-325 MG PO TABS
1.0000 | ORAL_TABLET | ORAL | Status: DC | PRN
  Administered 2016-01-22: 1 via ORAL
  Filled 2016-01-22: qty 1

## 2016-01-22 MED ORDER — FENTANYL CITRATE (PF) 100 MCG/2ML IJ SOLN: 25.0000 ug | INTRAMUSCULAR | Status: DC | PRN

## 2016-01-22 MED ORDER — LEVOTHYROXINE SODIUM 100 MCG PO TABS: 50.0000 ug | ORAL_TABLET | Freq: Every day | ORAL | Status: DC

## 2016-01-22 MED ORDER — FLECAINIDE ACETATE 50 MG PO TABS
25.0000 mg | ORAL_TABLET | Freq: Two times a day (BID) | ORAL | Status: DC
  Filled 2016-01-22: qty 1

## 2016-01-22 MED ORDER — 0.9 % SODIUM CHLORIDE (POUR BTL) OPTIME
TOPICAL | Status: DC | PRN
  Administered 2016-01-22: 1000 mL

## 2016-01-22 MED ORDER — LABETALOL HCL 5 MG/ML IV SOLN
5.0000 mg | INTRAVENOUS | Status: DC | PRN
  Administered 2016-01-22 (×2): 5 mg via INTRAVENOUS

## 2016-01-22 MED ORDER — DOCUSATE SODIUM 100 MG PO CAPS: 100.0000 mg | ORAL_CAPSULE | Freq: Two times a day (BID) | ORAL | Status: DC | PRN

## 2016-01-22 MED ORDER — ROCURONIUM BROMIDE 100 MG/10ML IV SOLN
INTRAVENOUS | Status: DC | PRN
  Administered 2016-01-22: 40 mg via INTRAVENOUS

## 2016-01-22 MED ORDER — AMLODIPINE BESYLATE 5 MG PO TABS: 5.0000 mg | ORAL_TABLET | Freq: Every day | ORAL | Status: DC

## 2016-01-22 MED ORDER — SUGAMMADEX SODIUM 200 MG/2ML IV SOLN
INTRAVENOUS | Status: DC | PRN
  Administered 2016-01-22: 200 mg via INTRAVENOUS

## 2016-01-22 MED ORDER — LIDOCAINE HCL (CARDIAC) 20 MG/ML IV SOLN
INTRAVENOUS | Status: DC | PRN
  Administered 2016-01-22: 50 mg via INTRAVENOUS

## 2016-01-22 MED ORDER — MORPHINE SULFATE (PF) 2 MG/ML IV SOLN: 2.0000 mg | INTRAVENOUS | Status: DC | PRN

## 2016-01-22 MED ORDER — HYDROMORPHONE HCL 1 MG/ML IJ SOLN
0.5000 mg | INTRAMUSCULAR | Status: DC | PRN
  Administered 2016-01-22: 0.5 mg via INTRAVENOUS

## 2016-01-22 MED ORDER — GLYCOPYRROLATE 0.2 MG/ML IV SOSY
PREFILLED_SYRINGE | INTRAVENOUS | Status: AC
  Filled 2016-01-22: qty 3

## 2016-01-22 MED ORDER — PHENYLEPHRINE 40 MCG/ML (10ML) SYRINGE FOR IV PUSH (FOR BLOOD PRESSURE SUPPORT)
PREFILLED_SYRINGE | INTRAVENOUS | Status: AC
  Filled 2016-01-22: qty 10

## 2016-01-22 MED ORDER — TRAMADOL HCL 50 MG PO TABS: 50.0000 mg | ORAL_TABLET | Freq: Four times a day (QID) | ORAL | Status: DC | PRN

## 2016-01-22 MED ORDER — PROPOFOL 10 MG/ML IV BOLUS
INTRAVENOUS | Status: AC
  Filled 2016-01-22: qty 20

## 2016-01-22 MED ORDER — PROPOFOL 10 MG/ML IV BOLUS
INTRAVENOUS | Status: DC | PRN
  Administered 2016-01-22: 100 mg via INTRAVENOUS
  Administered 2016-01-22: 20 mg via INTRAVENOUS

## 2016-01-22 MED ORDER — EPHEDRINE SULFATE 50 MG/ML IJ SOLN
INTRAMUSCULAR | Status: DC | PRN
  Administered 2016-01-22: 10 mg via INTRAVENOUS

## 2016-01-22 MED ORDER — HYDROMORPHONE HCL 1 MG/ML IJ SOLN
INTRAMUSCULAR | Status: AC
  Filled 2016-01-22: qty 1

## 2016-01-22 MED ORDER — CEFAZOLIN SODIUM 1-5 GM-% IV SOLN
INTRAVENOUS | Status: DC | PRN
  Administered 2016-01-22: 1 g via INTRAVENOUS

## 2016-01-22 MED ORDER — FENTANYL CITRATE (PF) 250 MCG/5ML IJ SOLN
INTRAMUSCULAR | Status: AC
  Filled 2016-01-22: qty 5

## 2016-01-22 MED ORDER — EPHEDRINE 5 MG/ML INJ
INTRAVENOUS | Status: AC
  Filled 2016-01-22: qty 10

## 2016-01-22 MED ORDER — SUGAMMADEX SODIUM 200 MG/2ML IV SOLN
INTRAVENOUS | Status: AC
  Filled 2016-01-22: qty 2

## 2016-01-22 MED ORDER — ACETAMINOPHEN 500 MG PO TABS: 1000.0000 mg | ORAL_TABLET | Freq: Four times a day (QID) | ORAL | Status: DC | PRN

## 2016-01-22 MED ORDER — LACTATED RINGERS IV SOLN
INTRAVENOUS | Status: DC
  Administered 2016-01-22 (×2): via INTRAVENOUS

## 2016-01-22 SURGICAL SUPPLY — 35 items
BLADE SURG 15 STRL LF DISP TIS (BLADE) ×1 IMPLANT
BLADE SURG 15 STRL SS (BLADE) ×3
CEMENT BONE KYPHX HV R (Orthopedic Implant) ×2 IMPLANT
CEMENT KYPHON C01A KIT/MIXER (Cement) ×2 IMPLANT
COVER MAYO STAND STRL (DRAPES) ×2 IMPLANT
DRAPE C-ARM 42X72 X-RAY (DRAPES) ×5 IMPLANT
DRAPE LAPAROTOMY 100X72X124 (DRAPES) ×3 IMPLANT
DRAPE PROXIMA HALF (DRAPES) ×3 IMPLANT
DRAPE SURG 17X23 STRL (DRAPES) ×3 IMPLANT
DURAPREP 26ML APPLICATOR (WOUND CARE) ×3 IMPLANT
GAUZE SPONGE 4X4 16PLY XRAY LF (GAUZE/BANDAGES/DRESSINGS) ×3 IMPLANT
GLOVE BIO SURGEON STRL SZ 6.5 (GLOVE) ×1 IMPLANT
GLOVE BIO SURGEONS STRL SZ 6.5 (GLOVE) ×1
GLOVE BIOGEL PI IND STRL 7.0 (GLOVE) IMPLANT
GLOVE BIOGEL PI INDICATOR 7.0 (GLOVE) ×2
GLOVE ECLIPSE 6.5 STRL STRAW (GLOVE) ×5 IMPLANT
GOWN STRL REUS W/ TWL LRG LVL3 (GOWN DISPOSABLE) ×2 IMPLANT
GOWN STRL REUS W/ TWL XL LVL3 (GOWN DISPOSABLE) IMPLANT
GOWN STRL REUS W/TWL 2XL LVL3 (GOWN DISPOSABLE) IMPLANT
GOWN STRL REUS W/TWL LRG LVL3 (GOWN DISPOSABLE) ×6
GOWN STRL REUS W/TWL XL LVL3 (GOWN DISPOSABLE)
KIT BASIN OR (CUSTOM PROCEDURE TRAY) ×3 IMPLANT
KIT ROOM TURNOVER OR (KITS) ×3 IMPLANT
LIQUID BAND (GAUZE/BANDAGES/DRESSINGS) ×3 IMPLANT
NDL HYPO 25X1 1.5 SAFETY (NEEDLE) ×1 IMPLANT
NEEDLE HYPO 25X1 1.5 SAFETY (NEEDLE) ×3 IMPLANT
NS IRRIG 1000ML POUR BTL (IV SOLUTION) ×3 IMPLANT
PACK SURGICAL SETUP 50X90 (CUSTOM PROCEDURE TRAY) ×3 IMPLANT
PAD ARMBOARD 7.5X6 YLW CONV (MISCELLANEOUS) ×9 IMPLANT
STAPLER SKIN PROX WIDE 3.9 (STAPLE) ×1 IMPLANT
SUT VIC AB 3-0 SH 8-18 (SUTURE) ×3 IMPLANT
SYR CONTROL 10ML LL (SYRINGE) ×4 IMPLANT
TOWEL OR 17X24 6PK STRL BLUE (TOWEL DISPOSABLE) ×1 IMPLANT
TOWEL OR 17X26 10 PK STRL BLUE (TOWEL DISPOSABLE) ×3 IMPLANT
TRAY KYPHOPAK 15/3 ONESTEP 1ST (MISCELLANEOUS) ×2 IMPLANT

## 2016-01-22 NOTE — Transfer of Care (Signed)
Immediate Anesthesia Transfer of Care Note  Patient: Yvonne [name]  Procedure(s) Performed: Procedure(s) with comments: THORACIC EIGHT KYPHOPLASTY (N/A) - T8 KYPHOPLASTY  Patient Location: PACU  Anesthesia Type:General  Level of Consciousness: awake, alert  and oriented  Airway & Oxygen Therapy: Patient connected to face mask oxygen  Post-op Assessment: Post -op Vital signs reviewed and stable  Post vital signs: stable  Last Vitals:  Filed Vitals:   01/22/16 0846  BP: 194/62  Pulse: 83  Temp: 36.7 C  Resp: 18    Last Pain: There were no vitals filed for this visit.       Complications: No apparent anesthesia complications

## 2016-01-22 NOTE — Discharge Summary (Signed)
Physician Discharge Summary  Patient ID: Yvonne [name] MRN: [id number] DOB/AGE: 1926/01/28 82 y.o.  Admit date: 01/22/2016 Discharge date: 01/22/2016  Admission Diagnoses:T8 compression fracture  Discharge Diagnoses:  Active Problems:   Compression fracture of thoracic vertebra [facility])   Discharged Condition: good  Hospital Course: [name] was admitted and taken to the hospital for an uncomplicated percutaneous kyphoplasty at T8. Post op she is moving her lower extremities well. Wounds are clean, dry, and without signs of infection. She is voiding, tolerating a regular diet, and ambulating at discharge.     Treatments: surgery: as above  Discharge Exam: Blood pressure 170/68, pulse 82, temperature 97.5 F (36.4 C), temperature source Oral, resp. rate 18, height 5\' 6"  (1.676 m), weight 51.88 kg (114 lb 6 oz), SpO2 99 %. General appearance: alert, cooperative, appears stated age and mild distress  Disposition: 01-Home or Self Care     Medication List    TAKE these medications        acetaminophen 500 MG tablet  Commonly known as:  TYLENOL  Take 1,000 mg by mouth every 6 (six) hours as needed (For pain.).     amLODipine 5 MG tablet  Commonly known as:  NORVASC  Take 1 tablet (5 mg total) by mouth daily. KEEP OV.     amoxicillin 500 MG tablet  Commonly known as:  AMOXIL  Take 4 tablets (2gram) by mouth one hour prior to dental work.     aspirin EC 325 MG tablet  Take 325 mg by mouth daily.     calcium-vitamin D 500-200 MG-UNIT tablet  Commonly known as:  OSCAL WITH D  Take 1 tablet by mouth daily at 12 noon.     cholecalciferol 1000 units tablet  Commonly known as:  VITAMIN D  Take 2,000 Units by mouth daily at 12 noon.     diazepam 2 MG tablet  Commonly known as:  VALIUM  Take 1 tablet before MRI, and use as needed for anxiety.     docusate sodium 100 MG capsule  Commonly known as:  COLACE  Take 100 mg by mouth 2 (two) times daily as needed for mild  constipation.     flecainide 50 MG tablet  Commonly known as:  TAMBOCOR  Take 0.5 tablets (25 mg total) by mouth 2 (two) times daily.     ibuprofen 200 MG tablet  Commonly known as:  ADVIL,MOTRIN  Take 400 mg by mouth every 6 (six) hours as needed (For pain.).     levothyroxine 50 MCG tablet  Commonly known as:  SYNTHROID, LEVOTHROID  Take 50 mcg by mouth every morning.     losartan 50 MG tablet  Commonly known as:  COZAAR  Take 1 tablet by mouth in  the morning and 1/2 tablet  in the evening     oxyCODONE-acetaminophen 5-325 MG tablet  Commonly known as:  PERCOCET/ROXICET  Take 1 tablet by mouth every 4 (four) hours as needed for moderate pain.     traMADol 50 MG tablet  Commonly known as:  ULTRAM  Take 1 tablet (50 mg total) by mouth every 6 (six) hours as needed.           Follow-up Information    Follow up with Jaquise Faux L, MD In 3 weeks.   Specialty:  Neurosurgery   Why:  please call the office to make an appointment   Contact information:   [address] [place] [address] [place] [address] [phone]  Signed: Ulrich Soules L 01/22/2016, 3:17 PM

## 2016-01-22 NOTE — Progress Notes (Signed)
pts teeth returned 

## 2016-01-22 NOTE — Discharge Summary (Signed)
  Physician Discharge Summary  Patient ID: [name] MRN: [id number] DOB/AGE: 80-Oct-1927 80 y.o.  Admit date: 01/13/2016 Discharge date: 01/22/2016  Admission Diagnoses:T7 compression fracture  Discharge Diagnoses:  Active Problems:   Traumatic compression fracture of T7 thoracic vertebra [facility])   Discharged Condition: good  Hospital Course: Yvonne [name] was admitted and taken to the operating room for an uncomplicated kyphoplasty of T7. She did well post op. She is voiding, ambulating and tolerating a regular diet at discharge.   Treatments: surgery: as above  Discharge Exam: Blood pressure 149/40, pulse 69, temperature 98.8 F (37.1 C), temperature source Oral, resp. rate 18, height 5\' 6"  (1.676 m), weight 51.88 kg (114 lb 6 oz), SpO2 97 %. General appearance: alert, cooperative and appears stated age Neurologic: Alert and oriented X 3, normal strength and tone. Normal symmetric reflexes. Normal coordination and gait  Disposition: 01-Home or Self Care Compression fracture    Medication List    STOP taking these medications        aspirin 325 MG tablet      TAKE these medications        amLODipine 5 MG tablet  Commonly known as:  NORVASC  Take 1 tablet (5 mg total) by mouth daily. KEEP OV.     amoxicillin 500 MG tablet  Commonly known as:  AMOXIL  Take 4 tablets (2gram) by mouth one hour prior to dental work.     calcium-vitamin D 500-200 MG-UNIT tablet  Commonly known as:  OSCAL WITH D  Take 1 tablet by mouth daily at 12 noon.     cholecalciferol 1000 units tablet  Commonly known as:  VITAMIN D  Take 2,000 Units by mouth daily at 12 noon.     diazepam 2 MG tablet  Commonly known as:  VALIUM  Take 1 tablet before MRI, and use as needed for anxiety.     docusate sodium 100 MG capsule  Commonly known as:  COLACE  Take 100 mg by mouth 2 (two) times daily as needed for mild constipation.     flecainide 50 MG tablet  Commonly known as:  TAMBOCOR  Take  0.5 tablets (25 mg total) by mouth 2 (two) times daily.     levothyroxine 50 MCG tablet  Commonly known as:  SYNTHROID, LEVOTHROID  Take 50 mcg by mouth every morning.     losartan 50 MG tablet  Commonly known as:  COZAAR  Take 1 tablet by mouth in  the morning and 1/2 tablet  in the evening     oxyCODONE-acetaminophen 5-325 MG tablet  Commonly known as:  PERCOCET/ROXICET  Take 1 tablet by mouth every 4 (four) hours as needed for moderate pain.     traMADol 50 MG tablet  Commonly known as:  ULTRAM  Take 1 tablet (50 mg total) by mouth every 6 (six) hours as needed.           Follow-up Information    Follow up with Dehlia Kilner L, MD In 3 weeks.   Specialty:  Neurosurgery   Why:  call office to make an appointment   Contact information:   [address] [place] [address] [place] [address] [phone]       Signed: Winfield Cunas 01/22/2016, 2:46 PM

## 2016-01-22 NOTE — Progress Notes (Signed)
Patient complaining of pain and requesting pain medications.  Dr. Royce Macadamia ordered 0.5 mg of Dilaudid but patient has no order for consent.  Dr. Christella Noa notified and stated that order for consent would have to be given when he arrives to hospital.  Dr. Landry Dyke aware of situation and stated that pain medication can be given and daughter at bedside can sign consent for surgery.

## 2016-01-22 NOTE — Progress Notes (Signed)
Pt in preop with lots of pain, Dilaudid given per Dr Marylynn Pearson orders

## 2016-01-22 NOTE — Anesthesia Preprocedure Evaluation (Signed)
Anesthesia Evaluation  Patient identified by MRN, date of birth, ID band Patient awake    Reviewed: Allergy & Precautions, NPO status , Patient's Chart, lab work & pertinent test results  History of Anesthesia Complications Negative for: history of anesthetic complications  Airway Mallampati: II  TM Distance: >3 FB Neck ROM: Full    Dental  (+) Edentulous Upper, Dental Advisory Given   Pulmonary neg pulmonary ROS, shortness of breath and with exertion, pneumonia, resolved,    Pulmonary exam normal breath sounds clear to auscultation       Cardiovascular hypertension, Pt. on medications + CABG and + Peripheral Vascular Disease  Normal cardiovascular exam+ dysrhythmias Atrial Fibrillation and Supra Ventricular Tachycardia + Valvular Problems/Murmurs MVP and AI  Rhythm:Regular Rate:Normal + Systolic murmurs Echo [id number] Study Conclusions  - Technical notes: Technically difficult echo due to VERY smallbody habitus, narrow rib spacing, significant scar tissuebuildup, and lung shadowing. - Left ventricle: The cavity size was normal. There was mild focalbasal hypertrophy of the septum. Systolic function was normal.The estimated ejection fraction was in the range of 60% to 65%.Wall motion was normal; there were no regional wall motionabnormalities. The study is not technically sufficient to allowevaluation of LV diastolic function. - Aortic valve: Mildly calcified leaflets. Transvalvular velocity was minimally increased. There was mild regurgitation. Valve area(VTI): 3.37 cm^2. Valve area (Vmax): 3.45 cm^2. Valve area(Vmean): 3.68 cm^2. - Aorta: Not visualized. - Mitral valve: Moderately thickened leaflets . There was trivialregurgitation.  S/P AVR with conduit in 2011  12 lead EKG 12/24/2015 NSR with 1st Deg AV Block ASWMI Voltage criteria for LVH    Neuro/Psych negative neurological ROS  negative psych ROS   GI/Hepatic Neg liver ROS, GERD  Medicated and Controlled,  Endo/Other  Hypothyroidism Osteoporosis  Renal/GU negative Renal ROS  negative genitourinary   Musculoskeletal  (+) Arthritis , Osteoarthritis,  Compression Fx T7 Hx/o multiple compression Fx Spine in the past    Abdominal   Peds  Hematology negative hematology ROS (+) anemia , Hx/o Blood transfusion in the past   Anesthesia Other Findings   Reproductive/Obstetrics                             Anesthesia Physical Anesthesia Plan  ASA: III  Anesthesia Plan: General   Post-op Pain Management:    Induction: Intravenous  Airway Management Planned: Nasal ETT  Additional Equipment:   Intra-op Plan:   Post-operative Plan: Extubation in OR  Informed Consent:   Plan Discussed with: Surgeon  Anesthesia Plan Comments:         Anesthesia Quick Evaluation

## 2016-01-22 NOTE — H&P (Signed)
Yvonne [name] is an 82 y.o. female.   Chief Complaint: T8 compression fracture causing back pain HPI: new T8 compression fracture with severe pain. No neurologic deficits  Past Medical History  Diagnosis Date  . Thoracic aortic aneurysm ([place]) 05/2010    Ruptured ascending  . History of atrial paroxysmal tachycardia   . History of hypothyroidism   . Compression fracture of L2 lumbar vertebra ([facility])   . Acute blood loss anemia   . Volume overload   . Hypertension   . Mitral valve prolapse   . Hypothyroidism   . Afib ([place])     -no   . History of blood transfusion   . Shortness of breath     with  exertion  . UTI (lower urinary tract infection)   . Arthritis   . Pericardial tamponade 05/2010  . Premature atrial contractions   . Syncope   . Heart murmur   . Moderate aortic insufficiency 05/25/2014  . Pneumonia     many years ago  . GERD (gastroesophageal reflux disease)     "mild"  . Cancer ([place])     skin cancer - arm squamous  . Osteoporosis     Past Surgical History  Procedure Laterality Date  . Revision of left total hip replacement using some allograft  11/23/2010  . Classic atrioventricular nodal reentrant  10/20/2010  . L2 kyphoplasty using kyphon system.  03/17/2009  . Replacement of ascending aorta and hemiarch and resuspension of the aortic valve with circulatory cardiopulmonary bypass and circulatory arrest  06/25/2010    Dr Servando Snare  . Ablation    . Tubal ligation  1966  . Tonsillectomy    . Appendectomy    . Hernia repair  1993    Right Inguinal  . Eye surgery Bilateral 2003    cataract bilateral  . Toe osteotomy      Right 2nd   . Percutaneous pinning wrist fracture Bilateral 1995    bil  . Breast surgery Right     right biospy  . Kyphoplasty  09/05/2012    Procedure: KYPHOPLASTY;  Surgeon: Winfield Cunas, MD;  Location: [facility];  Service: Neurosurgery;  Laterality: N/A;  L1 kyphoplasty  . Colonoscopy    . Kyphoplasty N/A 04/01/2014   Procedure: KYPHOPLASTY;  Surgeon: Ashok Pall, MD;  Location: [facility];  Service: Neurosurgery;  Laterality: N/A;  Lumbar Four Kyphoplasty  . Kyphoplasty N/A 08/07/2015    Procedure: KYPHOPLASTY LUMBAR THREE;  Surgeon: Ashok Pall, MD;  Location: [facility];  Service: Neurosurgery;  Laterality: N/A;  . Kyphoplasty N/A 12/09/2015    Procedure: Lumbar five, KYPHOPLASTY;  Surgeon: Ashok Pall, MD;  Location: [facility];  Service: Neurosurgery;  Laterality: N/A;  L5 kyphoplasty  . Kyphoplasty N/A 01/13/2016    Procedure: Thoracic seven KYPHOPLASTY;  Surgeon: Ashok Pall, MD;  Location: [facility];  Service: Neurosurgery;  Laterality: N/A;    Family History  Problem Relation Age of Onset  . Heart failure Mother   . Stroke Father   . Hypertension Brother   . Hypertension Brother   . Hypertension Sister   . Hypertension Sister   . Stroke Sister   . Stomach cancer      nephew  . Breast cancer      neice   Social History:  reports that she has never smoked. She has never used smokeless tobacco. She reports that she does not drink alcohol or use illicit drugs.  Allergies: No Known  Allergies  Medications Prior to Admission  Medication Sig Dispense Refill  . acetaminophen (TYLENOL) 500 MG tablet Take 1,000 mg by mouth every 6 (six) hours as needed (For pain.).    Marland Kitchen amLODipine (NORVASC) 5 MG tablet Take 1 tablet (5 mg total) by mouth daily. KEEP OV. 90 tablet 0  . aspirin EC 325 MG tablet Take 325 mg by mouth daily.    . calcium-vitamin D (OSCAL WITH D) 500-200 MG-UNIT per tablet Take 1 tablet by mouth daily at 12 noon.    . cholecalciferol (VITAMIN D) 1000 UNITS tablet Take 2,000 Units by mouth daily at 12 noon.     . diazepam (VALIUM) 2 MG tablet Take 1 tablet before MRI, and use as needed for anxiety. (Patient taking differently: Take 2 mg by mouth daily as needed (Take before MRI or for anxiety.). ) 5 tablet 0  . docusate sodium (COLACE) 100 MG capsule Take 100 mg by mouth 2  (two) times daily as needed for mild constipation.     . flecainide (TAMBOCOR) 50 MG tablet Take 0.5 tablets (25 mg total) by mouth 2 (two) times daily. 90 tablet 1  . ibuprofen (ADVIL,MOTRIN) 200 MG tablet Take 400 mg by mouth every 6 (six) hours as needed (For pain.).    Marland Kitchen levothyroxine (SYNTHROID, LEVOTHROID) 50 MCG tablet Take 50 mcg by mouth every morning.     Marland Kitchen losartan (COZAAR) 50 MG tablet Take 1 tablet by mouth in  the morning and 1/2 tablet  in the evening 135 tablet 6  . oxyCODONE-acetaminophen (PERCOCET/ROXICET) 5-325 MG tablet Take 1 tablet by mouth every 4 (four) hours as needed for moderate pain.     . traMADol (ULTRAM) 50 MG tablet Take 1 tablet (50 mg total) by mouth every 6 (six) hours as needed. (Patient taking differently: Take 50 mg by mouth every 6 (six) hours as needed (For pain.). ) 60 tablet 0  . amoxicillin (AMOXIL) 500 MG tablet Take 4 tablets (2gram) by mouth one hour prior to dental work. 20 tablet 0    No results found for this or any previous visit (from the past 48 hour(s)). No results found.  Review of Systems  HENT: Negative.   Eyes: Negative.   Respiratory: Negative.   Cardiovascular: Negative.   Gastrointestinal: Negative.   Genitourinary: Negative.   Musculoskeletal: Positive for back pain.  Skin: Negative.   Neurological: Positive for weakness.  Endo/Heme/Allergies: Negative.   Psychiatric/Behavioral: Negative.     Blood pressure 194/62, pulse 83, temperature 98.1 F (36.7 C), temperature source Oral, resp. rate 18, height 5\' 6"  (1.676 m), weight 51.88 kg (114 lb 6 oz), SpO2 98 %. Physical Exam  Constitutional: She is oriented to person, place, and time. She appears well-developed and well-nourished. She appears distressed.  HENT:  Head: Normocephalic and atraumatic.  Eyes: Conjunctivae and EOM are normal. Pupils are equal, round, and reactive to light.  Neck: Normal range of motion. Neck supple.  Cardiovascular: Normal rate, regular rhythm,  normal heart sounds and intact distal pulses.   Respiratory: Effort normal.  GI: Soft. Bowel sounds are normal.  Musculoskeletal: She exhibits tenderness.  Neurological: She is alert and oriented to person, place, and time. She has normal reflexes. She displays normal reflexes. No cranial nerve deficit. She exhibits normal muscle tone. Coordination normal.  Skin: Skin is warm and dry.  Psychiatric: She has a normal mood and affect. Her behavior is normal. Judgment and thought content normal.     Assessment/Plan OR for  Kyphoplasty  Sevana Grandinetti L, MD 01/22/2016, 11:26 AM

## 2016-01-22 NOTE — Anesthesia Postprocedure Evaluation (Signed)
Anesthesia Post Note  Patient: Yvonne [name]  Procedure(s) Performed: Procedure(s) (LRB): THORACIC EIGHT KYPHOPLASTY (N/A)  Patient location during evaluation: PACU Anesthesia Type: General Level of consciousness: awake and alert Pain management: pain level controlled Vital Signs Assessment: post-procedure vital signs reviewed and stable Respiratory status: spontaneous breathing, nonlabored ventilation, respiratory function stable and patient connected to nasal cannula oxygen Cardiovascular status: blood pressure returned to baseline and stable Postop Assessment: no signs of nausea or vomiting Anesthetic complications: no    Last Vitals:  Filed Vitals:   01/22/16 1329 01/22/16 1343  BP: 152/57 170/68  Pulse: 81 82  Temp:  36.4 C  Resp: 22 18    Last Pain:  Filed Vitals:   01/22/16 1451  PainSc: 6                  Shanik Brookshire L

## 2016-01-22 NOTE — Progress Notes (Signed)
Patient alert and oriented, mae's well, voiding adequate amount of urine, swallowing without difficulty, c/o mild pain. Patient discharged home with family. Script and discharged instructions given to patient. Patient and family stated understanding of d/c instructions given and has an appointment with MD.   

## 2016-01-22 NOTE — Anesthesia Procedure Notes (Signed)
Procedure Name: Intubation Date/Time: 01/22/2016 11:37 AM Performed by: Scheryl Darter Pre-anesthesia Checklist: Patient identified, Emergency Drugs available, Suction available and Patient being monitored Patient Re-evaluated:Patient Re-evaluated prior to inductionOxygen Delivery Method: Circle System Utilized Preoxygenation: Pre-oxygenation with 100% oxygen Intubation Type: IV induction Ventilation: Mask ventilation without difficulty Laryngoscope Size: Miller and 2 Grade View: Grade I Tube type: Oral Tube size: 7.0 mm Number of attempts: 1 Airway Equipment and Method: Stylet and Oral airway Placement Confirmation: ETT inserted through vocal cords under direct vision,  positive ETCO2 and breath sounds checked- equal and bilateral Secured at: 21 cm Tube secured with: Tape Dental Injury: Teeth and Oropharynx as per pre-operative assessment

## 2016-01-22 NOTE — Op Note (Signed)
01/22/2016  12:40 PM  PATIENT:  Yvonne [name]  82 y.o. female with a new T8 compression fracture after just having her T7 compression fracture less than two weeks ago.   PRE-OPERATIVE DIAGNOSIS:  Fracture of thoracic vertebra T8  POST-OPERATIVE DIAGNOSIS:  Fracture of thoracic vertebra T8  PROCEDURE:  Procedure(s): THORACIC EIGHT KYPHOPLASTY  SURGEON:  Surgeon(s): Ashok Pall, MD  ANESTHESIA:   general  EBL:   less than 5cc  BLOOD ADMINISTERED:none  COUNT:per nursing  SPECIMEN:  No Specimen  DICTATION: [name] was taken to the operating room, intubated and placed under a general anesthetic without difficulty. She was positioned prone on the operating room table with all pressure points properly padded. Her back was prepped and draped in a sterile manner. With fluoroscopy I localized the T8 pedicles bilaterally. I injected lidocaine into the entry sites on both the left and right sides. I started by making a stab incision on the right and left sides and entering the T8 pedicles with fluoroscopic guidance. Once good position was obtained, I drilled into the vertebral body. I then placed the kyphoplasty balloon into the T8 vertebra and inflated the balloon. I then inserted 4.5cc of methylmethacrylate into the vertebral body under fluoroscopic guidance. I achieved a very good midline fill of the cavity, staying within the confines of the vertebral body. I removed the instrumentation from the vertebral body, and the final films looked good. I closed the stab incision with vicryl suture and used Dermabond for a sterile dressing. Marland Kitchen    PLAN OF CARE: Admit to inpatient   PATIENT DISPOSITION:  PACU - hemodynamically stable.   Delay start of Pharmacological VTE agent (>24hrs) due to surgical blood loss or risk of bleeding:  yes

## 2016-01-22 NOTE — Discharge Instructions (Signed)
Wound Care °Leave incision open to air. °You may shower. °Do not scrub directly on incision.  °Do not put any creams, lotions, or ointments on incision. °Activity °Walk each and every day, increasing distance each day. °No lifting greater than 5 lbs.  Avoid bending, arching, and twisting. °No driving for 2 weeks; may ride as a passenger locally. °If provided with back brace, wear when out of bed.  It is not necessary to wear in bed. °Diet °Resume your normal diet.  °Return to Work °Will be discussed at you follow up appointment. °Call Your Doctor If Any of These Occur °Redness, drainage, or swelling at the wound.  °Temperature greater than 101 degrees. °Severe pain not relieved by pain medication. °Incision starts to come apart. °Follow Up Appt °Call today for appointment in 4 weeks ([phone]) or for problems.  If you have any hardware placed in your spine, you will need an x-ray before your appointment. °

## 2016-01-25 ENCOUNTER — Encounter (HOSPITAL_COMMUNITY): Payer: Self-pay | Admitting: Neurosurgery

## 2016-02-05 ENCOUNTER — Telehealth: Payer: Self-pay | Admitting: Family Medicine

## 2016-02-05 ENCOUNTER — Encounter: Payer: Self-pay | Admitting: General Practice

## 2016-02-05 NOTE — Telephone Encounter (Signed)
Spoke with patient, she advised that the GI provider (Dr. Loletha Carrow) advised her at the last visit that she would need an endoscopy referral to check on her pancreas. Please advise if ok and want me to use the DX Pancreatic ductal abnormality?

## 2016-02-05 NOTE — Telephone Encounter (Signed)
Daughter was instructed to f/u by phone with GI 2 weeks after her initial visit in early May to determine whether they needed to proceed w/ the study

## 2016-02-05 NOTE — Telephone Encounter (Signed)
mychart message sent to pt daughter to find out if this was done.

## 2016-02-05 NOTE — Telephone Encounter (Signed)
°  Relationship to patient: Self  Can be reached: [phone]    Reason for call: Patient request call back to discuss her last appointment. States she needs to inform provider what she has decided.

## 2016-02-09 NOTE — Telephone Encounter (Signed)
Patient requesting call back from [place].

## 2016-02-10 ENCOUNTER — Telehealth: Payer: Self-pay | Admitting: Family Medicine

## 2016-02-10 ENCOUNTER — Telehealth: Payer: Self-pay

## 2016-02-10 ENCOUNTER — Other Ambulatory Visit: Payer: Self-pay

## 2016-02-10 ENCOUNTER — Telehealth: Payer: Self-pay | Admitting: Gastroenterology

## 2016-02-10 DIAGNOSIS — R932 Abnormal findings on diagnostic imaging of liver and biliary tract: Secondary | ICD-10-CM

## 2016-02-10 DIAGNOSIS — K831 Obstruction of bile duct: Secondary | ICD-10-CM

## 2016-02-10 NOTE — Telephone Encounter (Signed)
Documented in already opened phone note.

## 2016-02-10 NOTE — Telephone Encounter (Signed)
Yvonne [name]'s phone number Pt's daughter [phone] best phone number to reach pt.

## 2016-02-10 NOTE — Telephone Encounter (Signed)
-----   Message from Milus Banister, MD sent at 02/10/2016  1:18 PM EDT ----- Mallie Mussel, EUS of CBD, pancreas is reasonable, along with good side viewing of the ampulla. Thanks.  Jakeira Seeman, See below. She needs upper EUS, radial +/- linear, ++MAC next available EUS Thursday.  Thanks   ----- Message -----    From: Doran Stabler, MD    Sent: 02/10/2016  12:03 PM      To: Milus Banister, MD  Linna Hoff,    When you get a minute, please review my recent office note and Abd MRI report on this patient to see if you agree that EUS reasonable to evaluate pancreas.  Looks like microcystic abnormality head panc with dilated CBD but nml LFTs.  Also, incidental and unrelated to her musculoskeletal pain that improved after kyphoplasty.  Nevertheless, I think imaging findings need workup.  Let me know, and if you agree I can have Omega Durante put her on your schedule.  Thanks - Mallie Mussel

## 2016-02-10 NOTE — Telephone Encounter (Signed)
Called and spoke with pt and she wanted to know the status of her endoscopy. Pt was advised that per PCP and Dr. Loletha Carrow last [place] note that the pt was to have a two week follow up. Pt states she was not aware of this. I advised that I had also sent a mychart message to inform her daughter. Pt is calling Dr. Corena Pilgrim office to set up this follow up to hopefully have endoscopy ordered.

## 2016-02-10 NOTE — Telephone Encounter (Signed)
EUS scheduled for 02/18/16 1115 am pt needs to be instructed

## 2016-02-10 NOTE — Telephone Encounter (Signed)
Pt states that the left sided pain is ongoing, maybe some better but not much.  She states 5/10 pain down from 7/10.  She is not taking as much pain medication, only 1(one) 50 mg tramadol every morning and 1 at night.  Per last office note: "We have asked her daughter to follow up with [place] by phone in 2 weeks to see as she is doing and we can make a decision about EUS. I will also giving some time to review the images with my partner Dr. Ardis Hughs, who performs the procedure, and see if he agrees that it is indicated."

## 2016-02-10 NOTE — Telephone Encounter (Signed)
Pt asking for Janett Billow to give her a call back. Pt states she has a question.

## 2016-02-11 ENCOUNTER — Encounter (HOSPITAL_COMMUNITY): Payer: Self-pay | Admitting: *Deleted

## 2016-02-11 NOTE — Telephone Encounter (Signed)
EUS scheduled, pt instructed and medications reviewed.  Patient instructions mailed to home.  Patient to call with any questions or concerns.  

## 2016-02-11 NOTE — Telephone Encounter (Signed)
Yes, thanks for checking in.  I had not forgotten about her, and in fact last week checked her chart to see that she has had two more kyphoplasty sessions, which accounts for her decreased pain.  Dr. Ardis Hughs was kind enough to review the reports and agrees that an EUS of the pancreas is appropriate.  He would like to make some time next week, and it appears he has already conveyed that plan to you.  Please find out from Brantley's daughter if they can make it to that procedure.  Strongly encourage to do so, since DJ's schedule for specialty procedures is quite busy.

## 2016-02-11 NOTE — Telephone Encounter (Signed)
Pt aware and has been instructed she will call with any further conerns

## 2016-02-18 ENCOUNTER — Ambulatory Visit (HOSPITAL_COMMUNITY): Payer: Medicare Other | Admitting: Anesthesiology

## 2016-02-18 ENCOUNTER — Encounter (HOSPITAL_COMMUNITY): Payer: Self-pay | Admitting: *Deleted

## 2016-02-18 ENCOUNTER — Ambulatory Visit (HOSPITAL_COMMUNITY)
Admission: RE | Admit: 2016-02-18 | Discharge: 2016-02-18 | Disposition: A | Discharge status: Home / Self Care | Payer: Medicare Other | Source: Ambulatory Visit | Attending: Gastroenterology | Admitting: Gastroenterology

## 2016-02-18 ENCOUNTER — Encounter (HOSPITAL_COMMUNITY)
Admission: RE | Disposition: A | Discharge status: Home / Self Care | Payer: Self-pay | Source: Ambulatory Visit | Attending: Gastroenterology

## 2016-02-18 DIAGNOSIS — K8020 Calculus of gallbladder without cholecystitis without obstruction: Secondary | ICD-10-CM | POA: Insufficient documentation

## 2016-02-18 DIAGNOSIS — I10 Essential (primary) hypertension: Secondary | ICD-10-CM | POA: Insufficient documentation

## 2016-02-18 DIAGNOSIS — Z79899 Other long term (current) drug therapy: Secondary | ICD-10-CM | POA: Insufficient documentation

## 2016-02-18 DIAGNOSIS — R932 Abnormal findings on diagnostic imaging of liver and biliary tract: Secondary | ICD-10-CM

## 2016-02-18 DIAGNOSIS — Z85828 Personal history of other malignant neoplasm of skin: Secondary | ICD-10-CM | POA: Diagnosis not present

## 2016-02-18 DIAGNOSIS — I4891 Unspecified atrial fibrillation: Secondary | ICD-10-CM | POA: Diagnosis not present

## 2016-02-18 DIAGNOSIS — K8689 Other specified diseases of pancreas: Secondary | ICD-10-CM | POA: Insufficient documentation

## 2016-02-18 DIAGNOSIS — I739 Peripheral vascular disease, unspecified: Secondary | ICD-10-CM | POA: Diagnosis not present

## 2016-02-18 DIAGNOSIS — E039 Hypothyroidism, unspecified: Secondary | ICD-10-CM | POA: Diagnosis not present

## 2016-02-18 DIAGNOSIS — Z9049 Acquired absence of other specified parts of digestive tract: Secondary | ICD-10-CM | POA: Insufficient documentation

## 2016-02-18 DIAGNOSIS — K831 Obstruction of bile duct: Secondary | ICD-10-CM

## 2016-02-18 DIAGNOSIS — Z7982 Long term (current) use of aspirin: Secondary | ICD-10-CM | POA: Insufficient documentation

## 2016-02-18 DIAGNOSIS — M810 Age-related osteoporosis without current pathological fracture: Secondary | ICD-10-CM | POA: Diagnosis not present

## 2016-02-18 DIAGNOSIS — Z96642 Presence of left artificial hip joint: Secondary | ICD-10-CM | POA: Insufficient documentation

## 2016-02-18 DIAGNOSIS — K838 Other specified diseases of biliary tract: Secondary | ICD-10-CM | POA: Diagnosis not present

## 2016-02-18 HISTORY — PX: EUS: SHX5427

## 2016-02-18 SURGERY — UPPER ENDOSCOPIC ULTRASOUND (EUS) LINEAR
Anesthesia: Monitor Anesthesia Care

## 2016-02-18 MED ORDER — PROPOFOL 10 MG/ML IV BOLUS
INTRAVENOUS | Status: DC | PRN
  Administered 2016-02-18: 20 mg via INTRAVENOUS
  Administered 2016-02-18: 10 mg via INTRAVENOUS
  Administered 2016-02-18 (×2): 20 mg via INTRAVENOUS

## 2016-02-18 MED ORDER — LIDOCAINE HCL (CARDIAC) 20 MG/ML IV SOLN
INTRAVENOUS | Status: AC
  Filled 2016-02-18: qty 5

## 2016-02-18 MED ORDER — PROPOFOL 500 MG/50ML IV EMUL
INTRAVENOUS | Status: DC | PRN
  Administered 2016-02-18: 100 ug/kg/min via INTRAVENOUS

## 2016-02-18 MED ORDER — PHENYLEPHRINE 40 MCG/ML (10ML) SYRINGE FOR IV PUSH (FOR BLOOD PRESSURE SUPPORT)
PREFILLED_SYRINGE | INTRAVENOUS | Status: AC
  Filled 2016-02-18: qty 10

## 2016-02-18 MED ORDER — PROPOFOL 10 MG/ML IV BOLUS
INTRAVENOUS | Status: AC
  Filled 2016-02-18: qty 40

## 2016-02-18 MED ORDER — SODIUM CHLORIDE 0.9 % IV SOLN: INTRAVENOUS | Status: DC

## 2016-02-18 MED ORDER — GLUCAGON HCL RDNA (DIAGNOSTIC) 1 MG IJ SOLR
INTRAMUSCULAR | Status: AC
  Filled 2016-02-18: qty 1

## 2016-02-18 MED ORDER — GLUCAGON HCL RDNA (DIAGNOSTIC) 1 MG IJ SOLR
INTRAMUSCULAR | Status: DC | PRN
  Administered 2016-02-18: .5 mg via INTRAVENOUS

## 2016-02-18 MED ORDER — LIDOCAINE 2% (20 MG/ML) 5 ML SYRINGE
INTRAMUSCULAR | Status: DC | PRN
  Administered 2016-02-18: 50 mg via INTRAVENOUS

## 2016-02-18 MED ORDER — LACTATED RINGERS IV SOLN
INTRAVENOUS | Status: DC
  Administered 2016-02-18: 11:00:00 via INTRAVENOUS

## 2016-02-18 MED ORDER — PHENYLEPHRINE HCL 10 MG/ML IJ SOLN
INTRAMUSCULAR | Status: DC | PRN
Start: 2016-02-18 — End: 2016-02-18
  Administered 2016-02-18: 80 ug via INTRAVENOUS

## 2016-02-18 NOTE — Anesthesia Postprocedure Evaluation (Signed)
Anesthesia Post Note  Patient: Yvonne [name]  Procedure(s) Performed: Procedure(s) (LRB): UPPER ENDOSCOPIC ULTRASOUND (EUS) LINEAR (N/A)  Patient location during evaluation: PACU Anesthesia Type: MAC Level of consciousness: awake and alert Pain management: pain level controlled Vital Signs Assessment: post-procedure vital signs reviewed and stable Respiratory status: spontaneous breathing, nonlabored ventilation, respiratory function stable and patient connected to nasal cannula oxygen Cardiovascular status: stable and blood pressure returned to baseline Anesthetic complications: no    Last Vitals:  Filed Vitals:   02/18/16 1133 02/18/16 1136  Pulse: 80   Temp:  36.4 C  Resp: 17     Last Pain: There were no vitals filed for this visit.               Kavontae Pritchard J

## 2016-02-18 NOTE — Op Note (Addendum)
[facility] Patient Name: Yvonne [name] Procedure Date: 02/18/2016 MRN: [id number] Attending MD: Milus Banister , MD Date of Birth: 1926-08-23 CSN: [id number] Age: 82 Admit Type: Outpatient Procedure:                Upper EUS Indications:              Common bile duct dilation (etiology unknown) seen                            on CT scan, Common bile duct dilation seen on MRCP,                            Dilated pancreatic duct on MRCP Providers:                Milus Banister, MD, Cleda Daub, RN, Zenon Mayo, RN, Ralene Bathe, Technician Referring MD:             Wilfrid Lund, MD Medicines:                Monitored Anesthesia Care Complications:            No immediate complications. Estimated blood loss:                            None. Estimated Blood Loss:     Estimated blood loss: none. Procedure:                Pre-Anesthesia Assessment:                           - Prior to the procedure, a History and Physical                            was performed, and patient medications and                            allergies were reviewed. The patient's tolerance of                            previous anesthesia was also reviewed. The risks                            and benefits of the procedure and the sedation                            options and risks were discussed with the patient.                            All questions were answered, and informed consent                            was obtained. Prior Anticoagulants: The patient has  taken no previous anticoagulant or antiplatelet                            agents. ASA Grade Assessment: II - A patient with                            mild systemic disease. After reviewing the risks                            and benefits, the patient was deemed in                            satisfactory condition to undergo the procedure.                           After  obtaining informed consent, the endoscope was                            passed under direct vision. Throughout the                            procedure, the patient's blood pressure, pulse, and                            oxygen saturations were monitored continuously. The                            [id number]) scope was introduced through                            the mouth, and advanced to the second part of                            duodenum. The upper EUS was accomplished without                            difficulty. The patient tolerated the procedure                            well. Scope In: Scope Out: Findings:      Endoscopic Finding :      1. The examined esophagus was endoscopically normal.      2. The entire examined stomach was endoscopically normal.      3. The examined duodenum was endoscopically normal, including good       visualization of the major papilla with side viewing duodenoscope.      Endosonographic Finding :      1. The pancreatic parenchyma was very atrophic, contained no discrete       masses or signs of chronic pancreatitis.      2. Gallbladder with shadowing gallstones.      3. CBD was slightly dilated (6.50mm maximally) but contained no soft       tissue mass or stones.      4. Main pancreatic duct was dilated up to 48mm in body, tail and it  tapered smoothly into the region of major papilla.      5. 1.2cm, reactive appearing periportal lymphnode.      6. Limited views of liver, spleen, portal and splenic vessels were all       normal. Impression:               Major papilla was normal (with side viewing                            duodenoscope). Pancreatic parenchyma was atrophic                            but no signs of masses in or near the pancreas. The                            CBD was only slightly dilated on this exam (6.12mm)                            and pancreatic duct was 39mm. Moderate Sedation:      N/A- Per Anesthesia  Care Recommendation:           - Discharge patient to home (ambulatory).                           - Observe clinically. She should have repeat LFTs                            in 6 weeks. Should try cutting back on tramadol as                            its #2 side effect is nausea. Procedure Code(s):        --- Professional ---                           [phone], Esophagogastroduodenoscopy, flexible,                            transoral; with endoscopic ultrasound examination                            limited to the esophagus, stomach or duodenum, and                            adjacent structures Diagnosis Code(s):        --- Professional ---                           K86.89, Other specified diseases of pancreas                           K83.8, Other specified diseases of biliary tract                           R93.2, Abnormal findings on diagnostic imaging of  liver and biliary tract CPT copyright 2016 [employer]. All rights reserved. The codes documented in this report are preliminary and upon coder review may  be revised to meet current compliance requirements. Milus Banister, MD 02/18/2016 11:37:22 AM This report has been signed electronically. Number of Addenda: 0

## 2016-02-18 NOTE — H&P (View-Only) (Signed)
Yvonne [name] is an 82 y.o. female.   Chief Complaint: T8 compression fracture causing back pain HPI: new T8 compression fracture with severe pain. No neurologic deficits  Past Medical History  Diagnosis Date  . Thoracic aortic aneurysm ([place]) 05/2010    Ruptured ascending  . History of atrial paroxysmal tachycardia   . History of hypothyroidism   . Compression fracture of L2 lumbar vertebra ([facility])   . Acute blood loss anemia   . Volume overload   . Hypertension   . Mitral valve prolapse   . Hypothyroidism   . Afib ([place])     -no   . History of blood transfusion   . Shortness of breath     with  exertion  . UTI (lower urinary tract infection)   . Arthritis   . Pericardial tamponade 05/2010  . Premature atrial contractions   . Syncope   . Heart murmur   . Moderate aortic insufficiency 05/25/2014  . Pneumonia     many years ago  . GERD (gastroesophageal reflux disease)     "mild"  . Cancer ([place])     skin cancer - arm squamous  . Osteoporosis     Past Surgical History  Procedure Laterality Date  . Revision of left total hip replacement using some allograft  11/23/2010  . Classic atrioventricular nodal reentrant  10/20/2010  . L2 kyphoplasty using kyphon system.  03/17/2009  . Replacement of ascending aorta and hemiarch and resuspension of the aortic valve with circulatory cardiopulmonary bypass and circulatory arrest  06/25/2010    Dr Servando Snare  . Ablation    . Tubal ligation  1966  . Tonsillectomy    . Appendectomy    . Hernia repair  1993    Right Inguinal  . Eye surgery Bilateral 2003    cataract bilateral  . Toe osteotomy      Right 2nd   . Percutaneous pinning wrist fracture Bilateral 1995    bil  . Breast surgery Right     right biospy  . Kyphoplasty  09/05/2012    Procedure: KYPHOPLASTY;  Surgeon: Winfield Cunas, MD;  Location: [facility];  Service: Neurosurgery;  Laterality: N/A;  L1 kyphoplasty  . Colonoscopy    . Kyphoplasty N/A 04/01/2014   Procedure: KYPHOPLASTY;  Surgeon: Ashok Pall, MD;  Location: [place] [facility];  Service: Neurosurgery;  Laterality: N/A;  Lumbar Four Kyphoplasty  . Kyphoplasty N/A 08/07/2015    Procedure: KYPHOPLASTY LUMBAR THREE;  Surgeon: Ashok Pall, MD;  Location: [place] [facility];  Service: Neurosurgery;  Laterality: N/A;  . Kyphoplasty N/A 12/09/2015    Procedure: Lumbar five, KYPHOPLASTY;  Surgeon: Ashok Pall, MD;  Location: [place] [facility];  Service: Neurosurgery;  Laterality: N/A;  L5 kyphoplasty  . Kyphoplasty N/A 01/13/2016    Procedure: Thoracic seven KYPHOPLASTY;  Surgeon: Ashok Pall, MD;  Location: [place] [facility];  Service: Neurosurgery;  Laterality: N/A;    Family History  Problem Relation Age of Onset  . Heart failure Mother   . Stroke Father   . Hypertension Brother   . Hypertension Brother   . Hypertension Sister   . Hypertension Sister   . Stroke Sister   . Stomach cancer      nephew  . Breast cancer      neice   Social History:  reports that she has never smoked. She has never used smokeless tobacco. She reports that she does not drink alcohol or use illicit drugs.  Allergies: No Known  Allergies  Medications Prior to Admission  Medication Sig Dispense Refill  . acetaminophen (TYLENOL) 500 MG tablet Take 1,000 mg by mouth every 6 (six) hours as needed (For pain.).    Marland Kitchen amLODipine (NORVASC) 5 MG tablet Take 1 tablet (5 mg total) by mouth daily. KEEP OV. 90 tablet 0  . aspirin EC 325 MG tablet Take 325 mg by mouth daily.    . calcium-vitamin D (OSCAL WITH D) 500-200 MG-UNIT per tablet Take 1 tablet by mouth daily at 12 noon.    . cholecalciferol (VITAMIN D) 1000 UNITS tablet Take 2,000 Units by mouth daily at 12 noon.     . diazepam (VALIUM) 2 MG tablet Take 1 tablet before MRI, and use as needed for anxiety. (Patient taking differently: Take 2 mg by mouth daily as needed (Take before MRI or for anxiety.). ) 5 tablet 0  . docusate sodium (COLACE) 100 MG capsule Take 100 mg by mouth 2  (two) times daily as needed for mild constipation.     . flecainide (TAMBOCOR) 50 MG tablet Take 0.5 tablets (25 mg total) by mouth 2 (two) times daily. 90 tablet 1  . ibuprofen (ADVIL,MOTRIN) 200 MG tablet Take 400 mg by mouth every 6 (six) hours as needed (For pain.).    Marland Kitchen levothyroxine (SYNTHROID, LEVOTHROID) 50 MCG tablet Take 50 mcg by mouth every morning.     Marland Kitchen losartan (COZAAR) 50 MG tablet Take 1 tablet by mouth in  the morning and 1/2 tablet  in the evening 135 tablet 6  . oxyCODONE-acetaminophen (PERCOCET/ROXICET) 5-325 MG tablet Take 1 tablet by mouth every 4 (four) hours as needed for moderate pain.     . traMADol (ULTRAM) 50 MG tablet Take 1 tablet (50 mg total) by mouth every 6 (six) hours as needed. (Patient taking differently: Take 50 mg by mouth every 6 (six) hours as needed (For pain.). ) 60 tablet 0  . amoxicillin (AMOXIL) 500 MG tablet Take 4 tablets (2gram) by mouth one hour prior to dental work. 20 tablet 0    No results found for this or any previous visit (from the past 48 hour(s)). No results found.  Review of Systems  HENT: Negative.   Eyes: Negative.   Respiratory: Negative.   Cardiovascular: Negative.   Gastrointestinal: Negative.   Genitourinary: Negative.   Musculoskeletal: Positive for back pain.  Skin: Negative.   Neurological: Positive for weakness.  Endo/Heme/Allergies: Negative.   Psychiatric/Behavioral: Negative.     Blood pressure 194/62, pulse 83, temperature 98.1 F (36.7 C), temperature source Oral, resp. rate 18, height 5\' 6"  (1.676 m), weight 51.88 kg (114 lb 6 oz), SpO2 98 %. Physical Exam  Constitutional: She is oriented to person, place, and time. She appears well-developed and well-nourished. She appears distressed.  HENT:  Head: Normocephalic and atraumatic.  Eyes: Conjunctivae and EOM are normal. Pupils are equal, round, and reactive to light.  Neck: Normal range of motion. Neck supple.  Cardiovascular: Normal rate, regular rhythm,  normal heart sounds and intact distal pulses.   Respiratory: Effort normal.  GI: Soft. Bowel sounds are normal.  Musculoskeletal: She exhibits tenderness.  Neurological: She is alert and oriented to person, place, and time. She has normal reflexes. She displays normal reflexes. No cranial nerve deficit. She exhibits normal muscle tone. Coordination normal.  Skin: Skin is warm and dry.  Psychiatric: She has a normal mood and affect. Her behavior is normal. Judgment and thought content normal.     Assessment/Plan OR for  Kyphoplasty  Chenita Ruda L, MD 01/22/2016, 11:26 AM

## 2016-02-18 NOTE — Anesthesia Preprocedure Evaluation (Signed)
Anesthesia Evaluation  Patient identified by MRN, date of birth, ID band Patient awake    Reviewed: Allergy & Precautions, NPO status , Patient's Chart, lab work & pertinent test results  Airway Mallampati: II  TM Distance: >3 FB Neck ROM: Full    Dental no notable dental hx.    Pulmonary pneumonia,    Pulmonary exam normal breath sounds clear to auscultation       Cardiovascular hypertension, + Peripheral Vascular Disease  Normal cardiovascular exam+ dysrhythmias + Valvular Problems/Murmurs  Rhythm:Regular Rate:Normal     Neuro/Psych negative neurological ROS  negative psych ROS   GI/Hepatic Neg liver ROS, GERD  ,  Endo/Other  Hypothyroidism   Renal/GU negative Renal ROS  negative genitourinary   Musculoskeletal  (+) Arthritis ,   Abdominal   Peds negative pediatric ROS (+)  Hematology  (+) anemia ,   Anesthesia Other Findings   Reproductive/Obstetrics negative OB ROS                             Anesthesia Physical Anesthesia Plan  ASA: III  Anesthesia Plan: MAC   Post-op Pain Management:    Induction: Intravenous  Airway Management Planned: Natural Airway  Additional Equipment:   Intra-op Plan:   Post-operative Plan:   Informed Consent: I have reviewed the patients History and Physical, chart, labs and discussed the procedure including the risks, benefits and alternatives for the proposed anesthesia with the patient or authorized representative who has indicated his/her understanding and acceptance.   Dental advisory given  Plan Discussed with: CRNA  Anesthesia Plan Comments:         Anesthesia Quick Evaluation

## 2016-02-18 NOTE — Transfer of Care (Signed)
Immediate Anesthesia Transfer of Care Note  Patient: Yvonne [name]  Procedure(s) Performed: Procedure(s): UPPER ENDOSCOPIC ULTRASOUND (EUS) LINEAR (N/A)  Patient Location: Endoscopy Unit  Anesthesia Type:MAC  Level of Consciousness: sedated  Airway & Oxygen Therapy: Patient Spontanous Breathing and Patient connected to nasal cannula oxygen  Post-op Assessment: Report given to RN and Post -op Vital signs reviewed and stable  Post vital signs: Reviewed and stable  Last Vitals:  Filed Vitals:   02/18/16 1021 02/18/16 1133  Pulse: 77 80  Temp: 36.7 C   Resp: 22 17    Last Pain: There were no vitals filed for this visit.       Complications: No apparent anesthesia complications

## 2016-02-18 NOTE — Interval H&P Note (Signed)
History and Physical Interval Note:  02/18/2016 10:45 AM  Yvonne [name]  has presented today for surgery, with the diagnosis of abn pancreas, dilated CBD   The various methods of treatment have been discussed with the patient and family. After consideration of risks, benefits and other options for treatment, the patient has consented to  Procedure(s): UPPER ENDOSCOPIC ULTRASOUND (EUS) LINEAR (N/A) as a surgical intervention .  The patient's history has been reviewed, patient examined, no change in status, stable for surgery.  I have reviewed the patient's chart and labs.  Questions were answered to the patient's satisfaction.     Milus Banister

## 2016-02-18 NOTE — Discharge Instructions (Signed)
Gastrointestinal Endoscopy, Care After °Refer to this sheet in the next few weeks. These instructions provide you with information on caring for yourself after your procedure. Your caregiver may also give you more specific instructions. Your treatment has been planned according to current medical practices, but problems sometimes occur. Call your caregiver if you have any problems or questions after your procedure. °HOME CARE INSTRUCTIONS °· If you were given medicine to help you relax (sedative), do not drive, operate machinery, or sign important documents for 24 hours. °· Avoid alcohol and hot or warm beverages for the first 24 hours after the procedure. °· Only take over-the-counter or prescription medicines for pain, discomfort, or fever as directed by your caregiver. You may resume taking your normal medicines unless your caregiver tells you otherwise. Ask your caregiver when you may resume taking medicines that may cause bleeding, such as aspirin, clopidogrel, or warfarin. °· You may return to your normal diet and activities on the day after your procedure, or as directed by your caregiver. Walking may help to reduce any bloated feeling in your abdomen. °· Drink enough fluids to keep your urine clear or pale yellow. °· You may gargle with salt water if you have a sore throat. °SEEK IMMEDIATE MEDICAL CARE IF: °· You have severe nausea or vomiting. °· You have severe abdominal pain, abdominal cramps that last longer than 6 hours, or abdominal swelling (distention). °· You have severe shoulder or back pain. °· You have trouble swallowing. °· You have shortness of breath, your breathing is shallow, or you are breathing faster than normal. °· You have a fever or a rapid heartbeat. °· You vomit blood or material that looks like coffee grounds. °· You have bloody, black, or tarry stools. °MAKE SURE YOU: °· Understand these instructions. °· Will watch your condition. °· Will get help right away if you are not doing  well or get worse. °  °This information is not intended to replace advice given to you by your health care provider. Make sure you discuss any questions you have with your health care provider. °  °Document Released: 03/29/2004 Document Revised: 09/05/2014 Document Reviewed: 11/15/2011 °Elsevier Interactive Patient Education ©2016 [employer] Inc[employer] ° °

## 2016-02-22 NOTE — Telephone Encounter (Signed)
Proceeded to try calling pt back 3 more times line was busy all 3 times. Encounter closed until pt is seen tomorrow.

## 2016-02-22 NOTE — Telephone Encounter (Signed)
Pt called in and scheduled an appt for tomorrow in regards to SOB. Pt denied being triaged to team health by the front desk. I tried calling pt to verify what was going on and to get more information on her symptoms and the phone line was busy. Will try calling back.

## 2016-02-23 ENCOUNTER — Encounter: Payer: Self-pay | Admitting: Family Medicine

## 2016-02-23 ENCOUNTER — Telehealth: Payer: Self-pay | Admitting: Family Medicine

## 2016-02-23 ENCOUNTER — Ambulatory Visit (INDEPENDENT_AMBULATORY_CARE_PROVIDER_SITE_OTHER): Payer: Medicare Other | Admitting: Family Medicine

## 2016-02-23 VITALS — BP 128/68 | HR 88 | Temp 97.8°F | Resp 17 | Ht 66.0 in | Wt 116.5 lb

## 2016-02-23 DIAGNOSIS — R0602 Shortness of breath: Secondary | ICD-10-CM | POA: Diagnosis not present

## 2016-02-23 DIAGNOSIS — R5383 Other fatigue: Secondary | ICD-10-CM | POA: Diagnosis not present

## 2016-02-23 LAB — BASIC METABOLIC PANEL
BUN: 23 mg/dL (ref 6–23)
CHLORIDE: 101 meq/L (ref 96–112)
CO2: 32 meq/L (ref 19–32)
Calcium: 9.9 mg/dL (ref 8.4–10.5)
Creatinine, Ser: 0.94 mg/dL (ref 0.40–1.20)
GFR: 59.78 mL/min — ABNORMAL LOW (ref >60.00)
GLUCOSE: 115 mg/dL — AB (ref 70–99)
POTASSIUM: 4.8 meq/L (ref 3.5–5.1)
Sodium: 139 mEq/L (ref 135–145)

## 2016-02-23 LAB — HEPATIC FUNCTION PANEL
ALT: 8 U/L (ref 0–35)
AST: 17 U/L (ref 0–37)
Albumin: 4.3 g/dL (ref 3.5–5.2)
Alkaline Phosphatase: 115 U/L (ref 39–117)
BILIRUBIN TOTAL: 1.1 mg/dL (ref 0.2–1.2)
Bilirubin, Direct: 0.2 mg/dL (ref 0.0–0.3)
TOTAL PROTEIN: 7.4 g/dL (ref 6.0–8.3)

## 2016-02-23 LAB — CBC WITH DIFFERENTIAL/PLATELET
BASOS ABS: 0 10*3/uL (ref 0.0–0.1)
Basophils Relative: 0.4 % (ref 0.0–3.0)
Eosinophils Absolute: 0.1 10*3/uL (ref 0.0–0.7)
Eosinophils Relative: 0.9 % (ref 0.0–5.0)
HEMATOCRIT: 42.3 % (ref 36.0–46.0)
Hemoglobin: 13.9 g/dL (ref 12.0–15.0)
LYMPHS PCT: 17.9 % (ref 12.0–46.0)
Lymphs Abs: 1.1 10*3/uL (ref 0.7–4.0)
MCHC: 33 g/dL (ref 30.0–36.0)
MCV: 91.3 fl (ref 78.0–100.0)
MONOS PCT: 6.2 % (ref 3.0–12.0)
Monocytes Absolute: 0.4 10*3/uL (ref 0.1–1.0)
NEUTROS PCT: 74.6 % (ref 43.0–77.0)
Neutro Abs: 4.7 10*3/uL (ref 1.4–7.7)
Platelets: 161 10*3/uL (ref 150.0–400.0)
RBC: 4.63 Mil/uL (ref 3.87–5.11)
RDW: 15.2 % (ref 11.5–15.5)
WBC: 6.3 10*3/uL (ref 4.0–10.5)

## 2016-02-23 LAB — TSH: TSH: 1.9 u[IU]/mL (ref 0.35–4.50)

## 2016-02-23 MED ORDER — TRAMADOL HCL 50 MG PO TABS: 50.0000 mg | ORAL_TABLET | Freq: Two times a day (BID) | ORAL | Status: DC | PRN

## 2016-02-23 NOTE — Progress Notes (Signed)
   Subjective:    Patient ID: Yvonne [name], female    DOB: 1925/11/15, 82 y.o.   MRN: [id number]  HPI SOB- 'i feel absolutely awful'.  Pt reports decreased appetite but 'they're making me eat'.  Pt reports she is not able to get a deep breath- 'it just cuts off'.  Decreased stamina.  No fevers/chills.  sxs improve throughout the day but again worsen at night.  Pt was previously taking Tramadol which improved her sleep.  sxs started after her 2 recent kyphoplasties.  Denies CP.  Called cardiology yesterday but they are out of office.  No swelling.  Pt reports she has had 'tickle in my throat' since her endoscopy.  Pt reports she is able to sleep through the night w/o breathing difficulties when taking Tramadol   Review of Systems For ROS see HPI     Objective:   Physical Exam  Constitutional: She is oriented to person, place, and time. No distress.  Frail, elderly woman  HENT:  Head: Normocephalic and atraumatic.  Neck: Normal range of motion. Neck supple. No thyromegaly present.  Cardiovascular: Normal rate, regular rhythm, normal heart sounds and intact distal pulses.   Pulmonary/Chest: Effort normal and breath sounds normal. She has no wheezes. She has no rales.  Pt was initially tachypnic but when she was able to calm down her rate was regular, effort was normal and lungs were CTAB  Abdominal: Soft. She exhibits no distension. There is tenderness (mild epigastric TTP). There is no rebound and no guarding.  Musculoskeletal: She exhibits no edema.  Lymphadenopathy:    She has no cervical adenopathy.  Neurological: She is alert and oriented to person, place, and time.  Skin: Skin is warm and dry.  Psychiatric: She has a normal mood and affect. Her behavior is normal. Thought content normal.  Vitals reviewed.         Assessment & Plan:

## 2016-02-23 NOTE — Assessment & Plan Note (Signed)
New.  Pt has severe protein calorie malnutrition which could be contributing to fatigue.  Hx of thyroid abnormality- will check labs.  She has recently had multiple back procedures for vertebral fxs which have also taken their toll.  Will attempt to r/o metabolic issues.  Discussed possibility of anxiety- pt adamantly refuses.  Will continue to follow.

## 2016-02-23 NOTE — Progress Notes (Signed)
Pre visit review using our clinic review tool, if applicable. No additional management support is needed unless otherwise documented below in the visit note. 

## 2016-02-23 NOTE — Telephone Encounter (Signed)
Patient states Dr. Francisca December had called her in a rx for Keflex for chronic, recurrent UTI.  However, he retracted it once the patient reported she was scheduled to see Dr. Birdie Riddle.  [facility]

## 2016-02-23 NOTE — Assessment & Plan Note (Signed)
New.  Pt reports she is unable to lie flat at night due to SOB.  When she takes Tramadol, she is able to breathe and sleep comfortably.  I suspect her SOB is due to back pain when lying down- which she endorses- and she become tachypnic, taking quick, shallow breaths due to her pain level and anxiety.  Lungs are CTAB and when we talk through her anxiety, her respiratory rate is normal.  She denies anxiety but appears very anxious in office.  EKG unchanged from previous, no evidence of volume overload on exam.  Check labs to r/o anemia, thyroid abnormality, electrolyte disturbance.  Encouraged her to take tramadol at night for pain.  Will refer to pulmonary for complete evaluation and arrange home health PT to work on her deconditioning.  Pt expressed understanding and is in agreement w/ plan.

## 2016-02-23 NOTE — Patient Instructions (Signed)
We will notify you of your lab results and make any changes if needed Please take the Tramadol nightly to improve pain and ease your breathing We'll call you with your pulmonary appt for the shortness of breath at night If you continue to feel badly, please schedule an appt w/ Dr C to ensure your heart is ok Make sure you continue to eat regularly to build your energy If you are willing to consider, home health physical therapy would be helpful to build your stamina Call with any questions or concerns Hang in there!!!

## 2016-02-24 NOTE — Telephone Encounter (Signed)
Patient notified of PCP recommendations and is agreement and expresses an understanding.  Pt states that she only takes this medication when her urine smells, states that this occurs only 2-3 times a year. Pt was informed that when this occurs she needs to be seen for treatment and evaluation.

## 2016-02-24 NOTE — Telephone Encounter (Signed)
I don't have any record of this from her previous doctor and I firmly believe that if she is having symptoms she will need to be evaluated rather than just taking medication.  Particularly given her age.

## 2016-02-25 ENCOUNTER — Telehealth: Payer: Self-pay | Admitting: General Practice

## 2016-02-25 NOTE — Telephone Encounter (Signed)
Dee from [facility] called in regards to pt, she wanted a verbal ok for PT x 8 more visits. Verbal ok given.

## 2016-02-25 NOTE — Telephone Encounter (Signed)
agree

## 2016-03-07 ENCOUNTER — Ambulatory Visit (INDEPENDENT_AMBULATORY_CARE_PROVIDER_SITE_OTHER)
Admission: RE | Admit: 2016-03-07 | Discharge: 2016-03-07 | Disposition: A | Discharge status: Home / Self Care | Payer: Medicare Other | Source: Ambulatory Visit | Attending: Internal Medicine | Admitting: Internal Medicine

## 2016-03-07 ENCOUNTER — Ambulatory Visit (INDEPENDENT_AMBULATORY_CARE_PROVIDER_SITE_OTHER): Payer: Medicare Other | Admitting: Internal Medicine

## 2016-03-07 ENCOUNTER — Encounter: Payer: Self-pay | Admitting: Internal Medicine

## 2016-03-07 VITALS — BP 116/58 | HR 86 | Ht 66.0 in | Wt 120.0 lb

## 2016-03-07 DIAGNOSIS — R05 Cough: Secondary | ICD-10-CM

## 2016-03-07 DIAGNOSIS — R058 Other specified cough: Secondary | ICD-10-CM

## 2016-03-07 DIAGNOSIS — R0602 Shortness of breath: Secondary | ICD-10-CM | POA: Diagnosis not present

## 2016-03-07 DIAGNOSIS — Z7982 Long term (current) use of aspirin: Secondary | ICD-10-CM

## 2016-03-07 DIAGNOSIS — R5383 Other fatigue: Secondary | ICD-10-CM | POA: Diagnosis not present

## 2016-03-07 DIAGNOSIS — M6281 Muscle weakness (generalized): Secondary | ICD-10-CM | POA: Diagnosis not present

## 2016-03-07 DIAGNOSIS — I10 Essential (primary) hypertension: Secondary | ICD-10-CM | POA: Diagnosis not present

## 2016-03-07 DIAGNOSIS — E46 Unspecified protein-calorie malnutrition: Secondary | ICD-10-CM

## 2016-03-07 DIAGNOSIS — Z9181 History of falling: Secondary | ICD-10-CM

## 2016-03-07 DIAGNOSIS — R2689 Other abnormalities of gait and mobility: Secondary | ICD-10-CM

## 2016-03-07 NOTE — Progress Notes (Signed)
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 presenting as abd pain rad tp bacl  worse with deep breath  > Kyphoplasty May 17th > no better pain > dx T8 fx > Kyhopalsty >> May 26th no better > Dr Aretha Parrot eval June 1st > pain gradually improved 90% improved still on Tramadol 50 mg one half and hs > sleeping ok but when trying to resume nl activities very  poor stamina/ fatigue but not necessarily sob referred to pulmonary clinic 03/07/2016 by Dr Birdie Riddle    03/07/2016 1st [facility] office visit/ Deniese Oberry   Chief Complaint  Patient presents with  . Pulmonary Consult    Referred by Dr. Annye Asa. Pt c/o DOE since had kyphoplasty surgery 01/22/16.   has been intubated 4 x this year with increase hoarseness and sense of pnds day > noct / freq throat clearing and not on gerd rx   With sense of sob "with any activity" but also at rest when coughing   No obvious day to day or daytime variability or assoc excess/ purulent sputum or mucus plugs or hemoptysis or cp or chest tightness, subjective wheeze or overt sinus or hb symptoms. No unusual exp hx or h/o childhood pna/ asthma or knowledge of premature birth.  Sleeping ok without nocturnal  or early am exacerbation  of respiratory  c/o's or need for noct saba. Also denies any obvious fluctuation of symptoms with weather or environmental changes or other aggravating or alleviating factors except as outlined above   Current Medications, Allergies, Complete Past Medical History, Past Surgical History, Family History, and Social History were reviewed in [employer] record.  ROS  The following are not active complaints unless bolded sore throat, dysphagia, dental problems, itching, sneezing,  nasal congestion or excess/ purulent secretions, ear ache,    fever, chills, sweats, unintended wt loss, classically pleuritic or exertional cp,  orthopnea pnd or leg swelling, presyncope, palpitations, abdominal pain, anorexia, nausea, vomiting, diarrhea  or change in bowel or bladder habits, change in stools or urine, dysuria,hematuria,  rash, arthralgias, visual complaints, headache, numbness, weakness or ataxia or problems with walking or coordination,  change in mood/affect or memory.            Review of Systems     Objective:   Physical Exam    amb hoarse wf nad/ freq throat clearing   Wt Readings from Last 3 Encounters:  03/07/16 120 lb (54.432 kg)  02/23/16 116 lb 8 oz (52.844 kg)  02/18/16 112 lb (50.803 kg)    Vital signs reviewed   HEENT: nl dentition, turbinates, and oropharynx. Nl external ear canals without cough reflex   NECK :  without JVD/Nodes/TM/ nl carotid upstrokes bilaterally   LUNGS: no acc muscle use,  Nl contour chest which is clear to A and P bilaterally without cough on insp or exp maneuvers   CV:  RRR  no s3 or murmur or increase in P2, no edema   ABD:  soft and nontender with nl inspiratory excursion in the supine position. No bruits or organomegaly, bowel sounds nl  MS:  Nl gait/ ext warm without deformities, calf tenderness, cyanosis or clubbing No obvious joint restrictions   SKIN: warm and dry without lesions    NEURO:  alert, approp, nl sensorium with  no motor deficits   CXR PA and Lateral:   03/07/2016 :    I personally reviewed images and agree with radiology impression as follows:    Hyperinflation/ COPD.  No active disease.    Labs ordered/ reviewed:      Chemistry      Component Value Date/Time   NA 139 02/23/2016 1201   K 4.8 02/23/2016 1201   CL 101 02/23/2016 1201   CO2 32 02/23/2016 1201   BUN 23 02/23/2016 1201   CREATININE 0.94 02/23/2016 1201   CREATININE 0.96* 07/10/2015 1350      Component Value Date/Time   CALCIUM 9.9 02/23/2016 1201   ALKPHOS 115 02/23/2016  1201   AST 17 02/23/2016 1201   ALT 8 02/23/2016 1201   BILITOT 1.1 02/23/2016 1201        Lab Results  Component Value Date   WBC 6.3 02/23/2016   HGB 13.9 02/23/2016   HCT 42.3 02/23/2016   MCV 91.3 02/23/2016   PLT 161.0 02/23/2016         Lab Results  Component Value Date   TSH 1.90 02/23/2016          Assessment:

## 2016-03-07 NOTE — Patient Instructions (Signed)
Try prilosec otc 20mg   Take 30-60 min before first meal of the day and Pepcid ac (famotidine) 20 mg one @  bedtime until cough is completely gone for at least a week without the need for cough suppression     GERD (REFLUX)  is an extremely common cause of respiratory symptoms just like yours , many times with no obvious heartburn at all.    It can be treated with medication, but also with lifestyle changes including elevation of the head of your bed (ideally with 6 inch  bed blocks),  Smoking cessation, avoidance of late meals, excessive alcohol, and avoid fatty foods, chocolate, peppermint, colas, red wine, and acidic juices such as orange juice.  NO MINT OR MENTHOL PRODUCTS SO NO COUGH DROPS  USE SUGARLESS CANDY INSTEAD (Jolley ranchers or Stover's or Life Savers) or even ice chips will also do - the key is to swallow to prevent all throat clearing. NO OIL BASED VITAMINS - use powdered substitutes.    Please remember to go to the   x-ray department downstairs for your tests - we will call you with the results when they are available.

## 2016-03-09 NOTE — Progress Notes (Signed)
Quick Note:  Spoke with pt and notified of results per Dr. Wert. Pt verbalized understanding and denied any questions.  ______ 

## 2016-03-11 ENCOUNTER — Other Ambulatory Visit: Payer: Self-pay | Admitting: Cardiovascular Disease

## 2016-03-11 NOTE — Telephone Encounter (Signed)
Rx has been sent to the pharmacy electronically. ° °

## 2016-03-12 ENCOUNTER — Encounter: Payer: Self-pay | Admitting: Internal Medicine

## 2016-03-12 NOTE — Assessment & Plan Note (Addendum)
03/07/2016  Walked RA x 3 laps @ 185 ft each stopped due to end of study, brisk pace, not sob or desat  > trial of GERD rx   Not able to reproduce her "constant sense of sob" here in office so Symptoms are markedly disproportionate to objective findings and not clear this is a lung problem but pt does appear to have difficult airway management issues best characterized by the Upper airway cough syndrome  DDX of  difficult airways management almost all start with A and  include Adherence, Ace Inhibitors, Acid Reflux, Active Sinus Disease, Alpha 1 Antitripsin deficiency, Anxiety masquerading as Airways dz,  ABPA,  Allergy(esp in young), Aspiration (esp in elderly), Adverse effects of meds,  Active smokers, A bunch of PE's (a small clot burden can't cause this syndrome unless there is already severe underlying pulm or vascular dz with poor reserve) plus two Bs  = Bronchiectasis and Beta blocker use..and one C= CHF   Adherence is always the initial "prime suspect" and is a multilayered concern that requires a "trust but verify" approach in every patient - starting with knowing how to use medications, especially inhalers, correctly, keeping up with refills and understanding the fundamental difference between maintenance and prns vs those medications only taken for a very short course and then stopped and not refilled.   ? Acid (or non-acid) GERD > always difficult to exclude as up to 75% of pts in some series report no assoc GI/ Heartburn symptoms- see comments below re ET and gerd > rec max (24h)  acid suppression and diet restrictions/ reviewed and instructions given in writing.   ? Anxiety > dx of exclusion   ? A bunch of PE's > very unlikely given walking study today  ? CHF > echo reviewed 02/06/15 and ok with nothing to suggest change   For now rec max rx for gerd only  NB Of the three most common causes of chronic cough, only one (GERD)  can actually cause the other two (asthma and post nasal  drip syndrome)  and perpetuate the cylce of cough inducing airway trauma, inflammation, heightened sensitivity to reflux which is prompted by the cough itself via a cyclical mechanism.    This may partially respond to steroids and look like asthma and post nasal drainage but never erradicated completely unless the cough and the secondary reflux are eliminated, preferably both at the same time.  While not intuitively obvious, many patients with chronic low grade reflux do not cough until there is a secondary insult that disturbs the protective epithelial barrier and exposes sensitive nerve endings.  This can be viral or direct physical injury such as with an endotracheal tube.   The point is that once this occurs, it is difficult to eliminate using anything but a maximally effective acid suppression regimen at least in the short run, accompanied by an appropriate diet to address non acid GERD and efforts by pt to stop throat clearing by using non mint  And non menthol lozenges   Total time devoted to counseling  = 35/71m review case with pt/ discussion of options/alternatives/ personally creating written instructions  in presence of pt  then going over those specific  Instructions directly with the pt including how to use all of the meds but in particular covering each new medication in detail and the difference between the maintenance/automatic meds and the prns using an action plan format for the latter.  Pulmonary f/u can be prn

## 2016-03-14 ENCOUNTER — Encounter: Payer: Self-pay | Admitting: Family Medicine

## 2016-03-14 ENCOUNTER — Ambulatory Visit (INDEPENDENT_AMBULATORY_CARE_PROVIDER_SITE_OTHER): Payer: Medicare Other | Admitting: Family Medicine

## 2016-03-14 VITALS — BP 130/80 | HR 100 | Temp 98.3°F | Resp 16 | Ht 66.0 in | Wt 119.5 lb

## 2016-03-14 DIAGNOSIS — R82998 Other abnormal findings in urine: Secondary | ICD-10-CM

## 2016-03-14 DIAGNOSIS — R8290 Unspecified abnormal findings in urine: Secondary | ICD-10-CM | POA: Diagnosis not present

## 2016-03-14 DIAGNOSIS — N390 Urinary tract infection, site not specified: Secondary | ICD-10-CM

## 2016-03-14 DIAGNOSIS — R319 Hematuria, unspecified: Secondary | ICD-10-CM | POA: Diagnosis not present

## 2016-03-14 LAB — POCT URINALYSIS DIPSTICK
BILIRUBIN UA: NEGATIVE
GLUCOSE UA: NEGATIVE
Ketones, UA: NEGATIVE
NITRITE UA: NEGATIVE
Protein, UA: NEGATIVE
UROBILINOGEN UA: 0.2
pH, UA: 6.5

## 2016-03-14 MED ORDER — CEPHALEXIN 500 MG PO CAPS: 500.0000 mg | ORAL_CAPSULE | Freq: Two times a day (BID) | ORAL | Status: AC

## 2016-03-14 NOTE — Patient Instructions (Signed)
Follow up as needed We'll notify you of your culture results and make any medication changes if needed Drink plenty of fluids Call with any questions or concerns Hang in there!!!

## 2016-03-14 NOTE — Progress Notes (Signed)
Pre visit review using our clinic review tool, if applicable. No additional management support is needed unless otherwise documented below in the visit note. 

## 2016-03-14 NOTE — Progress Notes (Signed)
   Subjective:    Patient ID: EVYN DENNEY, female    DOB: 1926/04/10, 82 y.o.   MRN: [id number]  HPI Urine odor- 'i just have a chronic UTI'.  'keflex is the only thing that seems to do any good'.  Pt denies sxs w/ exception of 'stinky urine'.  Pt reports increased odor x1 weeks.  No burning w/ urination, frequency or urgency.  No fevers.  No pelvic pain/pressure.   Review of Systems For ROS see HPI     Objective:   Physical Exam  Constitutional: She is oriented to person, place, and time. She appears well-developed and well-nourished. No distress.  Abdominal: Soft. She exhibits no distension. There is no tenderness (no suprapubic or CVA tenderness).  Neurological: She is alert and oriented to person, place, and time.  Skin: Skin is warm and dry.  Psychiatric: She has a normal mood and affect. Her behavior is normal. Thought content normal.  Vitals reviewed.         Assessment & Plan:  Abnormal urinary odor- pt has hx of chronic UTI.  She states only sx is abnormal odor.  Denies all other sxs.  UA + for trace blood and leuks.  Start Keflex.  Will adjust meds based on results of cx.  Pt expressed understanding and is in agreement w/ plan.

## 2016-03-17 ENCOUNTER — Other Ambulatory Visit: Payer: Self-pay | Admitting: General Practice

## 2016-03-17 ENCOUNTER — Other Ambulatory Visit: Payer: Self-pay | Admitting: Cardiovascular Disease

## 2016-03-17 ENCOUNTER — Ambulatory Visit: Payer: Medicare Other | Admitting: Gastroenterology

## 2016-03-17 LAB — URINE CULTURE

## 2016-03-17 MED ORDER — CIPROFLOXACIN HCL 500 MG PO TABS: 500.0000 mg | ORAL_TABLET | Freq: Two times a day (BID) | ORAL | Status: DC

## 2016-05-05 ENCOUNTER — Telehealth: Payer: Self-pay | Admitting: Family Medicine

## 2016-05-05 MED ORDER — LEVOTHYROXINE SODIUM 50 MCG PO TABS: 50.0000 ug | ORAL_TABLET | Freq: Every morning | ORAL | 1 refills | Status: DC

## 2016-05-05 NOTE — Telephone Encounter (Signed)
Pt and informed ok to switch medication. This was filled to mail order as requested.

## 2016-05-05 NOTE — Telephone Encounter (Signed)
Patient calling to request to speak with Janett Billow regarding getting a refill of levothyroxine (SYNTHROID, LEVOTHROID) 50 MCG tablet sent to [facility].  Patient states she recently discussed with pcp about switching to the generic form.  Please call back to discuss of still ok to switch to generic Synthroid.

## 2016-06-21 ENCOUNTER — Other Ambulatory Visit: Payer: Self-pay | Admitting: *Deleted

## 2016-06-21 DIAGNOSIS — I712 Thoracic aortic aneurysm, without rupture, unspecified: Secondary | ICD-10-CM

## 2016-06-23 ENCOUNTER — Ambulatory Visit (INDEPENDENT_AMBULATORY_CARE_PROVIDER_SITE_OTHER): Payer: Medicare Other | Admitting: Cardiovascular Disease

## 2016-06-23 ENCOUNTER — Encounter: Payer: Self-pay | Admitting: Cardiovascular Disease

## 2016-06-23 VITALS — BP 110/50 | HR 75 | Ht 66.0 in | Wt 119.0 lb

## 2016-06-23 DIAGNOSIS — I471 Supraventricular tachycardia: Secondary | ICD-10-CM

## 2016-06-23 DIAGNOSIS — I10 Essential (primary) hypertension: Secondary | ICD-10-CM

## 2016-06-23 DIAGNOSIS — I351 Nonrheumatic aortic (valve) insufficiency: Secondary | ICD-10-CM

## 2016-06-23 DIAGNOSIS — I712 Thoracic aortic aneurysm, without rupture, unspecified: Secondary | ICD-10-CM

## 2016-06-23 DIAGNOSIS — I341 Nonrheumatic mitral (valve) prolapse: Secondary | ICD-10-CM

## 2016-06-23 DIAGNOSIS — I441 Atrioventricular block, second degree: Secondary | ICD-10-CM

## 2016-06-23 NOTE — Progress Notes (Signed)
Cardiology Office Note    Date:  06/24/2016   ID:  Yvonne [name], DOB [birth year], MRN [id number]  PCP:  Annye Asa, MD  Cardiologist:   Sanda Klein, MD   Chief complaint: Follow-up aortic aneurysm, arrhythmia, hypertension  History of Present Illness:  Yvonne [name] is a 82 y.o. female with a history of repaired rupture of the ascending aortic aneurysm, paroxysmal atrial tachycardia, mitral valve prolapse and second-degree AV block Mobitz type I.  She has not had any cardiac problems in the last 12 months, but has required repeated interventions for thoracic vertebral compression fractions. She had to kyphoplasty is in April. Back pain has been her biggest problem. She has not been troubled by palpitations, dyspnea or chest pain. She has not had syncope or near syncope.  In 2011 she had surgical repair of a ruptured ascending aortic aneurysm and resuspension of the native aortic valve. 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 Followed by Dr. Ceasar Mons. She had problems with paroxysmal atrial tachycardia and had a successful ablation by Dr. Thompson Grayer. Following that she was still troubled by frequent symptomatic PACs and continues to take treatment with flecainide. She has excellent symptom relief. Beta blockers had to be discontinued after she develops symptomatic second degree Mobitz type I AV block that led to syncope. Hypertension is controlled on a combination of ARB and calcium channel blocker. She has not had atrial fibrillation.  Past Medical History:  Diagnosis Date  . Acute blood loss anemia 2011   WITH ANEURYSM ONLY  . Afib ([place])    -no   . Arthritis   . Cancer ([place]) LATE 1970'S   skin cancer - arm squamous  . Compression fracture of L2 lumbar vertebra ([facility])   . GERD (gastroesophageal reflux disease)    "mild"  . Heart murmur   . History of atrial  paroxysmal tachycardia   . History of blood transfusion   . History of hypothyroidism   . Hypertension   . Hypothyroidism   . Mitral valve prolapse   . Moderate aortic insufficiency 05/25/2014  . Osteoporosis   . Pericardial tamponade 05/2010  . Pneumonia    many years ago  . Premature atrial contractions   . Syncope    VERY RARE  . Thoracic aortic aneurysm ([place]) 05/2010   Ruptured ascending  . UTI (lower urinary tract infection)    CHRONIC  . Volume overload 2011    Past Surgical History:  Procedure Laterality Date  . ABLATION    . APPENDECTOMY    . BREAST SURGERY Right    right biospy  . Classic atrioventricular nodal reentrant  10/20/2010  . COLONOSCOPY    . EUS N/A 02/18/2016   Procedure: UPPER ENDOSCOPIC ULTRASOUND (EUS) LINEAR;  Surgeon: Milus Banister, MD;  Location: [facility];  Service: Endoscopy;  Laterality: N/A;  . EYE SURGERY Bilateral 2003   cataract bilateral  . HERNIA REPAIR  1993   Right Inguinal  . KYPHOPLASTY  09/05/2012   Procedure: KYPHOPLASTY;  Surgeon: Winfield Cunas, MD;  Location: [facility];  Service: Neurosurgery;  Laterality: N/A;  L1 kyphoplasty  . KYPHOPLASTY N/A 04/01/2014   Procedure: KYPHOPLASTY;  Surgeon: Ashok Pall, MD;  Location: [facility];  Service: Neurosurgery;  Laterality: N/A;  Lumbar Four Kyphoplasty  . KYPHOPLASTY N/A 08/07/2015   Procedure: KYPHOPLASTY LUMBAR THREE;  Surgeon: Ashok Pall, MD;  Location: [facility];  Service: Neurosurgery;  Laterality: N/A;  . KYPHOPLASTY N/A 12/09/2015   Procedure: Lumbar five, KYPHOPLASTY;  Surgeon: Ashok Pall, MD;  Location: [facility];  Service: Neurosurgery;  Laterality: N/A;  L5 kyphoplasty  . KYPHOPLASTY N/A 01/13/2016   Procedure: Thoracic seven KYPHOPLASTY;  Surgeon: Ashok Pall, MD;  Location: [facility];  Service: Neurosurgery;  Laterality: N/A;  . KYPHOPLASTY N/A 01/22/2016   Procedure: THORACIC EIGHT KYPHOPLASTY;  Surgeon: Ashok Pall, MD;  Location: [facility];   Service: Neurosurgery;  Laterality: N/A;  T8 KYPHOPLASTY  . L2 kyphoplasty using Kyphon system.  03/17/2009  . PERCUTANEOUS PINNING WRIST FRACTURE Bilateral 1995   bil  . Replacement of ascending aorta and hemiarch and resuspension of the aortic valve with circulatory cardiopulmonary bypass and circulatory arrest  06/25/2010   Dr Servando Snare  . Revision of left total hip replacement using some allograft  11/23/2010  . TOE OSTEOTOMY     Right 2nd   . TONSILLECTOMY    . TUBAL LIGATION  1966    Current Medications: Outpatient Medications Prior to Visit  Medication Sig Dispense Refill  . acetaminophen (TYLENOL) 500 MG tablet Take 1,000 mg by mouth every 6 (six) hours as needed (For pain.).    Marland Kitchen amLODipine (NORVASC) 5 MG tablet Take 1 tablet by mouth  daily 90 tablet 1  . amoxicillin (AMOXIL) 500 MG tablet Take 4 tablets (2gram) by mouth one hour prior to dental work. 20 tablet 0  . aspirin EC 325 MG tablet Take 325 mg by mouth daily.    . calcium-vitamin D (OSCAL WITH D) 500-200 MG-UNIT per tablet Take 1 tablet by mouth daily at 12 noon.    . cholecalciferol (VITAMIN D) 1000 UNITS tablet Take 2,000 Units by mouth daily at 12 noon.     . ciprofloxacin (CIPRO) 500 MG tablet Take 1 tablet (500 mg total) by mouth 2 (two) times daily. 6 tablet 0  . docusate sodium (COLACE) 100 MG capsule Take 100 mg by mouth 2 (two) times daily as needed for mild constipation.     . flecainide (TAMBOCOR) 50 MG tablet Take one-half tablet by  mouth two times daily 90 tablet 2  . ibuprofen (ADVIL,MOTRIN) 200 MG tablet Take 400 mg by mouth every 6 (six) hours as needed (For pain.).    Marland Kitchen levothyroxine (SYNTHROID, LEVOTHROID) 50 MCG tablet Take 1 tablet (50 mcg total) by mouth every morning. 90 tablet 1  . losartan (COZAAR) 50 MG tablet Take 1 tablet by mouth in  the morning and 1/2 tablet  in the evening 135 tablet 6  . methocarbamol (ROBAXIN) 500 MG tablet Take 1 tablet by mouth 4 (four) times daily as needed.  0  .  oxyCODONE-acetaminophen (PERCOCET/ROXICET) 5-325 MG tablet Take 1 tablet by mouth every 4 (four) hours as needed for moderate pain.     . traMADol (ULTRAM) 50 MG tablet Take 1 tablet (50 mg total) by mouth every 12 (twelve) hours as needed. 60 tablet 0   No facility-administered medications prior to visit.      Allergies:   Review of patient's allergies indicates no known allergies.   Social History   Social History  . Marital status: Widowed    Spouse name: N/A  . Number of children: N/A  . Years of education: N/A   Social History Main Topics  . Smoking status: Never Smoker  . Smokeless tobacco: Never Used  . Alcohol use No  . Drug use: No  .  Sexual activity: Not Asked   Other Topics Concern  . None   Social History Narrative  . None     Family History:  The patient's family history includes COPD in her mother; Heart failure in her mother; Hypertension in her brother, brother, sister, and sister; Stroke in her father and sister. She is one of 3 siblings from a group of 9 siblings that have aneurysms of the thoracic aorta. Her children have all had scans that did not show evidence of aneurysm.  ROS:   Please see the history of present illness.    ROS All other systems reviewed and are negative.   PHYSICAL EXAM:   VS:  BP (!) 110/50   Pulse 75   Ht 5\' 6"  (1.676 m)   Wt 119 lb (54 kg)   BMI 19.21 kg/m    GEN: Very slender and somewhat frail-appearing, well developed, in no acute distress  HEENT: normal  Neck: no JVD, carotid bruits, or masses Cardiac: RRR; 1-2/6 early peaking aortic ejection murmur, 1/6 decrescendo diastolic aortic murmur,  no rubs,  Gallops or edema  Respiratory:  clear to auscultation bilaterally, normal work of breathing GI: soft, nontender, nondistended, + BS MS: no deformity or atrophy  Skin: warm and dry, no rash Neuro:  Alert and Oriented x 3, Strength and sensation are intact Psych: euthymic mood, full affect  Wt Readings from Last 3  Encounters:  06/23/16 119 lb (54 kg)  03/14/16 119 lb 8 oz (54.2 kg)  03/07/16 120 lb (54.4 kg)      Studies/Labs Reviewed:   EKG:  EKG is ordered today.  The ekg ordered today demonstrates Sinus rhythm with PACs. LVH with secondary ST-T changes, QTC 455 ms  Recent Labs: 02/23/2016: ALT 8; BUN 23; Creatinine, Ser 0.94; Hemoglobin 13.9; Platelets 161.0; Potassium 4.8; Sodium 139; TSH 1.90    ASSESSMENT:    1. SVT (supraventricular tachycardia) ([place])   2. Second degree AV block   3. Thoracic aortic aneurysm without rupture ([place])   4. Essential hypertension   5. MVP (mitral valve prolapse)   6. Nonrheumatic aortic valve insufficiency      PLAN:  In order of problems listed above:  1. SVT/PACs: Flecainide has offered excellent symptom relief and she wishes to continue this. We have discussed potential toxicity of this drug in her age group but so far no symptoms or ECG signs of flecainide toxicity. 2. 2nd deg AVB: This has not been seen following discontinuation of beta blockers. No bradycardia or syncope since we discontinued those medications. 3. Ao aneurysm: Likely to be due to a genetic mutation since 3 of 9 siblings have aortic aneurysms. Her children have all been screened and have not had evidence of aneurysms (they are in their late 37s and 98s). It seems that in her family the development of aneurysms has occurred at advanced ages (48s and 34s) and they may require another screening test in about 5-10 years. 4. HTN: Excellent control. Unable to take beta blockers due to symptomatic bradycardia. 5. MVP: Seen on echocardiogram but not associated with significant mitral insufficiency or symptoms (unless her PACs are somehow related to this). Trivial mitral insufficiency by echo in June 2016 6. AI: Secondary to aorto annular ectasia and aortic valve resuspension surgery. Previously described as moderate. Most recent echo from June 2016 described the aortic insufficiency as  mild.    Medication Adjustments/Labs and Tests Ordered: Current medicines are reviewed at length with the patient today.  Concerns regarding  medicines are outlined above.  Medication changes, Labs and Tests ordered today are listed in the Patient Instructions below. Patient Instructions  Dr Sallyanne Kuster recommends that you schedule a follow-up appointment in 1 year. You will receive a reminder letter in the mail two months in advance. If you don't receive a letter, please call our office to schedule the follow-up appointment.  If you need a refill on your cardiac medications before your next appointment, please call your pharmacy.    Signed, Sanda Klein, MD  06/24/2016 11:29 AM    [facility] [address], [place], [place]  [address] Phone: [phone]; Fax: [phone]

## 2016-06-23 NOTE — Patient Instructions (Signed)
Dr Croitoru recommends that you schedule a follow-up appointment in 1 year. You will receive a reminder letter in the mail two months in advance. If you don't receive a letter, please call our office to schedule the follow-up appointment.  If you need a refill on your cardiac medications before your next appointment, please call your pharmacy. 

## 2016-06-24 DIAGNOSIS — I341 Nonrheumatic mitral (valve) prolapse: Secondary | ICD-10-CM | POA: Insufficient documentation

## 2016-07-22 ENCOUNTER — Other Ambulatory Visit: Payer: Self-pay | Admitting: Cardiovascular Disease

## 2016-07-25 NOTE — Telephone Encounter (Signed)
Rx has been sent to the pharmacy electronically. ° °

## 2016-07-28 ENCOUNTER — Ambulatory Visit: Payer: Medicare Other | Admitting: Cardiothoracic Surgery

## 2016-07-28 ENCOUNTER — Other Ambulatory Visit: Payer: Medicare Other

## 2016-08-04 ENCOUNTER — Encounter: Payer: Self-pay | Admitting: Cardiothoracic Surgery

## 2016-08-04 ENCOUNTER — Ambulatory Visit (INDEPENDENT_AMBULATORY_CARE_PROVIDER_SITE_OTHER): Payer: Medicare Other | Admitting: Cardiothoracic Surgery

## 2016-08-04 ENCOUNTER — Ambulatory Visit
Admission: RE | Admit: 2016-08-04 | Discharge: 2016-08-04 | Disposition: A | Discharge status: Home / Self Care | Payer: Medicare Other | Source: Ambulatory Visit | Attending: Cardiothoracic Surgery | Admitting: Cardiothoracic Surgery

## 2016-08-04 VITALS — BP 126/66 | HR 81 | Resp 16 | Ht 66.0 in | Wt 115.0 lb

## 2016-08-04 DIAGNOSIS — I711 Thoracic aortic aneurysm, ruptured, unspecified: Secondary | ICD-10-CM

## 2016-08-04 DIAGNOSIS — I712 Thoracic aortic aneurysm, without rupture, unspecified: Secondary | ICD-10-CM

## 2016-08-04 DIAGNOSIS — Z09 Encounter for follow-up examination after completed treatment for conditions other than malignant neoplasm: Secondary | ICD-10-CM | POA: Diagnosis not present

## 2016-08-04 MED ORDER — IOPAMIDOL (ISOVUE-370) INJECTION 76%
75.0000 mL | Freq: Once | INTRAVENOUS | Status: AC | PRN
  Administered 2016-08-04: 75 mL via INTRAVENOUS

## 2016-08-04 NOTE — Progress Notes (Signed)
[address]       [place] [address]             [phone]          Yvonne [name] [facility] Medical Record [id number] Date of Birth: 03-13-1926  Referring: Sanda Klein, MD Primary Care: Annye Asa, MD  Chief Complaint:    Chief Complaint  Patient presents with  . Routine Post Op    1 yr f/u s/p REP. of  RUPTURED  ANEURYSM 06/25/10 with a CTA CHEST    History of Present Illness:    Patient returns today for followup CT scan of the chest after having repair of a ruptured ascending aortic aneurysm and dissection and resuspension of aortic valve done as an emergency 06/25/2010. Now 5 years postop she continues to be ambulatory and caring for herself. She has no overt symptoms of congestive heart failure. Denies pedal edema   Current Activity/ Functional Status: Walks with a cane, but otherwise is mobile   Past Medical History:  Diagnosis Date  . Acute blood loss anemia 2011   WITH ANEURYSM ONLY  . Afib ([place])    -no   . Arthritis   . Cancer ([facility]) LATE 1970'S   skin cancer - arm squamous  . Compression fracture of L2 lumbar vertebra ([facility])   . GERD (gastroesophageal reflux disease)    "mild"  . Heart murmur   . History of atrial paroxysmal tachycardia   . History of blood transfusion   . History of hypothyroidism   . Hypertension   . Hypothyroidism   . Mitral valve prolapse   . Moderate aortic insufficiency 05/25/2014  . Osteoporosis   . Pericardial tamponade 05/2010  . Pneumonia    many years ago  . Premature atrial contractions   . Syncope    VERY RARE  . Thoracic aortic aneurysm ([facility]) 05/2010   Ruptured ascending  . UTI (lower urinary tract infection)    CHRONIC  . Volume overload 2011    Past Surgical History:  Procedure Laterality Date  . ABLATION    . APPENDECTOMY    . BREAST SURGERY Right    right biospy  . Classic atrioventricular nodal reentrant  10/20/2010  . COLONOSCOPY    . EUS N/A 02/18/2016   Procedure: UPPER ENDOSCOPIC ULTRASOUND (EUS) LINEAR;  Surgeon: Milus Banister, MD;  Location: [facility];  Service: Endoscopy;  Laterality: N/A;  . EYE SURGERY Bilateral 2003   cataract bilateral  . HERNIA REPAIR  1993   Right Inguinal  . KYPHOPLASTY  09/05/2012   Procedure: KYPHOPLASTY;  Surgeon: Winfield Cunas, MD;  Location: [facility] ORS;  Service: Neurosurgery;  Laterality: N/A;  L1 kyphoplasty  . KYPHOPLASTY N/A 04/01/2014   Procedure: KYPHOPLASTY;  Surgeon: Ashok Pall, MD;  Location: [facility] NEURO ORS;  Service: Neurosurgery;  Laterality: N/A;  Lumbar Four Kyphoplasty  . KYPHOPLASTY N/A 08/07/2015   Procedure: KYPHOPLASTY LUMBAR THREE;  Surgeon: Ashok Pall, MD;  Location: [facility] NEURO ORS;  Service: Neurosurgery;  Laterality: N/A;  . KYPHOPLASTY N/A 12/09/2015   Procedure: Lumbar five, KYPHOPLASTY;  Surgeon: Ashok Pall, MD;  Location: [facility] NEURO ORS;  Service: Neurosurgery;  Laterality: N/A;  L5 kyphoplasty  . KYPHOPLASTY N/A 01/13/2016   Procedure: Thoracic seven KYPHOPLASTY;  Surgeon: Ashok Pall, MD;  Location: [facility] NEURO ORS;  Service: Neurosurgery;  Laterality: N/A;  . KYPHOPLASTY N/A 01/22/2016   Procedure: THORACIC EIGHT KYPHOPLASTY;  Surgeon: Ashok Pall, MD;  Location: [facility]  NEURO ORS;  Service: Neurosurgery;  Laterality: N/A;  T8 KYPHOPLASTY  . L2 kyphoplasty using Kyphon system.  03/17/2009  . PERCUTANEOUS PINNING WRIST FRACTURE Bilateral 1995   bil  . Replacement of ascending aorta and hemiarch and resuspension of the aortic valve with circulatory cardiopulmonary bypass and circulatory arrest  06/25/2010   Dr Servando Snare  . Revision of left total hip replacement using some allograft  11/23/2010  . TOE OSTEOTOMY     Right 2nd   . TONSILLECTOMY    . TUBAL LIGATION  1966    Family History  Problem Relation Age of Onset  . Heart failure Mother   . Stroke Father   . Hypertension Brother   . Hypertension Brother   . Hypertension Sister   . Hypertension Sister   . Stroke Sister   .  Stomach cancer      nephew  . Breast cancer      neice  . COPD Mother     never smoker    Social History   Social History  . Marital status: Widowed    Spouse name: N/A  . Number of children: N/A  . Years of education: N/A   Occupational History  . Not on file.   Social History Main Topics  . Smoking status: Never Smoker  . Smokeless tobacco: Never Used  . Alcohol use No  . Drug use: No  . Sexual activity: Not on file   Other Topics Concern  . Not on file   Social History Narrative  . No narrative on file    History  Smoking Status  . Never Smoker  Smokeless Tobacco  . Never Used    History  Alcohol Use No     No Known Allergies  Current Outpatient Prescriptions  Medication Sig Dispense Refill  . acetaminophen (TYLENOL) 500 MG tablet Take 1,000 mg by mouth every 6 (six) hours as needed (For pain.).    Marland Kitchen amLODipine (NORVASC) 5 MG tablet TAKE 1 TABLET BY MOUTH  DAILY 90 tablet 2  . amoxicillin (AMOXIL) 500 MG tablet Take 4 tablets (2gram) by mouth one hour prior to dental work. 20 tablet 0  . aspirin EC 325 MG tablet Take 325 mg by mouth daily.    . calcium-vitamin D (OSCAL WITH D) 500-200 MG-UNIT per tablet Take 1 tablet by mouth daily at 12 noon.    . cholecalciferol (VITAMIN D) 1000 UNITS tablet Take 2,000 Units by mouth daily at 12 noon.     . docusate sodium (COLACE) 100 MG capsule Take 100 mg by mouth 2 (two) times daily as needed for mild constipation.     . flecainide (TAMBOCOR) 50 MG tablet Take one-half tablet by  mouth two times daily 90 tablet 2  . ibuprofen (ADVIL,MOTRIN) 200 MG tablet Take 400 mg by mouth every 6 (six) hours as needed (For pain.).    Marland Kitchen levothyroxine (SYNTHROID, LEVOTHROID) 50 MCG tablet Take 1 tablet (50 mcg total) by mouth every morning. 90 tablet 1   No current facility-administered medications for this visit.        Physical Exam: BP 126/66 (BP Location: Right Arm, Patient Position: Sitting, Cuff Size: Normal)   Pulse  81   Resp 16   Ht 5\' 6"  (1.676 m)   Wt 115 lb (52.2 kg)   SpO2 95% Comment: ON RA  BMI 18.56 kg/m   General appearance: alert, cooperative and cachectic Neurologic: intact Heart: regular rate and rhythm and diastolic murmur: mid diastolic 2/6,  decrescendo at 2nd left intercostal space Lungs: clear to auscultation bilaterally and normal percussion bilaterally Abdomen: soft, non-tender; bowel sounds normal; no masses,  no organomegaly Extremities: extremities normal, atraumatic, no cyanosis or edema, Homans sign is negative, no sign of DVT and Patient has pain in the left hip from recent fall on previously replaced Wound: Sternal incision is stable and well healed Murmur sounds unchanged from previous description  Diagnostic Studies & Laboratory data:     Recent Radiology Findings:  Ct Angio Chest Aorta W &/or Wo Contrast  Result Date: 08/04/2016 CLINICAL DATA:  Thoracic aortic aneurysm, status post graft repair 6 years ago. EXAM: CT ANGIOGRAPHY CHEST WITH CONTRAST TECHNIQUE: Multidetector CT imaging of the chest was performed using the standard protocol during bolus administration of intravenous contrast. Multiplanar CT image reconstructions and MIPs were obtained to evaluate the vascular anatomy. CONTRAST:  75 cc Isovue 370 IV COMPARISON:  07/05/2015 FINDINGS: Cardiovascular: Status post graft repair of the ascending thoracic aorta. Continued dilatation. The proximal ascending aorta measures 4.6 cm, stable. The distal ascending aorta measures 5.8 cm compared to 6 cm previously. No change. No dissection. Heart is enlarged. Prior median sternotomy. Calcifications in the left main and left anterior descending coronary arteries. Mediastinum/Nodes: HO No mediastinal, hilar, or axillary adenopathy. Lungs/Pleura: Scarring in the apices, stable. No confluent airspace opacities, suspicious pulmonary nodules or pleural effusions. Upper Abdomen: Imaging into the upper abdomen shows no acute findings.  Musculoskeletal: Chest wall soft tissues are unremarkable. No acute bony abnormality or focal bone lesion. Changes of multi level vertebral augmentation in the thoracic and upper lumbar spine. Stable severe compression fracture at T12 without vertebral augmentation changes. Review of the MIP images confirms the above findings. IMPRESSION: Stable dilatation of the ascending thoracic aorta, status post graft repair. No dissection. No change since prior study. Biapical scarring. Coronary artery disease. Multilevel compression fractures status post vertebral augmentation. Electronically Signed   By: Rolm Baptise M.D.   On: 08/04/2016 13:01   Ct Angio Chest Aorta W/cm &/or Wo/cm  07/15/2015  CLINICAL DATA:  Thoracic aortic aneurysm without rupture. EXAM: CT ANGIOGRAPHY CHEST WITH CONTRAST TECHNIQUE: Multidetector CT imaging of the chest was performed using the standard protocol during bolus administration of intravenous contrast. Multiplanar CT image reconstructions and MIPs were obtained to evaluate the vascular anatomy. CONTRAST:  75 mL of Isovue 370 intravenously. COMPARISON:  CT scan of July 03, 2014. FINDINGS: Status post aortic graft placement extending from aortic valve to proximal aortic arch. The proximal ascending thoracic aorta measures 4.5 cm in maximum diameter which is stable compared to prior exam. The proximal portion of the transverse aortic arch measures 5.5 cm which is stable compared to prior exam. The proximal portion of the descending thoracic aorta measures 3.2 cm which is within normal limits. No dissection is noted. Great vessels are widely patent without stenosis. No pneumothorax or significant pleural effusion is noted. Stable biapical scarring is noted. No acute pulmonary disease is noted. No significant abnormality is noted in visualized portion of upper abdomen. No evidence of mediastinal mass or adenopathy is noted. Stable lower thoracic old compression fracture is noted. Review of  the MIP images confirms the above findings. IMPRESSION: Status post aortic graft placement for treatment of thoracic aortic aneurysm. There is stable dilatation of the ascending thoracic aorta and proximal aortic arch compared to prior exam. No dissection or significant changes noted compared to prior exam. Electronically Signed   By: Marijo Conception, M.D.   On: 07/15/2015  13:49   Echo from June 2016 is reviewed, mild aortic insufficiency and mild mitral insufficiency   Recent Lab Findings: Lab Results  Component Value Date   WBC 6.3 02/23/2016   HGB 13.9 02/23/2016   HCT 42.3 02/23/2016   PLT 161.0 02/23/2016   GLUCOSE 115 (H) 02/23/2016   ALT 8 02/23/2016   AST 17 02/23/2016   NA 139 02/23/2016   K 4.8 02/23/2016   CL 101 02/23/2016   CREATININE 0.94 02/23/2016   BUN 23 02/23/2016   CO2 32 02/23/2016   TSH 1.90 02/23/2016   INR 0.92 11/17/2010      Assessment / Plan:    Patient returns to the office with a followup CTA of the chest now 8 years and just past her 33 th birthday after emergency replacement of her acscending aorta resuspension of her aortic valve. She continues to do well postoperatively. Dilatation of her aortic arch remains unchanged, with her age and redo status direct treatment of her aortic arch would carry prohibitive risk, she is not interested in pursuing any surgical intervention but would like to continue to follow it I will see her back in one year with a follow-up CT scan of the chest Incidentally in the past year her sister is also been seen for aortic insufficiency and dilated descending aorta, the patient notes that her children and other family members have been checked for dilated descending aorta is.  Grace Isaac MD  Beeper [phone] Office [phone] 08/04/2016 1:57 PM

## 2016-08-05 NOTE — Progress Notes (Signed)
Thank you again MCr 

## 2016-08-08 ENCOUNTER — Ambulatory Visit (INDEPENDENT_AMBULATORY_CARE_PROVIDER_SITE_OTHER): Payer: Medicare Other | Admitting: Family Medicine

## 2016-08-08 ENCOUNTER — Other Ambulatory Visit: Payer: Self-pay | Admitting: Family Medicine

## 2016-08-08 ENCOUNTER — Encounter: Payer: Self-pay | Admitting: Family Medicine

## 2016-08-08 ENCOUNTER — Other Ambulatory Visit: Payer: Self-pay | Admitting: *Deleted

## 2016-08-08 VITALS — BP 110/62 | HR 82 | Temp 98.0°F | Resp 17 | Ht 66.0 in | Wt 118.4 lb

## 2016-08-08 DIAGNOSIS — M810 Age-related osteoporosis without current pathological fracture: Secondary | ICD-10-CM | POA: Diagnosis not present

## 2016-08-08 DIAGNOSIS — R82998 Other abnormal findings in urine: Secondary | ICD-10-CM

## 2016-08-08 DIAGNOSIS — E039 Hypothyroidism, unspecified: Secondary | ICD-10-CM

## 2016-08-08 DIAGNOSIS — Z5181 Encounter for therapeutic drug level monitoring: Secondary | ICD-10-CM | POA: Insufficient documentation

## 2016-08-08 DIAGNOSIS — Z79899 Other long term (current) drug therapy: Secondary | ICD-10-CM

## 2016-08-08 DIAGNOSIS — I471 Supraventricular tachycardia: Secondary | ICD-10-CM | POA: Diagnosis not present

## 2016-08-08 DIAGNOSIS — I10 Essential (primary) hypertension: Secondary | ICD-10-CM | POA: Diagnosis not present

## 2016-08-08 DIAGNOSIS — Z0000 Encounter for general adult medical examination without abnormal findings: Secondary | ICD-10-CM

## 2016-08-08 LAB — CBC WITH DIFFERENTIAL/PLATELET
BASOS PCT: 0.5 % (ref 0.0–3.0)
Basophils Absolute: 0 10*3/uL (ref 0.0–0.1)
EOS PCT: 0.8 % (ref 0.0–5.0)
Eosinophils Absolute: 0 10*3/uL (ref 0.0–0.7)
HEMATOCRIT: 39 % (ref 36.0–46.0)
HEMOGLOBIN: 13.1 g/dL (ref 12.0–15.0)
LYMPHS PCT: 22.4 % (ref 12.0–46.0)
Lymphs Abs: 1.4 10*3/uL (ref 0.7–4.0)
MCHC: 33.5 g/dL (ref 30.0–36.0)
MCV: 91.6 fl (ref 78.0–100.0)
MONOS PCT: 4.8 % (ref 3.0–12.0)
Monocytes Absolute: 0.3 10*3/uL (ref 0.1–1.0)
Neutro Abs: 4.3 10*3/uL (ref 1.4–7.7)
Neutrophils Relative %: 71.5 % (ref 43.0–77.0)
Platelets: 161 10*3/uL (ref 150.0–400.0)
RBC: 4.25 Mil/uL (ref 3.87–5.11)
RDW: 14.3 % (ref 11.5–15.5)
WBC: 6 10*3/uL (ref 4.0–10.5)

## 2016-08-08 LAB — POCT URINALYSIS DIPSTICK
BILIRUBIN UA: NEGATIVE
Glucose, UA: NEGATIVE
KETONES UA: NEGATIVE
LEUKOCYTES UA: NEGATIVE
PH UA: 6
PROTEIN UA: 0.15
Spec Grav, UA: >=1.03
Urobilinogen, UA: 0.2

## 2016-08-08 LAB — HEPATIC FUNCTION PANEL
ALBUMIN: 4.2 g/dL (ref 3.5–5.2)
ALT: 8 U/L (ref 0–35)
AST: 15 U/L (ref 0–37)
Alkaline Phosphatase: 72 U/L (ref 39–117)
BILIRUBIN TOTAL: 1.2 mg/dL (ref 0.2–1.2)
Bilirubin, Direct: 0.2 mg/dL (ref 0.0–0.3)
Total Protein: 6.9 g/dL (ref 6.0–8.3)

## 2016-08-08 LAB — LIPID PANEL
CHOL/HDL RATIO: 2
Cholesterol: 162 mg/dL (ref 0–200)
HDL: 86 mg/dL (ref >39.00)
LDL CALC: 59 mg/dL (ref 0–99)
NONHDL: 75.93
Triglycerides: 85 mg/dL (ref 0.0–149.0)
VLDL: 17 mg/dL (ref 0.0–40.0)

## 2016-08-08 LAB — VITAMIN D 25 HYDROXY (VIT D DEFICIENCY, FRACTURES): VITD: 35.41 ng/mL (ref 30.00–100.00)

## 2016-08-08 LAB — BASIC METABOLIC PANEL
BUN: 23 mg/dL (ref 6–23)
CALCIUM: 9.2 mg/dL (ref 8.4–10.5)
CHLORIDE: 103 meq/L (ref 96–112)
CO2: 28 mEq/L (ref 19–32)
CREATININE: 0.88 mg/dL (ref 0.40–1.20)
GFR: 64.44 mL/min (ref >60.00)
Glucose, Bld: 105 mg/dL — ABNORMAL HIGH (ref 70–99)
Potassium: 4.4 mEq/L (ref 3.5–5.1)
Sodium: 139 mEq/L (ref 135–145)

## 2016-08-08 LAB — TSH: TSH: 1.65 u[IU]/mL (ref 0.35–4.50)

## 2016-08-08 MED ORDER — CEPHALEXIN 500 MG PO CAPS: 500.0000 mg | ORAL_CAPSULE | Freq: Two times a day (BID) | ORAL | 0 refills | Status: DC

## 2016-08-08 NOTE — Progress Notes (Signed)
Pre visit review using our clinic review tool, if applicable. No additional management support is needed unless otherwise documented below in the visit note. 

## 2016-08-08 NOTE — Addendum Note (Signed)
Addended by: Katina Dung on: 08/08/2016 03:01 PM   Modules accepted: Orders

## 2016-08-08 NOTE — Progress Notes (Signed)
   Subjective:    Patient ID: Yvonne [name], female    DOB: Jan 23, 1926, 82 y.o.   MRN: [id number]  HPI Here today for CPE.  Risk Factors: HTN- chronic problem, on Amlodipine, Losartan SVT- chronic problem, seeing Dr C.  On Flecainide.  On ASA 325mg  daily Hypothyroid- no longer seeing Dr Altheimer, on Levothyroxine. Osteoporosis- chronic problem, on Ca and Vit D.  Does DEXA w/ Dr Nori Riis.  Has previously taken Boniva and Fosamax and Forteo. Physical Activity: limited Fall Risk: low Depression: denies Hearing: mildly decreased to conversational tones ADL's: independent Cognitive: normal linear thought process, memory and attention intact Home Safety: lives in basement apartment at daughter's house Height, Weight, BMI, Visual Acuity: see vitals, vision corrected to 20/20 w/ glasses Counseling: no need for colonoscopy, UTD on mammo (sees Dr Nori Riis next month).  UTD on flu Care team reviewed and update Labs Ordered: See A&P Care Plan: See A&P    Review of Systems Patient reports no vision/ hearing changes, adenopathy,fever, weight change,  persistant/recurrent hoarseness , swallowing issues, chest pain, palpitations, edema, persistant/recurrent cough, hemoptysis, dyspnea (rest/exertional/paroxysmal nocturnal), gastrointestinal bleeding (melena, rectal bleeding), abdominal pain, significant heartburn, bowel changes, GU symptoms (dysuria, hematuria, incontinence), Gyn symptoms (abnormal  bleeding, pain),  syncope, focal weakness, memory loss, numbness & tingling, skin/hair/nail changes, abnormal bruising or bleeding, anxiety, or depression.     Objective:   Physical Exam General Appearance:    Alert, cooperative, no distress, appears stated age  Head:    Normocephalic, without obvious abnormality, atraumatic  Eyes:    PERRL, conjunctiva/corneas clear, EOM's intact, fundi    benign, both eyes  Ears:    Normal TM's and external ear canals, both ears  Nose:   Nares normal, septum midline,  mucosa normal, no drainage    or sinus tenderness  Throat:   Lips, mucosa, and tongue normal; teeth and gums normal  Neck:   Supple, symmetrical, trachea midline, no adenopathy;    Thyroid: no enlargement/tenderness/nodules  Back:     Symmetric, no curvature, ROM normal, no CVA tenderness  Lungs:     Clear to auscultation bilaterally, respirations unlabored  Chest Wall:    No tenderness or deformity   Heart:    Regular rate and rhythm, S1 and S2 normal, no murmur, rub   or gallop  Breast Exam:    Deferred to GYN  Abdomen:     Soft, non-tender, bowel sounds active all four quadrants,    no masses, no organomegaly  Genitalia:    Deferred to GYN  Rectal:    Extremities:   Extremities normal, atraumatic, no cyanosis or edema  Pulses:   2+ and symmetric all extremities  Skin:   Skin color, texture, turgor normal, no rashes or lesions  Lymph nodes:   Cervical, supraclavicular, and axillary nodes normal  Neurologic:   CNII-XII intact, normal strength, sensation and reflexes    throughout          Assessment & Plan:

## 2016-08-08 NOTE — Assessment & Plan Note (Signed)
Pt's PE unchanged from previous.  UTD on GYN (Dr Nori Riis).  No need for colonoscopy.  UTD on immunizations.  Written screening schedule updated and given to pt.  Check labs.  Anticipatory guidance provided.

## 2016-08-08 NOTE — Assessment & Plan Note (Signed)
Chronic problem.  No longer following w/ Dr Altheimer.  Currently asymptomatic.  Check labs.  Adjust meds prn

## 2016-08-08 NOTE — Addendum Note (Signed)
Addended by: Katina Dung on: 08/08/2016 03:00 PM   Modules accepted: Orders

## 2016-08-08 NOTE — Assessment & Plan Note (Signed)
Chronic problem.  Currently asymptomatic.  Following w/ Dr C.  On Flecainide.  No changes at this time

## 2016-08-08 NOTE — Assessment & Plan Note (Signed)
Chronic problem.  Pt has had multiple kyphoplasties.  Not currently on tx- pt has been on multiple in the past.  Check Vit D level and replete prn.

## 2016-08-08 NOTE — Assessment & Plan Note (Addendum)
Chronic problem.  Adequate control today.  Asymptomatic.  Check labs.  No anticipated med changes. 

## 2016-08-08 NOTE — Patient Instructions (Signed)
Follow up in 6 months to recheck BP We'll notify you of your lab results and make any changes if needed Keep up the good work!  You look great! Please have Dr Nori Riis send me a copy of your mammogram No need for colonoscopy- yay!!! Call with any questions or concerns Happy Holidays!!!

## 2016-08-10 ENCOUNTER — Other Ambulatory Visit: Payer: Self-pay | Admitting: General Practice

## 2016-08-10 LAB — URINE CULTURE: Colony Count: >=100000

## 2016-08-10 MED ORDER — CIPROFLOXACIN HCL 500 MG PO TABS: 500.0000 mg | ORAL_TABLET | Freq: Two times a day (BID) | ORAL | 0 refills | Status: DC

## 2016-08-15 ENCOUNTER — Encounter (HOSPITAL_COMMUNITY): Payer: Self-pay | Admitting: Pharmacy Technician

## 2016-08-15 ENCOUNTER — Inpatient Hospital Stay (HOSPITAL_COMMUNITY)
Admission: EM | Admit: 2016-08-15 | Discharge: 2016-08-20 | DRG: 958 | Disposition: A | Discharge status: Skilled Nursing Facility | Payer: Medicare Other | Attending: General Surgery | Admitting: General Surgery

## 2016-08-15 ENCOUNTER — Emergency Department (HOSPITAL_COMMUNITY): Payer: Medicare Other

## 2016-08-15 DIAGNOSIS — S52602A Unspecified fracture of lower end of left ulna, initial encounter for closed fracture: Secondary | ICD-10-CM | POA: Diagnosis present

## 2016-08-15 DIAGNOSIS — Z79899 Other long term (current) drug therapy: Secondary | ICD-10-CM

## 2016-08-15 DIAGNOSIS — S52572A Other intraarticular fracture of lower end of left radius, initial encounter for closed fracture: Principal | ICD-10-CM | POA: Diagnosis present

## 2016-08-15 DIAGNOSIS — Z8249 Family history of ischemic heart disease and other diseases of the circulatory system: Secondary | ICD-10-CM

## 2016-08-15 DIAGNOSIS — E039 Hypothyroidism, unspecified: Secondary | ICD-10-CM | POA: Diagnosis present

## 2016-08-15 DIAGNOSIS — S7222XA Displaced subtrochanteric fracture of left femur, initial encounter for closed fracture: Secondary | ICD-10-CM | POA: Diagnosis present

## 2016-08-15 DIAGNOSIS — S2242XA Multiple fractures of ribs, left side, initial encounter for closed fracture: Secondary | ICD-10-CM | POA: Diagnosis present

## 2016-08-15 DIAGNOSIS — S62102A Fracture of unspecified carpal bone, left wrist, initial encounter for closed fracture: Secondary | ICD-10-CM | POA: Diagnosis present

## 2016-08-15 DIAGNOSIS — Z85828 Personal history of other malignant neoplasm of skin: Secondary | ICD-10-CM

## 2016-08-15 DIAGNOSIS — I351 Nonrheumatic aortic (valve) insufficiency: Secondary | ICD-10-CM | POA: Diagnosis present

## 2016-08-15 DIAGNOSIS — I341 Nonrheumatic mitral (valve) prolapse: Secondary | ICD-10-CM | POA: Diagnosis present

## 2016-08-15 DIAGNOSIS — Z7982 Long term (current) use of aspirin: Secondary | ICD-10-CM

## 2016-08-15 DIAGNOSIS — S52502A Unspecified fracture of the lower end of left radius, initial encounter for closed fracture: Secondary | ICD-10-CM

## 2016-08-15 DIAGNOSIS — M978XXA Periprosthetic fracture around other internal prosthetic joint, initial encounter: Secondary | ICD-10-CM

## 2016-08-15 DIAGNOSIS — I4729 Other ventricular tachycardia: Secondary | ICD-10-CM

## 2016-08-15 DIAGNOSIS — M810 Age-related osteoporosis without current pathological fracture: Secondary | ICD-10-CM | POA: Diagnosis present

## 2016-08-15 DIAGNOSIS — S32592A Other specified fracture of left pubis, initial encounter for closed fracture: Secondary | ICD-10-CM | POA: Diagnosis present

## 2016-08-15 DIAGNOSIS — S32512A Fracture of superior rim of left pubis, initial encounter for closed fracture: Secondary | ICD-10-CM | POA: Diagnosis present

## 2016-08-15 DIAGNOSIS — M9702XA Periprosthetic fracture around internal prosthetic left hip joint, initial encounter: Secondary | ICD-10-CM | POA: Diagnosis present

## 2016-08-15 DIAGNOSIS — I10 Essential (primary) hypertension: Secondary | ICD-10-CM | POA: Diagnosis present

## 2016-08-15 DIAGNOSIS — S61412A Laceration without foreign body of left hand, initial encounter: Secondary | ICD-10-CM | POA: Diagnosis present

## 2016-08-15 DIAGNOSIS — Z96649 Presence of unspecified artificial hip joint: Secondary | ICD-10-CM

## 2016-08-15 DIAGNOSIS — W1830XA Fall on same level, unspecified, initial encounter: Secondary | ICD-10-CM | POA: Diagnosis present

## 2016-08-15 DIAGNOSIS — I472 Ventricular tachycardia: Secondary | ICD-10-CM

## 2016-08-15 DIAGNOSIS — W19XXXA Unspecified fall, initial encounter: Secondary | ICD-10-CM | POA: Diagnosis present

## 2016-08-15 DIAGNOSIS — I471 Supraventricular tachycardia: Secondary | ICD-10-CM | POA: Diagnosis present

## 2016-08-15 DIAGNOSIS — K219 Gastro-esophageal reflux disease without esophagitis: Secondary | ICD-10-CM | POA: Diagnosis present

## 2016-08-15 DIAGNOSIS — S2232XA Fracture of one rib, left side, initial encounter for closed fracture: Secondary | ICD-10-CM

## 2016-08-15 DIAGNOSIS — Z23 Encounter for immunization: Secondary | ICD-10-CM

## 2016-08-15 LAB — BASIC METABOLIC PANEL
ANION GAP: 8 (ref 5–15)
BUN: 24 mg/dL — ABNORMAL HIGH (ref 6–20)
CO2: 21 mmol/L — AB (ref 22–32)
Calcium: 8.7 mg/dL — ABNORMAL LOW (ref 8.9–10.3)
Chloride: 111 mmol/L (ref 101–111)
Creatinine, Ser: 0.9 mg/dL (ref 0.44–1.00)
GFR, EST NON AFRICAN AMERICAN: 55 mL/min — AB (ref >60)
GLUCOSE: 190 mg/dL — AB (ref 65–99)
POTASSIUM: 3.7 mmol/L (ref 3.5–5.1)
Sodium: 140 mmol/L (ref 135–145)

## 2016-08-15 LAB — CBC WITH DIFFERENTIAL/PLATELET
BASOS ABS: 0 10*3/uL (ref 0.0–0.1)
BASOS PCT: 0 %
Eosinophils Absolute: 0 10*3/uL (ref 0.0–0.7)
Eosinophils Relative: 0 %
HEMATOCRIT: 34.2 % — AB (ref 36.0–46.0)
HEMOGLOBIN: 11 g/dL — AB (ref 12.0–15.0)
LYMPHS PCT: 5 %
Lymphs Abs: 0.6 10*3/uL — ABNORMAL LOW (ref 0.7–4.0)
MCH: 30.4 pg (ref 26.0–34.0)
MCHC: 32.2 g/dL (ref 30.0–36.0)
MCV: 94.5 fL (ref 78.0–100.0)
MONO ABS: 0.3 10*3/uL (ref 0.1–1.0)
MONOS PCT: 3 %
NEUTROS ABS: 11.7 10*3/uL — AB (ref 1.7–7.7)
NEUTROS PCT: 93 %
Platelets: 113 10*3/uL — ABNORMAL LOW (ref 150–400)
RBC: 3.62 MIL/uL — ABNORMAL LOW (ref 3.87–5.11)
RDW: 14.5 % (ref 11.5–15.5)
WBC: 12.6 10*3/uL — ABNORMAL HIGH (ref 4.0–10.5)

## 2016-08-15 LAB — PROTIME-INR
INR: 1
Prothrombin Time: 13.2 seconds (ref 11.4–15.2)

## 2016-08-15 MED ORDER — FENTANYL CITRATE (PF) 100 MCG/2ML IJ SOLN
50.0000 ug | Freq: Once | INTRAMUSCULAR | Status: AC
  Administered 2016-08-15: 50 ug via INTRAVENOUS
  Filled 2016-08-15: qty 2

## 2016-08-15 MED ORDER — TETANUS-DIPHTH-ACELL PERTUSSIS 5-2.5-18.5 LF-MCG/0.5 IM SUSP
0.5000 mL | Freq: Once | INTRAMUSCULAR | Status: AC
  Administered 2016-08-15: 0.5 mL via INTRAMUSCULAR
  Filled 2016-08-15: qty 0.5

## 2016-08-15 MED ORDER — ONDANSETRON HCL 4 MG/2ML IJ SOLN
4.0000 mg | Freq: Once | INTRAMUSCULAR | Status: AC
  Administered 2016-08-15: 4 mg via INTRAVENOUS
  Filled 2016-08-15: qty 2

## 2016-08-15 MED ORDER — BUPIVACAINE HCL (PF) 0.5 % IJ SOLN
5.0000 mL | Freq: Once | INTRAMUSCULAR | Status: AC
  Administered 2016-08-16: 5 mL
  Filled 2016-08-15: qty 10

## 2016-08-15 MED ORDER — BUPIVACAINE HCL 0.5 % IJ SOLN
5.0000 mL | Freq: Once | INTRAMUSCULAR | Status: DC
Start: 2016-08-15 — End: 2016-08-15
  Filled 2016-08-15: qty 5

## 2016-08-15 MED ORDER — FENTANYL CITRATE (PF) 100 MCG/2ML IJ SOLN
INTRAMUSCULAR | Status: DC | PRN
  Administered 2016-08-15: 50 ug via INTRAVENOUS

## 2016-08-15 NOTE — ED Notes (Signed)
Pt noted to have oxygen saturations at 91%. Placed pt on 2L [place] and pt oxygen saturations improved to 98%. Will cont to monitor.

## 2016-08-15 NOTE — H&P (Signed)
Yvonne [name] is an 82 y.o. female.   Chief Complaint: Fall HPI: Patient is a 82-year-old female who arrived to [facility] secondary to a single fall. Patient states she was walking on the driveway and fell onto her left side. Patient presented to the ER and underwent routine workup. Patient was found to have a left distal both bone forearm fracture, left rib fractures, left periprosthesis hip fracture.  Patient has had a history of a previous left femur fracture with a revision. This was by Dr. Olin. Patient also has a previous history of a ruptured thoracic aneurysm which was repaired approximately 5 years ago.  Patient sees Dr. Mihai Croitoru as her cardiologist.     Past Medical History:  Diagnosis Date  . Acute blood loss anemia 2011   WITH ANEURYSM ONLY  . Afib (HCC)    -no   . Arthritis   . Cancer (HCC) LATE 1970'S   skin cancer - arm squamous  . Compression fracture of L2 lumbar vertebra (HCC)   . GERD (gastroesophageal reflux disease)    "mild"  . Heart murmur   . History of atrial paroxysmal tachycardia   . History of blood transfusion   . History of hypothyroidism   . Hypertension   . Hypothyroidism   . Mitral valve prolapse   . Moderate aortic insufficiency 05/25/2014  . Osteoporosis   . Pericardial tamponade 05/2010  . Pneumonia    many years ago  . Premature atrial contractions   . Syncope    VERY RARE  . Thoracic aortic aneurysm (HCC) 05/2010   Ruptured ascending  . UTI (lower urinary tract infection)    CHRONIC  . Volume overload 2011    Past Surgical History:  Procedure Laterality Date  . ABLATION    . APPENDECTOMY    . BREAST SURGERY Right    right biospy  . Classic atrioventricular nodal reentrant  10/20/2010  . COLONOSCOPY    . EUS N/A 02/18/2016   Procedure: UPPER ENDOSCOPIC ULTRASOUND (EUS) LINEAR;  Surgeon: Daniel P Jacobs, MD;  Location: [facility];  Service: Endoscopy;  Laterality: N/A;  . EYE SURGERY Bilateral 2003   cataract  bilateral  . HERNIA REPAIR  1993   Right Inguinal  . KYPHOPLASTY  09/05/2012   Procedure: KYPHOPLASTY;  Surgeon: Kyle L Cabbell, MD;  Location: [facility];  Service: Neurosurgery;  Laterality: N/A;  L1 kyphoplasty  . KYPHOPLASTY N/A 04/01/2014   Procedure: KYPHOPLASTY;  Surgeon: Kyle Cabbell, MD;  Location: [facility];  Service: Neurosurgery;  Laterality: N/A;  Lumbar Four Kyphoplasty  . KYPHOPLASTY N/A 08/07/2015   Procedure: KYPHOPLASTY LUMBAR THREE;  Surgeon: Kyle Cabbell, MD;  Location: [facility];  Service: Neurosurgery;  Laterality: N/A;  . KYPHOPLASTY N/A 12/09/2015   Procedure: Lumbar five, KYPHOPLASTY;  Surgeon: Kyle Cabbell, MD;  Location: [facility];  Service: Neurosurgery;  Laterality: N/A;  L5 kyphoplasty  . KYPHOPLASTY N/A 01/13/2016   Procedure: Thoracic seven KYPHOPLASTY;  Surgeon: Kyle Cabbell, MD;  Location: [facility];  Service: Neurosurgery;  Laterality: N/A;  . KYPHOPLASTY N/A 01/22/2016   Procedure: THORACIC EIGHT KYPHOPLASTY;  Surgeon: Kyle Cabbell, MD;  Location: [facility];  Service: Neurosurgery;  Laterality: N/A;  T8 KYPHOPLASTY  . L2 kyphoplasty using Kyphon system.  03/17/2009  . PERCUTANEOUS PINNING WRIST FRACTURE Bilateral 1995   bil  . Replacement of ascending aorta and hemiarch and resuspension of the aortic valve with circulatory cardiopulmonary bypass and circulatory arrest  06/25/2010     Dr Servando Snare  . Revision of left total hip replacement using some allograft  11/23/2010  . TOE OSTEOTOMY     Right 2nd   . TONSILLECTOMY    . TUBAL LIGATION  1966    Family History  Problem Relation Age of Onset  . Heart failure Mother   . COPD Mother     never smoker  . Stroke Father   . Hypertension Brother   . Hypertension Brother   . Hypertension Sister   . Hypertension Sister   . Stroke Sister   . Stomach cancer      nephew  . Breast cancer      neice   Social History:  reports that she has never smoked. She has never used smokeless tobacco. She  reports that she does not drink alcohol or use drugs.  Allergies: No Known Allergies   (Not in a hospital admission)  Results for orders placed or performed during the hospital encounter of 08/15/16 (from the past 48 hour(s))  Basic metabolic panel     Status: Abnormal   Collection Time: 08/15/16  7:44 PM  Result Value Ref Range   Sodium 140 135 - 145 mmol/L   Potassium 3.7 3.5 - 5.1 mmol/L   Chloride 111 101 - 111 mmol/L   CO2 21 (L) 22 - 32 mmol/L   Glucose, Bld 190 (H) 65 - 99 mg/dL   BUN 24 (H) 6 - 20 mg/dL   Creatinine, Ser 0.90 0.44 - 1.00 mg/dL   Calcium 8.7 (L) 8.9 - 10.3 mg/dL   GFR calc non Af Amer 55 (L) >60 mL/min   GFR calc Af Amer >60 >60 mL/min    Comment: (NOTE) The eGFR has been calculated using the CKD EPI equation. This calculation has not been validated in all clinical situations. eGFR's persistently <60 mL/min signify possible Chronic Kidney Disease.    Anion gap 8 5 - 15  CBC WITH DIFFERENTIAL     Status: Abnormal   Collection Time: 08/15/16  7:44 PM  Result Value Ref Range   WBC 12.6 (H) 4.0 - 10.5 K/uL   RBC 3.62 (L) 3.87 - 5.11 MIL/uL   Hemoglobin 11.0 (L) 12.0 - 15.0 g/dL   HCT 34.2 (L) 36.0 - 46.0 %   MCV 94.5 78.0 - 100.0 fL   MCH 30.4 26.0 - 34.0 pg   MCHC 32.2 30.0 - 36.0 g/dL   RDW 14.5 11.5 - 15.5 %   Platelets 113 (L) 150 - 400 K/uL    Comment: REPEATED TO VERIFY SPECIMEN CHECKED FOR CLOTS PLATELETS APPEAR DECREASED PLATELET COUNT CONFIRMED BY SMEAR    Neutrophils Relative % 93 %   Neutro Abs 11.7 (H) 1.7 - 7.7 K/uL   Lymphocytes Relative 5 %   Lymphs Abs 0.6 (L) 0.7 - 4.0 K/uL   Monocytes Relative 3 %   Monocytes Absolute 0.3 0.1 - 1.0 K/uL   Eosinophils Relative 0 %   Eosinophils Absolute 0.0 0.0 - 0.7 K/uL   Basophils Relative 0 %   Basophils Absolute 0.0 0.0 - 0.1 K/uL  Protime-INR     Status: None   Collection Time: 08/15/16  7:44 PM  Result Value Ref Range   Prothrombin Time 13.2 11.4 - 15.2 seconds   INR 1.00   Type  and screen [place]     Status: None (Preliminary result)   Collection Time: 08/15/16  7:44 PM  Result Value Ref Range   ISSUE DATE / TIME [id number]  Blood Product Unit Number [id number]    PRODUCT CODE I7867E72    Unit Type and Rh 0947    Blood Product Expiration Date [id number]    ISSUE DATE / TIME [id number]    Blood Product Unit Number [id number]    PRODUCT CODE S4967R91    Unit Type and Rh 6384    Blood Product Expiration Date [id number]    Dg Chest 1 View  Result Date: 08/15/2016 CLINICAL DATA:  Unwitnessed fall today. Was probably on the ground for several hours. EXAM: CHEST 1 VIEW COMPARISON:  08/04/2016 FINDINGS: Fractures of the left third fourth and fifth ribs, mildly displaced. No pneumothorax. No large effusion. Normal mediastinal contours. The lungs are clear. IMPRESSION: Left third through fifth rib fractures. Electronically Signed   By: Andreas Newport M.D.   On: 08/15/2016 21:07   Dg Pelvis 1-2 Views  Result Date: 08/15/2016 CLINICAL DATA:  [place] outside of while walking to or mL boxes afternoon. EXAM: PELVIS - 1-2 VIEW COMPARISON:  12/30/2015 FINDINGS: Chronic fracture deformities of the pubic rami bilaterally. There is superimposed acute fracture of the left femur just below the trochanters and superimposed acute fracture of the superior left pubic ramus. No dislocation. IMPRESSION: Chronic pubic ramus fracture deformities with superimposed acute fractures of the left superior ramus and of the left subtrochanteric femur. Electronically Signed   By: Andreas Newport M.D.   On: 08/15/2016 21:14   Dg Elbow Complete Left  Result Date: 08/15/2016 CLINICAL DATA:  Left elbow injury and pain due to a trip and fall today. Initial encounter. EXAM: LEFT ELBOW - COMPLETE 3+ VIEW COMPARISON:  None. FINDINGS: There is no evidence of fracture, dislocation, or joint effusion. There is no evidence of arthropathy or other focal bone abnormality. Soft  tissues are unremarkable. IMPRESSION: Negative exam. Electronically Signed   By: Inge Rise M.D.   On: 08/15/2016 21:05   Dg Wrist Complete Left  Result Date: 08/15/2016 CLINICAL DATA:  82 y/o  F; status post fall with wrist fracture. EXAM: LEFT HAND - COMPLETE 3+ VIEW; LEFT WRIST - COMPLETE 3+ VIEW COMPARISON:  None. FINDINGS: Left wrist: Intra-articular overriding distal radius acute fracture with comminution, 1 shaft's with volar displacement of the radial shaft relative to the epiphysis, and volar apex angulation. Distal ulnar diaphysis extra-articular mildly displaced and medial and volar apex angulated acute fracture. Chronic ulnar styloid fracture. No radiocarpal dislocation. Left hand: No acute fracture or dislocation of the hand identified. Extensive osteoarthrosis of interphalangeal joints with large periarticular osteophytes. Mild basal joint and moderate triscaphe osteoarthrosis. Bones are demineralized. Chronic fracture deformity with apex dorsal angulation of fifth metacarpal head. IMPRESSION: 1. Intraarticular, comminuted, displaced, angulated, and overriding distal radial acute fracture. 2. Nondisplaced mildly angulated distal ulnar diaphysis acute fracture. 3. Chronic ulnar styloid fracture. 4. Chronic fifth metacarpal head fracture. 5. Osteoarthrosis of interphalangeal joints, the basal joint, and triscaphe complex. Electronically Signed   By: Kristine Garbe M.D.   On: 08/15/2016 21:14   Ct Head Wo Contrast  Result Date: 08/15/2016 CLINICAL DATA:  Trip and fall injury at 15:45 today. EXAM: CT HEAD WITHOUT CONTRAST TECHNIQUE: Contiguous axial images were obtained from the base of the skull through the vertex without intravenous contrast. COMPARISON:  None. FINDINGS: Brain: There is no intracranial hemorrhage, mass or evidence of acute infarction. There is moderate generalized atrophy. There is moderate chronic microvascular ischemic change. There is no significant  extra-axial fluid collection. No acute intracranial findings are evident. Vascular: No hyperdense vessel or unexpected  calcification. Skull: Normal. Negative for fracture or focal lesion. Sinuses/Orbits: No acute finding. Other: Small volume fat within the falx cerebri, an incidental finding. IMPRESSION: No acute intracranial findings. There is moderate generalized atrophy and chronic appearing white matter hypodensities which likely represent small vessel ischemic disease. Electronically Signed   By: Andreas Newport M.D.   On: 08/15/2016 21:25   Dg Hand Complete Left  Result Date: 08/15/2016 CLINICAL DATA:  82 y/o  F; status post fall with wrist fracture. EXAM: LEFT HAND - COMPLETE 3+ VIEW; LEFT WRIST - COMPLETE 3+ VIEW COMPARISON:  None. FINDINGS: Left wrist: Intra-articular overriding distal radius acute fracture with comminution, 1 shaft's with volar displacement of the radial shaft relative to the epiphysis, and volar apex angulation. Distal ulnar diaphysis extra-articular mildly displaced and medial and volar apex angulated acute fracture. Chronic ulnar styloid fracture. No radiocarpal dislocation. Left hand: No acute fracture or dislocation of the hand identified. Extensive osteoarthrosis of interphalangeal joints with large periarticular osteophytes. Mild basal joint and moderate triscaphe osteoarthrosis. Bones are demineralized. Chronic fracture deformity with apex dorsal angulation of fifth metacarpal head. IMPRESSION: 1. Intraarticular, comminuted, displaced, angulated, and overriding distal radial acute fracture. 2. Nondisplaced mildly angulated distal ulnar diaphysis acute fracture. 3. Chronic ulnar styloid fracture. 4. Chronic fifth metacarpal head fracture. 5. Osteoarthrosis of interphalangeal joints, the basal joint, and triscaphe complex. Electronically Signed   By: Kristine Garbe M.D.   On: 08/15/2016 21:14   Dg Femur Min 2 Views Left  Result Date: 08/15/2016 CLINICAL DATA:   Left hip pain due to a trip and fall today. EXAM: LEFT FEMUR 2 VIEWS COMPARISON:  None. FINDINGS: Total left hip arthroplasty is in place. The patient has an acute fracture of the subtrochanteric left femur. The fracture may extend into the greater trochanter. There is also a left superior pubic ramus fracture. The left hip is located. IMPRESSION: Acute subtrochanteric left hip fracture with a total hip arthroplasty in place. The fracture may extend to the greater trochanter. Acute left superior pubic ramus fracture. Electronically Signed   By: Inge Rise M.D.   On: 08/15/2016 21:09    Review of Systems  Constitutional: Negative for chills, fever, malaise/fatigue and weight loss.  HENT: Negative for ear pain, hearing loss and tinnitus.   Eyes: Negative for blurred vision, double vision, photophobia and pain.  Respiratory: Negative for cough, hemoptysis, sputum production and shortness of breath.   Cardiovascular: Negative for chest pain, palpitations, orthopnea and claudication.  Gastrointestinal: Negative for abdominal pain, heartburn, nausea and vomiting.  Genitourinary: Negative for dysuria, frequency and urgency.  Musculoskeletal: Negative for back pain, myalgias and neck pain.  Skin: Negative for itching and rash.  Neurological: Negative for dizziness, tingling, tremors and headaches.  All other systems reviewed and are negative.   Blood pressure 119/77, pulse 100, temperature 97.6 F (36.4 C), temperature source Oral, resp. rate (!) 27, height 5' 6" (1.676 m), weight 53.5 kg (118 lb), SpO2 98 %. Physical Exam  Constitutional: She is oriented to person, place, and time. She appears well-developed. No distress.  HENT:  Head: Normocephalic and atraumatic.  Right Ear: External ear normal.  Left Ear: External ear normal.  Eyes: Conjunctivae and EOM are normal. Pupils are equal, round, and reactive to light. Right eye exhibits no discharge. Left eye exhibits no discharge. No scleral  icterus.  Neck: Normal range of motion. Neck supple. No JVD present. No tracheal deviation present. No thyromegaly present.  Cardiovascular: Normal rate, regular rhythm, normal heart sounds  and intact distal pulses.  Exam reveals no gallop and no friction rub.   No murmur heard. Respiratory: Effort normal and breath sounds normal. No stridor. No respiratory distress. She has no wheezes. She has no rales. She exhibits tenderness (L lateral).  GI: Soft. Bowel sounds are normal. She exhibits no distension and no mass. There is no tenderness. There is no rebound and no guarding.  Musculoskeletal: She exhibits tenderness (LLE, LUE). She exhibits no edema or deformity.  Neurological: She is alert and oriented to person, place, and time.  Skin: Skin is warm and dry. She is not diaphoretic.  Psychiatric: She has a normal mood and affect. Her behavior is normal. Thought content normal.     Assessment/Plan 82-year-old female status post syncopal fall 1. Left distal radius, ulna fracture 2. Left pubic rami fractures 3. Left subtrochanteric femur fracture, periprosthetic 4. Left 3 through 5 rib fractures.  1. We will admit the patient to the stepdown unit versus the floor. 2. Pain control, NPO for now 3. Dr. Brooks from orthopedics has been counselled for her periprosthetic femur fracture 4. Dr. Murphy has been counseled to for her left wrist fracture.  Ramirez Jr., Armando, MD 08/15/2016, 11:54 PM   

## 2016-08-15 NOTE — ED Notes (Signed)
Patient transported to X-ray 

## 2016-08-15 NOTE — ED Provider Notes (Signed)
[facility] DEPT Provider Note   CSN: [id number] Arrival date & time: 08/15/16  1848     History   Chief Complaint Chief Complaint  Patient presents with  . Fall  . Hand Injury    HPI Yvonne [name] is a 82 y.o. female.  HPI Patient presents after a fall. Lost balance on a slope which is not unusual for her. Patient fell and hit her head as pain in her left wrist and left hip area. Deformity left wrist and hand has not been able ambulated. No loss conscious. Previous replacement of left hip along with a redo. Previous surgery on the left wrist also. Has had previous vertebralplasty also. Not complaining of back pain. Slight left lower chest pain. No trouble breathing. Pain somewhat improved with pain medicines by EMS. He laid on the ground for approximately 2 hours.   Past Medical History:  Diagnosis Date  . Acute blood loss anemia 2011   WITH ANEURYSM ONLY  . Afib ([place])    -no   . Arthritis   . Cancer ([place]) LATE 1970'S   skin cancer - arm squamous  . Compression fracture of L2 lumbar vertebra (HCC)   . GERD (gastroesophageal reflux disease)    "mild"  . Heart murmur   . History of atrial paroxysmal tachycardia   . History of blood transfusion   . History of hypothyroidism   . Hypertension   . Hypothyroidism   . Mitral valve prolapse   . Moderate aortic insufficiency 05/25/2014  . Osteoporosis   . Pericardial tamponade 05/2010  . Pneumonia    many years ago  . Premature atrial contractions   . Syncope    VERY RARE  . Thoracic aortic aneurysm ([place]) 05/2010   Ruptured ascending  . UTI (lower urinary tract infection)    CHRONIC  . Volume overload 2011    Patient Active Problem List   Diagnosis Date Noted  . Fall 08/16/2016  . Physical exam 08/08/2016  . MVP (mitral valve prolapse) 06/24/2016  . Upper airway cough syndrome 03/07/2016  . Shortness of breath 02/23/2016  . Other fatigue 02/23/2016  . Compression fracture of thoracic vertebra ([place])  01/22/2016  . Traumatic compression fracture of T7 thoracic vertebra ([place]) 01/13/2016  . Abdominal pain, epigastric 12/28/2015  . Osteoporosis 12/28/2015  . Surgery, elective 12/09/2015  . Compression fracture of L5 lumbar vertebra ([place]) 12/09/2015  . Second degree AV block 06/26/2015  . Aortic insufficiency 05/25/2014  . Compression fracture of body of thoracic vertebra (HCC) 04/01/2014  . Compression fracture of L4 lumbar vertebra (HCC) 04/01/2014  . Orthostatic hypotension 12/19/2013  . Syncope 12/19/2013  . AV block, 1st degree 12/19/2013  . Malnourished ([place]) 12/19/2013  . Hx: UTI (urinary tract infection) 12/19/2013  . Systolic murmur [id number]  . Pelvic fracture ([place]) 10/25/2012  . Thoracic aortic aneurysm ([place])   . History of atrial paroxysmal tachycardia   . Compression fracture of L2 lumbar vertebra (HCC)   . Afib ([place])   . Volume overload   . SVT (supraventricular tachycardia) ([place]) 09/05/2010  . Hypothyroidism 09/02/2010  . Essential hypertension 09/02/2010    Past Surgical History:  Procedure Laterality Date  . ABLATION    . APPENDECTOMY    . BREAST SURGERY Right    right biospy  . Classic atrioventricular nodal reentrant  10/20/2010  . COLONOSCOPY    . EUS N/A 02/18/2016   Procedure: UPPER ENDOSCOPIC ULTRASOUND (EUS) LINEAR;  Surgeon: Milus Banister, MD;  Location: [facility];  Service: Endoscopy;  Laterality: N/A;  . EYE SURGERY Bilateral 2003   cataract bilateral  . HERNIA REPAIR  1993   Right Inguinal  . KYPHOPLASTY  09/05/2012   Procedure: KYPHOPLASTY;  Surgeon: Winfield Cunas, MD;  Location: [facility];  Service: Neurosurgery;  Laterality: N/A;  L1 kyphoplasty  . KYPHOPLASTY N/A 04/01/2014   Procedure: KYPHOPLASTY;  Surgeon: Ashok Pall, MD;  Location: [facility] ORS;  Service: Neurosurgery;  Laterality: N/A;  Lumbar Four Kyphoplasty  . KYPHOPLASTY N/A 08/07/2015   Procedure: KYPHOPLASTY LUMBAR THREE;  Surgeon: Ashok Pall, MD;  Location: [facility] NEURO ORS;   Service: Neurosurgery;  Laterality: N/A;  . KYPHOPLASTY N/A 12/09/2015   Procedure: Lumbar five, KYPHOPLASTY;  Surgeon: Ashok Pall, MD;  Location: [facility] NEURO ORS;  Service: Neurosurgery;  Laterality: N/A;  L5 kyphoplasty  . KYPHOPLASTY N/A 01/13/2016   Procedure: Thoracic seven KYPHOPLASTY;  Surgeon: Ashok Pall, MD;  Location: [facility] NEUR[facility] ORS;  Service: Neurosurgery;  Laterality: N/A;  . KYPHOPLASTY N/A 01/22/2016   Procedure: THORACIC EIGHT KYPHOPLASTY;  Surgeon: Ashok Pall, MD;  Location: [facility] ORS;  Service: Neurosurgery;  Laterality: N/A;  T8 KYPHOPLASTY  . L2 kyphoplasty using Kyphon system.  03/17/2009  . PERCUTANEOUS PINNING WRIST FRACTURE Bilateral 1995   bil  . Replacement of ascending aorta and hemiarch and resuspension of the aortic valve with circulatory cardiopulmonary bypass and circulatory arrest  06/25/2010   Dr Servando Snare  . Revision of left total hip replacement using some allograft  11/23/2010  . TOE OSTEOTOMY     Right 2nd   . TONSILLECTOMY    . TUBAL LIGATION  1966    OB History    No data available       Home Medications    Prior to Admission medications   Medication Sig Start Date End Date Taking? Authorizing Provider  acetaminophen (TYLENOL) 500 MG tablet Take 1,000 mg by mouth every 6 (six) hours as needed (For pain.).   Yes Historical Provider, MD  amLODipine (NORVASC) 5 MG tablet TAKE 1 TABLET BY MOUTH  DAILY 07/25/16  Yes Mihai Croitoru, MD  amoxicillin (AMOXIL) 500 MG tablet Take 4 tablets (2gram) by mouth one hour prior to dental work. 07/10/15  Yes Mihai Croitoru, MD  aspirin EC 325 MG tablet Take 325 mg by mouth every evening.    Yes Historical Provider, MD  calcium-vitamin D (OSCAL WITH D) 500-200 MG-UNIT per tablet Take 1 tablet by mouth daily at 12 noon.   Yes Historical Provider, MD  cholecalciferol (VITAMIN D) 1000 UNITS tablet Take 2,000 Units by mouth daily at 12 noon.    Yes Historical Provider, MD  ciprofloxacin (CIPRO) 500 MG tablet Take 1  tablet (500 mg total) by mouth 2 (two) times daily. 08/10/16  Yes Midge Minium, MD  flecainide (TAMBOCOR) 50 MG tablet Take one-half tablet by  mouth two times daily 03/11/16  Yes Mihai Croitoru, MD  ibuprofen (ADVIL,MOTRIN) 200 MG tablet Take 400 mg by mouth every 6 (six) hours as needed (For pain.).   Yes Historical Provider, MD  levothyroxine (SYNTHROID, LEVOTHROID) 50 MCG tablet Take 1 tablet (50 mcg total) by mouth every morning. 05/05/16  Yes Midge Minium, MD  losartan (COZAAR) 50 MG tablet Take 25-50 mg by mouth daily. Take whole tablet in the morning then take half tablet at night 07/05/16  Yes Historical Provider, MD  Multiple Vitamin (MULTIVITAMIN WITH MINERALS) TABS tablet Take 1 tablet by mouth daily.   Yes Historical Provider, MD  Family History Family History  Problem Relation Age of Onset  . Heart failure Mother   . COPD Mother     never smoker  . Stroke Father   . Hypertension Brother   . Hypertension Brother   . Hypertension Sister   . Hypertension Sister   . Stroke Sister   . Stomach cancer      nephew  . Breast cancer      neice    Social History Social History  Substance Use Topics  . Smoking status: Never Smoker  . Smokeless tobacco: Never Used  . Alcohol use No     Allergies   Patient has no known allergies.   Review of Systems Review of Systems  Constitutional: Negative for activity change.  HENT: Negative for congestion.   Cardiovascular: Positive for chest pain.  Gastrointestinal: Negative for abdominal pain.  Genitourinary: Negative for flank pain.  Musculoskeletal: Positive for gait problem. Negative for back pain.  Skin: Positive for wound.  Neurological: Negative for seizures and weakness.  Psychiatric/Behavioral: Negative for agitation.     Physical Exam Updated Vital Signs BP 142/66   Pulse 96   Temp 97.6 F (36.4 C) (Oral)   Resp 22   Ht 5\' 6"  (1.676 m)   Wt 118 lb (53.5 kg)   SpO2 94%   BMI 19.05 kg/m    Physical Exam  Constitutional: She is oriented to person, place, and time. She appears well-developed.  HENT:  Abrasion with small hematoma in the left temporal area.   Eyes: EOM are normal.  Neck: Neck supple.  No midline tenderness. Range of motion intact.  Cardiovascular: Normal rate.   Pulmonary/Chest: Effort normal. She exhibits tenderness.  Tenderness to left lateral chest walls.  Abdominal: Soft. There is no tenderness.  Musculoskeletal: She exhibits tenderness.  Tenderness and deformity to left wrist and hand. There is a abrasions and approximately 2 cm laceration. Pulse intact. No tenderness over the elbow but is in a sling. No tenderness over shoulder. No cervical thoracic or lumbar tenderness. There is tenderness over the left hip and left mid thigh area with decreased range of motion. Both legs are about the same length. Neurovascular intact in both feet. Pain with movement of left hip. No tenderness over the knees.  Neurological: She is alert and oriented to person, place, and time.  Skin: Skin is warm.  Psychiatric: She has a normal mood and affect.     ED Treatments / Results  Labs (all labs ordered are listed, but only abnormal results are displayed) Labs Reviewed  BASIC METABOLIC PANEL - Abnormal; Notable for the following:       Result Value   CO2 21 (*)    Glucose, Bld 190 (*)    BUN 24 (*)    Calcium 8.7 (*)    GFR calc non Af Amer 55 (*)    All other components within normal limits  CBC WITH DIFFERENTIAL/PLATELET - Abnormal; Notable for the following:    WBC 12.6 (*)    RBC 3.62 (*)    Hemoglobin 11.0 (*)    HCT 34.2 (*)    Platelets 113 (*)    Neutro Abs 11.7 (*)    Lymphs Abs 0.6 (*)    All other components within normal limits  PROTIME-INR  CBC  BASIC METABOLIC PANEL  TYPE AND SCREEN    EKG  EKG Interpretation  Date/Time:  Monday August 15 2016 19:40:12 EST Ventricular Rate:  90 PR Interval:  QRS Duration: 93 QT Interval:  379 QTC  Calculation: 464 R Axis:   71 Text Interpretation:  Sinus rhythm Prolonged PR interval Consider left atrial enlargement LVH with secondary repolarization abnormality Anterior Q waves, possibly due to LVH Confirmed by Alvino Chapel  MD, Javeion Cannedy [id number]) on 08/15/2016 8:43:28 PM       Radiology Dg Chest 1 View  Result Date: 08/15/2016 CLINICAL DATA:  Unwitnessed fall today. Was probably on the ground for several hours. EXAM: CHEST 1 VIEW COMPARISON:  08/04/2016 FINDINGS: Fractures of the left third fourth and fifth ribs, mildly displaced. No pneumothorax. No large effusion. Normal mediastinal contours. The lungs are clear. IMPRESSION: Left third through fifth rib fractures. Electronically Signed   By: Andreas Newport M.D.   On: 08/15/2016 21:07   Dg Pelvis 1-2 Views  Result Date: 08/15/2016 CLINICAL DATA:  [place] outside of while walking to or mL boxes afternoon. EXAM: PELVIS - 1-2 VIEW COMPARISON:  12/30/2015 FINDINGS: Chronic fracture deformities of the pubic rami bilaterally. There is superimposed acute fracture of the left femur just below the trochanters and superimposed acute fracture of the superior left pubic ramus. No dislocation. IMPRESSION: Chronic pubic ramus fracture deformities with superimposed acute fractures of the left superior ramus and of the left subtrochanteric femur. Electronically Signed   By: Andreas Newport M.D.   On: 08/15/2016 21:14   Dg Elbow Complete Left  Result Date: 08/15/2016 CLINICAL DATA:  Left elbow injury and pain due to a trip and fall today. Initial encounter. EXAM: LEFT ELBOW - COMPLETE 3+ VIEW COMPARISON:  None. FINDINGS: There is no evidence of fracture, dislocation, or joint effusion. There is no evidence of arthropathy or other focal bone abnormality. Soft tissues are unremarkable. IMPRESSION: Negative exam. Electronically Signed   By: Inge Rise M.D.   On: 08/15/2016 21:05   Dg Wrist Complete Left  Result Date: 08/16/2016 CLINICAL DATA:   82 year old female status post reduction of left wrist fracture. EXAM: LEFT WRIST - COMPLETE 3+ VIEW COMPARISON:  Earlier radiograph dated 08/15/2016 FINDINGS: There has been interval reduction of the angulation and degree of overriding of the distal radial intra-articular and comminuted fracture. The distal ulna fracture appears similar to prior radiograph. Old ulnar-styloid fracture. There has been interval placement of a cast over the distal forearm and wrist. There is osteopenia with osteoarthritic changes of the carpal bones. IMPRESSION: Interval reduction of the degree of angulation of the distal radial fracture in cast placement. Distal ulna fracture appears similar to the prior radiograph. Overall there is reduction of a angulated deformity of the wrist since the prior radiograph. Electronically Signed   By: Anner Crete M.D.   On: 08/16/2016 00:16   Dg Wrist Complete Left  Result Date: 08/15/2016 CLINICAL DATA:  82 y/o  F; status post fall with wrist fracture. EXAM: LEFT HAND - COMPLETE 3+ VIEW; LEFT WRIST - COMPLETE 3+ VIEW COMPARISON:  None. FINDINGS: Left wrist: Intra-articular overriding distal radius acute fracture with comminution, 1 shaft's with volar displacement of the radial shaft relative to the epiphysis, and volar apex angulation. Distal ulnar diaphysis extra-articular mildly displaced and medial and volar apex angulated acute fracture. Chronic ulnar styloid fracture. No radiocarpal dislocation. Left hand: No acute fracture or dislocation of the hand identified. Extensive osteoarthrosis of interphalangeal joints with large periarticular osteophytes. Mild basal joint and moderate triscaphe osteoarthrosis. Bones are demineralized. Chronic fracture deformity with apex dorsal angulation of fifth metacarpal head. IMPRESSION: 1. Intraarticular, comminuted, displaced, angulated, and overriding distal radial acute fracture.  2. Nondisplaced mildly angulated distal ulnar diaphysis acute  fracture. 3. Chronic ulnar styloid fracture. 4. Chronic fifth metacarpal head fracture. 5. Osteoarthrosis of interphalangeal joints, the basal joint, and triscaphe complex. Electronically Signed   By: Kristine Garbe M.D.   On: 08/15/2016 21:14   Ct Head Wo Contrast  Result Date: 08/15/2016 CLINICAL DATA:  Trip and fall injury at 15:45 today. EXAM: CT HEAD WITHOUT CONTRAST TECHNIQUE: Contiguous axial images were obtained from the base of the skull through the vertex without intravenous contrast. COMPARISON:  None. FINDINGS: Brain: There is no intracranial hemorrhage, mass or evidence of acute infarction. There is moderate generalized atrophy. There is moderate chronic microvascular ischemic change. There is no significant extra-axial fluid collection. No acute intracranial findings are evident. Vascular: No hyperdense vessel or unexpected calcification. Skull: Normal. Negative for fracture or focal lesion. Sinuses/Orbits: No acute finding. Other: Small volume fat within the falx cerebri, an incidental finding. IMPRESSION: No acute intracranial findings. There is moderate generalized atrophy and chronic appearing white matter hypodensities which likely represent small vessel ischemic disease. Electronically Signed   By: Andreas Newport M.D.   On: 08/15/2016 21:25   Dg Hand Complete Left  Result Date: 08/15/2016 CLINICAL DATA:  82 y/o  F; status post fall with wrist fracture. EXAM: LEFT HAND - COMPLETE 3+ VIEW; LEFT WRIST - COMPLETE 3+ VIEW COMPARISON:  None. FINDINGS: Left wrist: Intra-articular overriding distal radius acute fracture with comminution, 1 shaft's with volar displacement of the radial shaft relative to the epiphysis, and volar apex angulation. Distal ulnar diaphysis extra-articular mildly displaced and medial and volar apex angulated acute fracture. Chronic ulnar styloid fracture. No radiocarpal dislocation. Left hand: No acute fracture or dislocation of the hand identified.  Extensive osteoarthrosis of interphalangeal joints with large periarticular osteophytes. Mild basal joint and moderate triscaphe osteoarthrosis. Bones are demineralized. Chronic fracture deformity with apex dorsal angulation of fifth metacarpal head. IMPRESSION: 1. Intraarticular, comminuted, displaced, angulated, and overriding distal radial acute fracture. 2. Nondisplaced mildly angulated distal ulnar diaphysis acute fracture. 3. Chronic ulnar styloid fracture. 4. Chronic fifth metacarpal head fracture. 5. Osteoarthrosis of interphalangeal joints, the basal joint, and triscaphe complex. Electronically Signed   By: Kristine Garbe M.D.   On: 08/15/2016 21:14   Dg Femur Min 2 Views Left  Result Date: 08/15/2016 CLINICAL DATA:  Left hip pain due to a trip and fall today. EXAM: LEFT FEMUR 2 VIEWS COMPARISON:  None. FINDINGS: Total left hip arthroplasty is in place. The patient has an acute fracture of the subtrochanteric left femur. The fracture may extend into the greater trochanter. There is also a left superior pubic ramus fracture. The left hip is located. IMPRESSION: Acute subtrochanteric left hip fracture with a total hip arthroplasty in place. The fracture may extend to the greater trochanter. Acute left superior pubic ramus fracture. Electronically Signed   By: Inge Rise M.D.   On: 08/15/2016 21:09    Procedures Procedures (including critical care time)  Medications Ordered in ED Medications  fentaNYL (SUBLIMAZE) injection (50 mcg Intravenous Given 08/15/16 2334)  losartan (COZAAR) tablet 50 mg (not administered)  amLODipine (NORVASC) tablet 5 mg (not administered)  levothyroxine (SYNTHROID, LEVOTHROID) tablet 50 mcg (not administered)  acetaminophen (TYLENOL) tablet 1,000 mg (not administered)  flecainide (TAMBOCOR) tablet 25 mg (not administered)  dextrose 5 %-0.9 % sodium chloride infusion (not administered)  HYDROmorphone (DILAUDID) injection 1 mg (not administered)    HYDROcodone-acetaminophen (NORCO/VICODIN) 5-325 MG per tablet 2 tablet (not administered)  ondansetron (ZOFRAN) tablet  4 mg (not administered)    Or  ondansetron (ZOFRAN) injection 4 mg (not administered)  losartan (COZAAR) tablet 25 mg (not administered)  fentaNYL (SUBLIMAZE) injection 50 mcg (50 mcg Intravenous Given 08/15/16 1949)  Tdap (BOOSTRIX) injection 0.5 mL (0.5 mLs Intramuscular Given 08/15/16 2115)  fentaNYL (SUBLIMAZE) injection 50 mcg (50 mcg Intravenous Given 08/15/16 2111)  ondansetron (ZOFRAN) injection 4 mg (4 mg Intravenous Given 08/15/16 2113)  fentaNYL (SUBLIMAZE) injection 50 mcg (50 mcg Intravenous Given 08/15/16 2318)  bupivacaine (MARCAINE) 0.5 % injection 5 mL (5 mLs Infiltration Given by Other 08/16/16 0015)     Initial Impression / Assessment and Plan / ED Course  I have reviewed the triage vital signs and the nursing notes.  Pertinent labs & imaging results that were available during my care of the patient were reviewed by me and considered in my medical decision making (see chart for details).  Clinical Course     Patient with fall. Numerous injuries. Distal radius and distal ulna fracture. Also periprosthetic left hip fracture. Discussed with Dr. Rolena Infante from [facility] orthopedics under patient and family request is Dr. Alvan Dame and on this most recent hip surgery. Said discuss with hand surgery who was Dr. Percell Miller today about whether something needs to be done acutely and if it is either he or I could do some of it. If does not need acute intervention Dr. Amedeo Plenty will manage it tomorrow or the next day. Patient will be admitted to trauma surgery.   Final Clinical Impressions(s) / ED Diagnoses   Final diagnoses:  Fall, initial encounter  Closed fracture of distal end of left radius, unspecified fracture morphology, initial encounter  Closed fracture of distal end of left ulna, unspecified fracture morphology, initial encounter  Closed fracture of multiple  ribs of left side, initial encounter  Periprosthetic hip fracture, initial encounter    New Prescriptions New Prescriptions   No medications on file     Davonna Belling, MD 08/16/16 [id number]

## 2016-08-15 NOTE — ED Triage Notes (Signed)
Pt arrives to the ED via EMS with complaints of fall/wrist fx. Per ems pt was walking to her mailbox and tripped at approx 1545. Pt was found on the ground approx 1800. Per ems pt c/o L wrist pain, L hip pain and chest discomfort after the fall. Per ems no LOC. Pt given 176mcg Fentanyl pta.

## 2016-08-16 ENCOUNTER — Inpatient Hospital Stay (HOSPITAL_COMMUNITY): Payer: Medicare Other

## 2016-08-16 ENCOUNTER — Encounter (HOSPITAL_COMMUNITY): Payer: Self-pay | Admitting: Emergency Medicine

## 2016-08-16 DIAGNOSIS — Z23 Encounter for immunization: Secondary | ICD-10-CM | POA: Diagnosis not present

## 2016-08-16 DIAGNOSIS — S52602A Unspecified fracture of lower end of left ulna, initial encounter for closed fracture: Secondary | ICD-10-CM | POA: Diagnosis present

## 2016-08-16 DIAGNOSIS — I351 Nonrheumatic aortic (valve) insufficiency: Secondary | ICD-10-CM | POA: Diagnosis present

## 2016-08-16 DIAGNOSIS — I472 Ventricular tachycardia: Secondary | ICD-10-CM | POA: Diagnosis not present

## 2016-08-16 DIAGNOSIS — S62102A Fracture of unspecified carpal bone, left wrist, initial encounter for closed fracture: Secondary | ICD-10-CM | POA: Diagnosis not present

## 2016-08-16 DIAGNOSIS — S32512A Fracture of superior rim of left pubis, initial encounter for closed fracture: Secondary | ICD-10-CM | POA: Diagnosis not present

## 2016-08-16 DIAGNOSIS — M9702XA Periprosthetic fracture around internal prosthetic left hip joint, initial encounter: Secondary | ICD-10-CM | POA: Diagnosis present

## 2016-08-16 DIAGNOSIS — K219 Gastro-esophageal reflux disease without esophagitis: Secondary | ICD-10-CM | POA: Diagnosis present

## 2016-08-16 DIAGNOSIS — R55 Syncope and collapse: Secondary | ICD-10-CM | POA: Diagnosis not present

## 2016-08-16 DIAGNOSIS — I471 Supraventricular tachycardia: Secondary | ICD-10-CM | POA: Diagnosis present

## 2016-08-16 DIAGNOSIS — S2242XA Multiple fractures of ribs, left side, initial encounter for closed fracture: Secondary | ICD-10-CM | POA: Diagnosis present

## 2016-08-16 DIAGNOSIS — Z79899 Other long term (current) drug therapy: Secondary | ICD-10-CM | POA: Diagnosis not present

## 2016-08-16 DIAGNOSIS — S61412A Laceration without foreign body of left hand, initial encounter: Secondary | ICD-10-CM | POA: Diagnosis present

## 2016-08-16 DIAGNOSIS — E039 Hypothyroidism, unspecified: Secondary | ICD-10-CM | POA: Diagnosis present

## 2016-08-16 DIAGNOSIS — S32592A Other specified fracture of left pubis, initial encounter for closed fracture: Secondary | ICD-10-CM | POA: Diagnosis present

## 2016-08-16 DIAGNOSIS — M810 Age-related osteoporosis without current pathological fracture: Secondary | ICD-10-CM | POA: Diagnosis present

## 2016-08-16 DIAGNOSIS — S7225XA Nondisplaced subtrochanteric fracture of left femur, initial encounter for closed fracture: Secondary | ICD-10-CM | POA: Diagnosis not present

## 2016-08-16 DIAGNOSIS — Z8249 Family history of ischemic heart disease and other diseases of the circulatory system: Secondary | ICD-10-CM | POA: Diagnosis not present

## 2016-08-16 DIAGNOSIS — W19XXXS Unspecified fall, sequela: Secondary | ICD-10-CM | POA: Diagnosis not present

## 2016-08-16 DIAGNOSIS — Z8679 Personal history of other diseases of the circulatory system: Secondary | ICD-10-CM | POA: Diagnosis not present

## 2016-08-16 DIAGNOSIS — I341 Nonrheumatic mitral (valve) prolapse: Secondary | ICD-10-CM | POA: Diagnosis present

## 2016-08-16 DIAGNOSIS — S7222XA Displaced subtrochanteric fracture of left femur, initial encounter for closed fracture: Secondary | ICD-10-CM | POA: Diagnosis present

## 2016-08-16 DIAGNOSIS — I10 Essential (primary) hypertension: Secondary | ICD-10-CM | POA: Diagnosis present

## 2016-08-16 DIAGNOSIS — S52572A Other intraarticular fracture of lower end of left radius, initial encounter for closed fracture: Secondary | ICD-10-CM | POA: Diagnosis present

## 2016-08-16 DIAGNOSIS — Z7982 Long term (current) use of aspirin: Secondary | ICD-10-CM | POA: Diagnosis not present

## 2016-08-16 DIAGNOSIS — W19XXXA Unspecified fall, initial encounter: Secondary | ICD-10-CM | POA: Diagnosis present

## 2016-08-16 DIAGNOSIS — W1830XA Fall on same level, unspecified, initial encounter: Secondary | ICD-10-CM | POA: Diagnosis present

## 2016-08-16 LAB — CBC
HCT: 30.8 % — ABNORMAL LOW (ref 36.0–46.0)
Hemoglobin: 9.9 g/dL — ABNORMAL LOW (ref 12.0–15.0)
MCH: 30.7 pg (ref 26.0–34.0)
MCHC: 32.1 g/dL (ref 30.0–36.0)
MCV: 95.7 fL (ref 78.0–100.0)
PLATELETS: 96 10*3/uL — AB (ref 150–400)
RBC: 3.22 MIL/uL — AB (ref 3.87–5.11)
RDW: 14.5 % (ref 11.5–15.5)
WBC: 8.3 10*3/uL (ref 4.0–10.5)

## 2016-08-16 LAB — BASIC METABOLIC PANEL
ANION GAP: 7 (ref 5–15)
BUN: 19 mg/dL (ref 6–20)
CALCIUM: 8.4 mg/dL — AB (ref 8.9–10.3)
CO2: 25 mmol/L (ref 22–32)
Chloride: 108 mmol/L (ref 101–111)
Creatinine, Ser: 0.9 mg/dL (ref 0.44–1.00)
GFR calc Af Amer: >60 mL/min (ref >60)
GFR, EST NON AFRICAN AMERICAN: 55 mL/min — AB (ref >60)
Glucose, Bld: 199 mg/dL — ABNORMAL HIGH (ref 65–99)
POTASSIUM: 4.5 mmol/L (ref 3.5–5.1)
SODIUM: 140 mmol/L (ref 135–145)

## 2016-08-16 LAB — MRSA PCR SCREENING: MRSA BY PCR: NEGATIVE

## 2016-08-16 MED ORDER — HYDROMORPHONE HCL 2 MG/ML IJ SOLN: 1.0000 mg | INTRAMUSCULAR | Status: DC | PRN

## 2016-08-16 MED ORDER — LEVOTHYROXINE SODIUM 50 MCG PO TABS
50.0000 ug | ORAL_TABLET | Freq: Every day | ORAL | Status: DC
  Administered 2016-08-16 – 2016-08-20 (×5): 50 ug via ORAL
  Filled 2016-08-16 (×5): qty 1

## 2016-08-16 MED ORDER — ONDANSETRON HCL 4 MG/2ML IJ SOLN
4.0000 mg | Freq: Four times a day (QID) | INTRAMUSCULAR | Status: DC | PRN
  Administered 2016-08-16 – 2016-08-17 (×2): 4 mg via INTRAVENOUS
  Filled 2016-08-16 (×2): qty 2

## 2016-08-16 MED ORDER — CIPROFLOXACIN HCL 500 MG PO TABS: 500.0000 mg | ORAL_TABLET | Freq: Two times a day (BID) | ORAL | Status: DC

## 2016-08-16 MED ORDER — DEXTROSE-NACL 5-0.9 % IV SOLN
INTRAVENOUS | Status: DC
  Administered 2016-08-16 – 2016-08-17 (×4): via INTRAVENOUS

## 2016-08-16 MED ORDER — ACETAMINOPHEN 500 MG PO TABS: 1000.0000 mg | ORAL_TABLET | Freq: Four times a day (QID) | ORAL | Status: DC | PRN

## 2016-08-16 MED ORDER — FLECAINIDE ACETATE 50 MG PO TABS
25.0000 mg | ORAL_TABLET | Freq: Two times a day (BID) | ORAL | Status: DC
  Administered 2016-08-16 – 2016-08-19 (×8): 25 mg via ORAL
  Filled 2016-08-16 (×11): qty 1

## 2016-08-16 MED ORDER — CIPROFLOXACIN HCL 500 MG PO TABS
500.0000 mg | ORAL_TABLET | Freq: Two times a day (BID) | ORAL | Status: AC
  Administered 2016-08-16 – 2016-08-18 (×6): 500 mg via ORAL
  Filled 2016-08-16 (×6): qty 1

## 2016-08-16 MED ORDER — ONDANSETRON HCL 4 MG PO TABS: 4.0000 mg | ORAL_TABLET | Freq: Four times a day (QID) | ORAL | Status: DC | PRN

## 2016-08-16 MED ORDER — ORAL CARE MOUTH RINSE
15.0000 mL | Freq: Two times a day (BID) | OROMUCOSAL | Status: DC
  Administered 2016-08-16 – 2016-08-18 (×3): 15 mL via OROMUCOSAL

## 2016-08-16 MED ORDER — AMLODIPINE BESYLATE 5 MG PO TABS
5.0000 mg | ORAL_TABLET | Freq: Every day | ORAL | Status: DC
  Administered 2016-08-16 – 2016-08-20 (×4): 5 mg via ORAL
  Filled 2016-08-16 (×5): qty 1

## 2016-08-16 MED ORDER — TRAMADOL HCL 50 MG PO TABS
50.0000 mg | ORAL_TABLET | Freq: Four times a day (QID) | ORAL | Status: DC
  Administered 2016-08-16 – 2016-08-20 (×15): 50 mg via ORAL
  Filled 2016-08-16 (×16): qty 1

## 2016-08-16 MED ORDER — LOSARTAN POTASSIUM 50 MG PO TABS
25.0000 mg | ORAL_TABLET | Freq: Every day | ORAL | Status: DC
  Administered 2016-08-16 – 2016-08-19 (×4): 25 mg via ORAL
  Filled 2016-08-16 (×4): qty 1

## 2016-08-16 MED ORDER — HYDROMORPHONE HCL 1 MG/ML IJ SOLN
1.0000 mg | INTRAMUSCULAR | Status: DC | PRN
  Administered 2016-08-16 – 2016-08-17 (×2): 1 mg via INTRAVENOUS
  Filled 2016-08-16 (×2): qty 1

## 2016-08-16 MED ORDER — LOSARTAN POTASSIUM 50 MG PO TABS
50.0000 mg | ORAL_TABLET | Freq: Every day | ORAL | Status: DC
  Administered 2016-08-16 – 2016-08-20 (×5): 50 mg via ORAL
  Filled 2016-08-16 (×5): qty 1

## 2016-08-16 MED ORDER — HYDROCODONE-ACETAMINOPHEN 5-325 MG PO TABS
2.0000 | ORAL_TABLET | ORAL | Status: DC | PRN
  Administered 2016-08-17: 2 via ORAL
  Filled 2016-08-16: qty 2

## 2016-08-16 NOTE — Consult Note (Signed)
Yvonne Asa, MD Chief Complaint: S/p fall with multiple ortho issues History:  Patient is a 82 year old female who arrived to [facility] ER secondary to a single fall. Patient states she was walking on the driveway and fell onto her left side. Patient presented to the ER and underwent routine workup. Patient was found to have a left distal both bone forearm fracture, left rib fractures, left periprosthesis hip fracture.  Patient has had a history of a previous left femur fracture with a revision. This was by Dr. Alvan Dame. Patient also has a previous history of a ruptured thoracic aneurysm which was repaired approximately 5 years ago.  Patient sees Dr. Sanda Klein as her cardiologist. Past Medical History:  Diagnosis Date  . Acute blood loss anemia 2011   WITH ANEURYSM ONLY  . Afib ([place])    -no   . Arthritis   . Cancer ([place]) LATE 1970'S   skin cancer - arm squamous  . Compression fracture of L2 lumbar vertebra ([facility])   . GERD (gastroesophageal reflux disease)    "mild"  . Heart murmur   . History of atrial paroxysmal tachycardia   . History of blood transfusion   . History of hypothyroidism   . Hypertension   . Hypothyroidism   . Mitral valve prolapse   . Moderate aortic insufficiency 05/25/2014  . Osteoporosis   . Pericardial tamponade 05/2010  . Pneumonia    many years ago  . Premature atrial contractions   . Syncope    VERY RARE  . Thoracic aortic aneurysm ([place]) 05/2010   Ruptured ascending  . UTI (lower urinary tract infection)    CHRONIC  . Volume overload 2011    No Known Allergies  No current facility-administered medications on file prior to encounter.    Current Outpatient Prescriptions on File Prior to Encounter  Medication Sig Dispense Refill  . acetaminophen (TYLENOL) 500 MG tablet Take 1,000 mg by mouth every 6 (six) hours as needed (For pain.).    Marland Kitchen amLODipine (NORVASC) 5 MG tablet TAKE 1 TABLET BY MOUTH  DAILY 90 tablet 2  . amoxicillin (AMOXIL)  500 MG tablet Take 4 tablets (2gram) by mouth one hour prior to dental work. 20 tablet 0  . aspirin EC 325 MG tablet Take 325 mg by mouth every evening.     . calcium-vitamin D (OSCAL WITH D) 500-200 MG-UNIT per tablet Take 1 tablet by mouth daily at 12 noon.    . cholecalciferol (VITAMIN D) 1000 UNITS tablet Take 2,000 Units by mouth daily at 12 noon.     . ciprofloxacin (CIPRO) 500 MG tablet Take 1 tablet (500 mg total) by mouth 2 (two) times daily. 14 tablet 0  . flecainide (TAMBOCOR) 50 MG tablet Take one-half tablet by  mouth two times daily 90 tablet 2  . ibuprofen (ADVIL,MOTRIN) 200 MG tablet Take 400 mg by mouth every 6 (six) hours as needed (For pain.).    Marland Kitchen levothyroxine (SYNTHROID, LEVOTHROID) 50 MCG tablet Take 1 tablet (50 mcg total) by mouth every morning. 90 tablet 1  . losartan (COZAAR) 50 MG tablet Take 25-50 mg by mouth daily. Take whole tablet in the morning then take half tablet at night      Physical Exam: Vitals:   08/16/16 0500 08/16/16 0600  BP: (!) 145/62 (!) 141/62  Pulse: 81 83  Resp: 19 13  Temp:    A+OX3 No SOB.  Lateral chest pain secondary to rib fractures Abd soft/NT EHL/TA/GA intact bilaterally.  Sensation  to LT intact bilaterally Compartments soft/NT 1+ DP/PT pulses Splint left UE.  Cap refill < 2 sec  Image: Dg Chest 1 View  Result Date: 08/15/2016 CLINICAL DATA:  Unwitnessed fall today. Was probably on the ground for several hours. EXAM: CHEST 1 VIEW COMPARISON:  08/04/2016 FINDINGS: Fractures of the left third fourth and fifth ribs, mildly displaced. No pneumothorax. No large effusion. Normal mediastinal contours. The lungs are clear. IMPRESSION: Left third through fifth rib fractures. Electronically Signed   By: Andreas Newport M.D.   On: 08/15/2016 21:07   Dg Pelvis 1-2 Views  Result Date: 08/15/2016 CLINICAL DATA:  [place] outside of while walking to or mL boxes afternoon. EXAM: PELVIS - 1-2 VIEW COMPARISON:  12/30/2015 FINDINGS: Chronic  fracture deformities of the pubic rami bilaterally. There is superimposed acute fracture of the left femur just below the trochanters and superimposed acute fracture of the superior left pubic ramus. No dislocation. IMPRESSION: Chronic pubic ramus fracture deformities with superimposed acute fractures of the left superior ramus and of the left subtrochanteric femur. Electronically Signed   By: Andreas Newport M.D.   On: 08/15/2016 21:14   Dg Elbow Complete Left  Result Date: 08/15/2016 CLINICAL DATA:  Left elbow injury and pain due to a trip and fall today. Initial encounter. EXAM: LEFT ELBOW - COMPLETE 3+ VIEW COMPARISON:  None. FINDINGS: There is no evidence of fracture, dislocation, or joint effusion. There is no evidence of arthropathy or other focal bone abnormality. Soft tissues are unremarkable. IMPRESSION: Negative exam. Electronically Signed   By: Inge Rise M.D.   On: 08/15/2016 21:05   Dg Wrist Complete Left  Result Date: 08/16/2016 CLINICAL DATA:  82 year old female status post reduction of left wrist fracture. EXAM: LEFT WRIST - COMPLETE 3+ VIEW COMPARISON:  Earlier radiograph dated 08/15/2016 FINDINGS: There has been interval reduction of the angulation and degree of overriding of the distal radial intra-articular and comminuted fracture. The distal ulna fracture appears similar to prior radiograph. Old ulnar-styloid fracture. There has been interval placement of a cast over the distal forearm and wrist. There is osteopenia with osteoarthritic changes of the carpal bones. IMPRESSION: Interval reduction of the degree of angulation of the distal radial fracture in cast placement. Distal ulna fracture appears similar to the prior radiograph. Overall there is reduction of a angulated deformity of the wrist since the prior radiograph. Electronically Signed   By: Anner Crete M.D.   On: 08/16/2016 00:16   Dg Wrist Complete Left  Result Date: 08/15/2016 CLINICAL DATA:  82 y/o  F;  status post fall with wrist fracture. EXAM: LEFT HAND - COMPLETE 3+ VIEW; LEFT WRIST - COMPLETE 3+ VIEW COMPARISON:  None. FINDINGS: Left wrist: Intra-articular overriding distal radius acute fracture with comminution, 1 shaft's with volar displacement of the radial shaft relative to the epiphysis, and volar apex angulation. Distal ulnar diaphysis extra-articular mildly displaced and medial and volar apex angulated acute fracture. Chronic ulnar styloid fracture. No radiocarpal dislocation. Left hand: No acute fracture or dislocation of the hand identified. Extensive osteoarthrosis of interphalangeal joints with large periarticular osteophytes. Mild basal joint and moderate triscaphe osteoarthrosis. Bones are demineralized. Chronic fracture deformity with apex dorsal angulation of fifth metacarpal head. IMPRESSION: 1. Intraarticular, comminuted, displaced, angulated, and overriding distal radial acute fracture. 2. Nondisplaced mildly angulated distal ulnar diaphysis acute fracture. 3. Chronic ulnar styloid fracture. 4. Chronic fifth metacarpal head fracture. 5. Osteoarthrosis of interphalangeal joints, the basal joint, and triscaphe complex. Electronically Signed   By: Mia Creek  [place]-Stratton M.D.   On: 08/15/2016 21:14   Ct Head Wo Contrast  Result Date: 08/15/2016 CLINICAL DATA:  Trip and fall injury at 15:45 today. EXAM: CT HEAD WITHOUT CONTRAST TECHNIQUE: Contiguous axial images were obtained from the base of the skull through the vertex without intravenous contrast. COMPARISON:  None. FINDINGS: Brain: There is no intracranial hemorrhage, mass or evidence of acute infarction. There is moderate generalized atrophy. There is moderate chronic microvascular ischemic change. There is no significant extra-axial fluid collection. No acute intracranial findings are evident. Vascular: No hyperdense vessel or unexpected calcification. Skull: Normal. Negative for fracture or focal lesion. Sinuses/Orbits: No acute  finding. Other: Small volume fat within the falx cerebri, an incidental finding. IMPRESSION: No acute intracranial findings. There is moderate generalized atrophy and chronic appearing white matter hypodensities which likely represent small vessel ischemic disease. Electronically Signed   By: Andreas Newport M.D.   On: 08/15/2016 21:25   Ct Pelvis Wo Contrast  Result Date: 08/16/2016 CLINICAL DATA:  82 year old female with fall and left hip pain. EXAM: CT PELVIS AND LEFT LOWER EXTREMITY WITHOUT CONTRAST TECHNIQUE: Multidetector CT imaging of the pelvis and left lower extremity was performed according to the standard protocol without intravenous contrast. Multiplanar CT image reconstructions were also generated. COMPARISON:  Left femur radiograph dated 08/15/2016 FINDINGS: Evaluation of the pelvic structures is limited in the absence of contrast as well as due to streak artifact caused by lumbar vertebroplasty. CT PELVIS FINDINGS Urinary Tract: There is mild fullness of the visualized inferior portion of the left kidney. The right kidney is not visualized. The urinary bladder is mildly distended and grossly unremarkable. Bowel: Moderate stool noted within the colon. There is sigmoid diverticulosis without active inflammatory changes. No bowel dilatation noted within the pelvis. The appendix is not visualized with certainty. No inflammatory changes identified in the right lower quadrant. Vascular/Lymphatic: There is advanced aortoiliac atherosclerotic disease. Evaluation of the vessels is limited in the absence of intravenous contrast. Reproductive:  The uterus is grossly unremarkable. Other:  None Musculoskeletal: There is advanced osteopenia which limits evaluation for fracture. Multilevel old-appearing lower lumbar compression deformities and vertebroplasty changes. There is extrusion of small amount of the vertebroplasty cement along the posterior cortex of the L5. There is cortical discontinuity of the  inferior aspect of the left sacral alum (coronal series 203, image 86) concerning for an age indeterminate fracture. There are old healed right pubic bone fractures. There is a minimally displaced comminuted acute fracture of the left superior pubic ramus as well as a nondisplaced fracture of the left inferior pubic ramus. The symphysis pubis appears intact. CT LEFT LOWER EXTREMITY FINDINGS Bones/Joint/Cartilage There is a total left hip arthroplasty. The arthroplasty components appear intact and diffuse anatomic alignment. There is a comminuted appearing nondisplaced sub trochanteric fracture with extension of the fracture in greater trochanter of the femur. The fracture involves the proximal femoral diaphysis. Evaluation of the fracture is very limited due to advanced osteopenia as well as streak artifact caused by orthopedic hardware. The femoral component of the arthroplasty appears to be maintained a satisfactory contact with the adjacent bone. No definite lucency identified adjacent to the hardware. There is no fracture or dislocation of the left knee. Ligaments Suboptimally assessed by CT. Muscles and Tendons Probable mild soft tissue edema in the anterior compartment of the left femur involving the quadriceps musculature. No fluid collection or large hematoma. Soft tissues No large fluid collection or hematoma. IMPRESSION: Fractures of the left pubic rami as well  as an age indeterminate fracture of the inferior aspect of the left sacral alum. Nondisplaced comminuted left femoral subtrochanteric fracture with extension into the greater trochanter. The orthopedic hardware appears intact and in anatomic alignment. No dislocation. Electronically Signed   By: Anner Crete M.D.   On: 08/16/2016 01:30   Ct Femur Left Wo Contrast  Result Date: 08/16/2016 CLINICAL DATA:  82 year old female with fall and left hip pain. EXAM: CT PELVIS AND LEFT LOWER EXTREMITY WITHOUT CONTRAST TECHNIQUE: Multidetector CT  imaging of the pelvis and left lower extremity was performed according to the standard protocol without intravenous contrast. Multiplanar CT image reconstructions were also generated. COMPARISON:  Left femur radiograph dated 08/15/2016 FINDINGS: Evaluation of the pelvic structures is limited in the absence of contrast as well as due to streak artifact caused by lumbar vertebroplasty. CT PELVIS FINDINGS Urinary Tract: There is mild fullness of the visualized inferior portion of the left kidney. The right kidney is not visualized. The urinary bladder is mildly distended and grossly unremarkable. Bowel: Moderate stool noted within the colon. There is sigmoid diverticulosis without active inflammatory changes. No bowel dilatation noted within the pelvis. The appendix is not visualized with certainty. No inflammatory changes identified in the right lower quadrant. Vascular/Lymphatic: There is advanced aortoiliac atherosclerotic disease. Evaluation of the vessels is limited in the absence of intravenous contrast. Reproductive:  The uterus is grossly unremarkable. Other:  None Musculoskeletal: There is advanced osteopenia which limits evaluation for fracture. Multilevel old-appearing lower lumbar compression deformities and vertebroplasty changes. There is extrusion of small amount of the vertebroplasty cement along the posterior cortex of the L5. There is cortical discontinuity of the inferior aspect of the left sacral alum (coronal series 203, image 86) concerning for an age indeterminate fracture. There are old healed right pubic bone fractures. There is a minimally displaced comminuted acute fracture of the left superior pubic ramus as well as a nondisplaced fracture of the left inferior pubic ramus. The symphysis pubis appears intact. CT LEFT LOWER EXTREMITY FINDINGS Bones/Joint/Cartilage There is a total left hip arthroplasty. The arthroplasty components appear intact and diffuse anatomic alignment. There is a  comminuted appearing nondisplaced sub trochanteric fracture with extension of the fracture in greater trochanter of the femur. The fracture involves the proximal femoral diaphysis. Evaluation of the fracture is very limited due to advanced osteopenia as well as streak artifact caused by orthopedic hardware. The femoral component of the arthroplasty appears to be maintained a satisfactory contact with the adjacent bone. No definite lucency identified adjacent to the hardware. There is no fracture or dislocation of the left knee. Ligaments Suboptimally assessed by CT. Muscles and Tendons Probable mild soft tissue edema in the anterior compartment of the left femur involving the quadriceps musculature. No fluid collection or large hematoma. Soft tissues No large fluid collection or hematoma. IMPRESSION: Fractures of the left pubic rami as well as an age indeterminate fracture of the inferior aspect of the left sacral alum. Nondisplaced comminuted left femoral subtrochanteric fracture with extension into the greater trochanter. The orthopedic hardware appears intact and in anatomic alignment. No dislocation. Electronically Signed   By: Anner Crete M.D.   On: 08/16/2016 01:30   Dg Hand Complete Left  Result Date: 08/15/2016 CLINICAL DATA:  82 y/o  F; status post fall with wrist fracture. EXAM: LEFT HAND - COMPLETE 3+ VIEW; LEFT WRIST - COMPLETE 3+ VIEW COMPARISON:  None. FINDINGS: Left wrist: Intra-articular overriding distal radius acute fracture with comminution, 1 shaft's with volar displacement  of the radial shaft relative to the epiphysis, and volar apex angulation. Distal ulnar diaphysis extra-articular mildly displaced and medial and volar apex angulated acute fracture. Chronic ulnar styloid fracture. No radiocarpal dislocation. Left hand: No acute fracture or dislocation of the hand identified. Extensive osteoarthrosis of interphalangeal joints with large periarticular osteophytes. Mild basal joint and  moderate triscaphe osteoarthrosis. Bones are demineralized. Chronic fracture deformity with apex dorsal angulation of fifth metacarpal head. IMPRESSION: 1. Intraarticular, comminuted, displaced, angulated, and overriding distal radial acute fracture. 2. Nondisplaced mildly angulated distal ulnar diaphysis acute fracture. 3. Chronic ulnar styloid fracture. 4. Chronic fifth metacarpal head fracture. 5. Osteoarthrosis of interphalangeal joints, the basal joint, and triscaphe complex. Electronically Signed   By: Kristine Garbe M.D.   On: 08/15/2016 21:14   Ct Angio Chest Aorta W &/or Wo Contrast  Result Date: 08/04/2016 CLINICAL DATA:  Thoracic aortic aneurysm, status post graft repair 6 years ago. EXAM: CT ANGIOGRAPHY CHEST WITH CONTRAST TECHNIQUE: Multidetector CT imaging of the chest was performed using the standard protocol during bolus administration of intravenous contrast. Multiplanar CT image reconstructions and MIPs were obtained to evaluate the vascular anatomy. CONTRAST:  75 cc Isovue 370 IV COMPARISON:  07/05/2015 FINDINGS: Cardiovascular: Status post graft repair of the ascending thoracic aorta. Continued dilatation. The proximal ascending aorta measures 4.6 cm, stable. The distal ascending aorta measures 5.8 cm compared to 6 cm previously. No change. No dissection. Heart is enlarged. Prior median sternotomy. Calcifications in the left main and left anterior descending coronary arteries. Mediastinum/Nodes: HO No mediastinal, hilar, or axillary adenopathy. Lungs/Pleura: Scarring in the apices, stable. No confluent airspace opacities, suspicious pulmonary nodules or pleural effusions. Upper Abdomen: Imaging into the upper abdomen shows no acute findings. Musculoskeletal: Chest wall soft tissues are unremarkable. No acute bony abnormality or focal bone lesion. Changes of multi level vertebral augmentation in the thoracic and upper lumbar spine. Stable severe compression fracture at T12 without  vertebral augmentation changes. Review of the MIP images confirms the above findings. IMPRESSION: Stable dilatation of the ascending thoracic aorta, status post graft repair. No dissection. No change since prior study. Biapical scarring. Coronary artery disease. Multilevel compression fractures status post vertebral augmentation. Electronically Signed   By: Rolm Baptise M.D.   On: 08/04/2016 13:01   Dg Femur Min 2 Views Left  Result Date: 08/15/2016 CLINICAL DATA:  Left hip pain due to a trip and fall today. EXAM: LEFT FEMUR 2 VIEWS COMPARISON:  None. FINDINGS: Total left hip arthroplasty is in place. The patient has an acute fracture of the subtrochanteric left femur. The fracture may extend into the greater trochanter. There is also a left superior pubic ramus fracture. The left hip is located. IMPRESSION: Acute subtrochanteric left hip fracture with a total hip arthroplasty in place. The fracture may extend to the greater trochanter. Acute left superior pubic ramus fracture. Electronically Signed   By: Inge Rise M.D.   On: 08/15/2016 21:09    A/P: Patient s/p fall with complaints of left groin, hip, and wrist pain. CT and xrays reviewed.  Patient with left subtroch femur fracture.  No displacement of hip prosthesis.  Fracture appears stable without disruption of THA.  Will review images with Dr. Alvan Dame to determine if surgical management is required. Will defer management of wrist fracture to ortho hand on call MD.  Patient had requested Dr. Amedeo Plenty - will check if either he or Dr Apolonio Schneiders are available for consultation.   Pubic rami fracture are stable and do not  require fixation surgical management. DVT prevention per trauma service Further recommendations pending.

## 2016-08-16 NOTE — Consult Note (Signed)
Reason for Consult: Left comminuted wrist fracture Referring Physician: Trauma surgery  Yvonne [name] is an 82 y.o. female.  HPI: Pleasant female status post fall with significant comminuted complex radius fracture left upper extremity. She has swelling. She is sensate. She's here with her family at bedside. She also has associated pelvic and greater trochanteric fractures about her hip which will be managed conservatively  She is alert and oriented. She states she has good sensation in her hand.  I reviewed her chart at great length.  Her lower extremities are tender but she moves her legs nicely.  Past Medical History:  Diagnosis Date  . Acute blood loss anemia 2011   WITH ANEURYSM ONLY  . Afib ([place])    -no   . Arthritis   . Cancer ([place]) LATE 1970'S   skin cancer - arm squamous  . Compression fracture of L2 lumbar vertebra ([facility])   . GERD (gastroesophageal reflux disease)    "mild"  . Heart murmur   . History of atrial paroxysmal tachycardia   . History of blood transfusion   . History of hypothyroidism   . Hypertension   . Hypothyroidism   . Mitral valve prolapse   . Moderate aortic insufficiency 05/25/2014  . Osteoporosis   . Pericardial tamponade 05/2010  . Pneumonia    many years ago  . Premature atrial contractions   . Syncope    VERY RARE  . Thoracic aortic aneurysm ([facility]) 05/2010   Ruptured ascending  . UTI (lower urinary tract infection)    CHRONIC  . Volume overload 2011    Past Surgical History:  Procedure Laterality Date  . ABLATION    . APPENDECTOMY    . BREAST SURGERY Right    right biospy  . Classic atrioventricular nodal reentrant  10/20/2010  . COLONOSCOPY    . EUS N/A 02/18/2016   Procedure: UPPER ENDOSCOPIC ULTRASOUND (EUS) LINEAR;  Surgeon: Milus Banister, MD;  Location: [facility];  Service: Endoscopy;  Laterality: N/A;  . EYE SURGERY Bilateral 2003   cataract bilateral  . HERNIA REPAIR  1993   Right Inguinal  . KYPHOPLASTY  09/05/2012    Procedure: KYPHOPLASTY;  Surgeon: Winfield Cunas, MD;  Location: [place] [facility]S;  Service: Neurosurgery;  Laterality: N/A;  L1 kyphoplasty  . KYPHOPLASTY N/A 04/01/2014   Procedure: KYPHOPLASTY;  Surgeon: Ashok Pall, MD;  Location: [place] [facility]UR[facility] ORS;  Service: Neurosurgery;  Laterality: N/A;  Lumbar Four Kyphoplasty  . KYPHOPLASTY N/A 08/07/2015   Procedure: KYPHOPLASTY LUMBAR THREE;  Surgeon: Ashok Pall, MD;  Location: [place] [facility];  Service: Neurosurgery;  Laterality: N/A;  . KYPHOPLASTY N/A 12/09/2015   Procedure: Lumbar five, KYPHOPLASTY;  Surgeon: Ashok Pall, MD;  Location: [place] NEURO ORS;  Service: Neurosurgery;  Laterality: N/A;  L5 kyphoplasty  . KYPHOPLASTY N/A 01/13/2016   Procedure: Thoracic seven KYPHOPLASTY;  Surgeon: Ashok Pall, MD;  Location: [place] NEURO ORS;  Service: Neurosurgery;  Laterality: N/A;  . KYPHOPLASTY N/A 01/22/2016   Procedure: THORACIC EIGHT KYPHOPLASTY;  Surgeon: Ashok Pall, MD;  Location: [place] NEURO ORS;  Service: Neurosurgery;  Laterality: N/A;  T8 KYPHOPLASTY  . L2 kyphoplasty using Kyphon system.  03/17/2009  . PERCUTANEOUS PINNING WRIST FRACTURE Bilateral 1995   bil  . Replacement of ascending aorta and hemiarch and resuspension of the aortic valve with circulatory cardiopulmonary bypass and circulatory arrest  06/25/2010   Dr Servando Snare  . Revision of left total hip replacement using some allograft  11/23/2010  . TOE OSTEOTOMY  Right 2nd   . TONSILLECTOMY    . TUBAL LIGATION  1966    Family History  Problem Relation Age of Onset  . Heart failure Mother   . COPD Mother     never smoker  . Stroke Father   . Hypertension Brother   . Hypertension Brother   . Hypertension Sister   . Hypertension Sister   . Stroke Sister   . Stomach cancer      nephew  . Breast cancer      neice    Social History:  reports that she has never smoked. She has never used smokeless tobacco. She reports that she does not drink alcohol or use drugs.  Allergies: No Known  Allergies  Medications: I have reviewed the patient's current medications.  Results for orders placed or performed during the hospital encounter of 08/15/16 (from the past 48 hour(s))  Basic metabolic panel     Status: Abnormal   Collection Time: 08/15/16  7:44 PM  Result Value Ref Range   Sodium 140 135 - 145 mmol/L   Potassium 3.7 3.5 - 5.1 mmol/L   Chloride 111 101 - 111 mmol/L   CO2 21 (L) 22 - 32 mmol/L   Glucose, Bld 190 (H) 65 - 99 mg/dL   BUN 24 (H) 6 - 20 mg/dL   Creatinine, Ser 0.90 0.44 - 1.00 mg/dL   Calcium 8.7 (L) 8.9 - 10.3 mg/dL   GFR calc non Af Amer 55 (L) >60 mL/min   GFR calc Af Amer >60 >60 mL/min    Comment: (NOTE) The eGFR has been calculated using the CKD EPI equation. This calculation has not been validated in all clinical situations. eGFR's persistently <60 mL/min signify possible Chronic Kidney Disease.    Anion gap 8 5 - 15  CBC WITH DIFFERENTIAL     Status: Abnormal   Collection Time: 08/15/16  7:44 PM  Result Value Ref Range   WBC 12.6 (H) 4.0 - 10.5 K/uL   RBC 3.62 (L) 3.87 - 5.11 MIL/uL   Hemoglobin 11.0 (L) 12.0 - 15.0 g/dL   HCT 34.2 (L) 36.0 - 46.0 %   MCV 94.5 78.0 - 100.0 fL   MCH 30.4 26.0 - 34.0 pg   MCHC 32.2 30.0 - 36.0 g/dL   RDW 14.5 11.5 - 15.5 %   Platelets 113 (L) 150 - 400 K/uL    Comment: REPEATED TO VERIFY SPECIMEN CHECKED FOR CLOTS PLATELETS APPEAR DECREASED PLATELET COUNT CONFIRMED BY SMEAR    Neutrophils Relative % 93 %   Neutro Abs 11.7 (H) 1.7 - 7.7 K/uL   Lymphocytes Relative 5 %   Lymphs Abs 0.6 (L) 0.7 - 4.0 K/uL   Monocytes Relative 3 %   Monocytes Absolute 0.3 0.1 - 1.0 K/uL   Eosinophils Relative 0 %   Eosinophils Absolute 0.0 0.0 - 0.7 K/uL   Basophils Relative 0 %   Basophils Absolute 0.0 0.0 - 0.1 K/uL  Protime-INR     Status: None   Collection Time: 08/15/16  7:44 PM  Result Value Ref Range   Prothrombin Time 13.2 11.4 - 15.2 seconds   INR 1.00   Type and screen [place]      Status: None (Preliminary result)   Collection Time: 08/15/16  7:44 PM  Result Value Ref Range   ABO/RH(D) A POS    Antibody Screen POS    Sample Expiration 08/18/2016    Antibody Identification ANTI K    DAT,  IgG NEG    Unit Number [id number]    Blood Component Type RED CELLS,LR    Unit division 00    Status of Unit ALLOCATED    Donor AG Type NEGATIVE FOR KELL ANTIGEN    Transfusion Status OK TO TRANSFUSE    Crossmatch Result COMPATIBLE    Unit Number [id number]    Blood Component Type RED CELLS,LR    Unit division 00    Status of Unit ALLOCATED    Donor AG Type NEGATIVE FOR KELL ANTIGEN    Transfusion Status OK TO TRANSFUSE    Crossmatch Result COMPATIBLE   MRSA PCR Screening     Status: None   Collection Time: 08/16/16  1:37 AM  Result Value Ref Range   MRSA by PCR NEGATIVE NEGATIVE    Comment:        The GeneXpert MRSA Assay (FDA approved for NASAL specimens only), is one component of a comprehensive MRSA colonization surveillance program. It is not intended to diagnose MRSA infection nor to guide or monitor treatment for MRSA infections.   CBC     Status: Abnormal   Collection Time: 08/16/16  3:50 AM  Result Value Ref Range   WBC 8.3 4.0 - 10.5 K/uL   RBC 3.22 (L) 3.87 - 5.11 MIL/uL   Hemoglobin 9.9 (L) 12.0 - 15.0 g/dL   HCT 30.8 (L) 36.0 - 46.0 %   MCV 95.7 78.0 - 100.0 fL   MCH 30.7 26.0 - 34.0 pg   MCHC 32.1 30.0 - 36.0 g/dL   RDW 14.5 11.5 - 15.5 %   Platelets 96 (L) 150 - 400 K/uL    Comment: REPEATED TO VERIFY PLATELET COUNT CONFIRMED BY SMEAR   Basic metabolic panel     Status: Abnormal   Collection Time: 08/16/16  3:50 AM  Result Value Ref Range   Sodium 140 135 - 145 mmol/L   Potassium 4.5 3.5 - 5.1 mmol/L    Comment: DELTA CHECK NOTED   Chloride 108 101 - 111 mmol/L   CO2 25 22 - 32 mmol/L   Glucose, Bld 199 (H) 65 - 99 mg/dL   BUN 19 6 - 20 mg/dL   Creatinine, Ser 0.90 0.44 - 1.00 mg/dL   Calcium 8.4 (L) 8.9 - 10.3 mg/dL   GFR  calc non Af Amer 55 (L) >60 mL/min   GFR calc Af Amer >60 >60 mL/min    Comment: (NOTE) The eGFR has been calculated using the CKD EPI equation. This calculation has not been validated in all clinical situations. eGFR's persistently <60 mL/min signify possible Chronic Kidney Disease.    Anion gap 7 5 - 15    Dg Chest 1 View  Result Date: 08/15/2016 CLINICAL DATA:  Unwitnessed fall today. Was probably on the ground for several hours. EXAM: CHEST 1 VIEW COMPARISON:  08/04/2016 FINDINGS: Fractures of the left third fourth and fifth ribs, mildly displaced. No pneumothorax. No large effusion. Normal mediastinal contours. The lungs are clear. IMPRESSION: Left third through fifth rib fractures. Electronically Signed   By: Andreas Newport M.D.   On: 08/15/2016 21:07   Dg Pelvis 1-2 Views  Result Date: 08/15/2016 CLINICAL DATA:  [place] outside of while walking to or mL boxes afternoon. EXAM: PELVIS - 1-2 VIEW COMPARISON:  12/30/2015 FINDINGS: Chronic fracture deformities of the pubic rami bilaterally. There is superimposed acute fracture of the left femur just below the trochanters and superimposed acute fracture of the superior left pubic ramus. No dislocation. IMPRESSION: Chronic  pubic ramus fracture deformities with superimposed acute fractures of the left superior ramus and of the left subtrochanteric femur. Electronically Signed   By: Andreas Newport M.D.   On: 08/15/2016 21:14   Dg Elbow Complete Left  Result Date: 08/15/2016 CLINICAL DATA:  Left elbow injury and pain due to a trip and fall today. Initial encounter. EXAM: LEFT ELBOW - COMPLETE 3+ VIEW COMPARISON:  None. FINDINGS: There is no evidence of fracture, dislocation, or joint effusion. There is no evidence of arthropathy or other focal bone abnormality. Soft tissues are unremarkable. IMPRESSION: Negative exam. Electronically Signed   By: Inge Rise M.D.   On: 08/15/2016 21:05   Dg Wrist Complete Left  Result Date:  08/16/2016 CLINICAL DATA:  82 year old female status post reduction of left wrist fracture. EXAM: LEFT WRIST - COMPLETE 3+ VIEW COMPARISON:  Earlier radiograph dated 08/15/2016 FINDINGS: There has been interval reduction of the angulation and degree of overriding of the distal radial intra-articular and comminuted fracture. The distal ulna fracture appears similar to prior radiograph. Old ulnar-styloid fracture. There has been interval placement of a cast over the distal forearm and wrist. There is osteopenia with osteoarthritic changes of the carpal bones. IMPRESSION: Interval reduction of the degree of angulation of the distal radial fracture in cast placement. Distal ulna fracture appears similar to the prior radiograph. Overall there is reduction of a angulated deformity of the wrist since the prior radiograph. Electronically Signed   By: Anner Crete M.D.   On: 08/16/2016 00:16   Dg Wrist Complete Left  Result Date: 08/15/2016 CLINICAL DATA:  82 y/o  F; status post fall with wrist fracture. EXAM: LEFT HAND - COMPLETE 3+ VIEW; LEFT WRIST - COMPLETE 3+ VIEW COMPARISON:  None. FINDINGS: Left wrist: Intra-articular overriding distal radius acute fracture with comminution, 1 shaft's with volar displacement of the radial shaft relative to the epiphysis, and volar apex angulation. Distal ulnar diaphysis extra-articular mildly displaced and medial and volar apex angulated acute fracture. Chronic ulnar styloid fracture. No radiocarpal dislocation. Left hand: No acute fracture or dislocation of the hand identified. Extensive osteoarthrosis of interphalangeal joints with large periarticular osteophytes. Mild basal joint and moderate triscaphe osteoarthrosis. Bones are demineralized. Chronic fracture deformity with apex dorsal angulation of fifth metacarpal head. IMPRESSION: 1. Intraarticular, comminuted, displaced, angulated, and overriding distal radial acute fracture. 2. Nondisplaced mildly angulated distal  ulnar diaphysis acute fracture. 3. Chronic ulnar styloid fracture. 4. Chronic fifth metacarpal head fracture. 5. Osteoarthrosis of interphalangeal joints, the basal joint, and triscaphe complex. Electronically Signed   By: Kristine Garbe M.D.   On: 08/15/2016 21:14   Ct Head Wo Contrast  Result Date: 08/15/2016 CLINICAL DATA:  Trip and fall injury at 15:45 today. EXAM: CT HEAD WITHOUT CONTRAST TECHNIQUE: Contiguous axial images were obtained from the base of the skull through the vertex without intravenous contrast. COMPARISON:  None. FINDINGS: Brain: There is no intracranial hemorrhage, mass or evidence of acute infarction. There is moderate generalized atrophy. There is moderate chronic microvascular ischemic change. There is no significant extra-axial fluid collection. No acute intracranial findings are evident. Vascular: No hyperdense vessel or unexpected calcification. Skull: Normal. Negative for fracture or focal lesion. Sinuses/Orbits: No acute finding. Other: Small volume fat within the falx cerebri, an incidental finding. IMPRESSION: No acute intracranial findings. There is moderate generalized atrophy and chronic appearing white matter hypodensities which likely represent small vessel ischemic disease. Electronically Signed   By: Andreas Newport M.D.   On: 08/15/2016 21:25  Ct Pelvis Wo Contrast  Result Date: 08/16/2016 CLINICAL DATA:  82 year old female with fall and left hip pain. EXAM: CT PELVIS AND LEFT LOWER EXTREMITY WITHOUT CONTRAST TECHNIQUE: Multidetector CT imaging of the pelvis and left lower extremity was performed according to the standard protocol without intravenous contrast. Multiplanar CT image reconstructions were also generated. COMPARISON:  Left femur radiograph dated 08/15/2016 FINDINGS: Evaluation of the pelvic structures is limited in the absence of contrast as well as due to streak artifact caused by lumbar vertebroplasty. CT PELVIS FINDINGS Urinary Tract:  There is mild fullness of the visualized inferior portion of the left kidney. The right kidney is not visualized. The urinary bladder is mildly distended and grossly unremarkable. Bowel: Moderate stool noted within the colon. There is sigmoid diverticulosis without active inflammatory changes. No bowel dilatation noted within the pelvis. The appendix is not visualized with certainty. No inflammatory changes identified in the right lower quadrant. Vascular/Lymphatic: There is advanced aortoiliac atherosclerotic disease. Evaluation of the vessels is limited in the absence of intravenous contrast. Reproductive:  The uterus is grossly unremarkable. Other:  None Musculoskeletal: There is advanced osteopenia which limits evaluation for fracture. Multilevel old-appearing lower lumbar compression deformities and vertebroplasty changes. There is extrusion of small amount of the vertebroplasty cement along the posterior cortex of the L5. There is cortical discontinuity of the inferior aspect of the left sacral alum (coronal series 203, image 86) concerning for an age indeterminate fracture. There are old healed right pubic bone fractures. There is a minimally displaced comminuted acute fracture of the left superior pubic ramus as well as a nondisplaced fracture of the left inferior pubic ramus. The symphysis pubis appears intact. CT LEFT LOWER EXTREMITY FINDINGS Bones/Joint/Cartilage There is a total left hip arthroplasty. The arthroplasty components appear intact and diffuse anatomic alignment. There is a comminuted appearing nondisplaced sub trochanteric fracture with extension of the fracture in greater trochanter of the femur. The fracture involves the proximal femoral diaphysis. Evaluation of the fracture is very limited due to advanced osteopenia as well as streak artifact caused by orthopedic hardware. The femoral component of the arthroplasty appears to be maintained a satisfactory contact with the adjacent bone. No  definite lucency identified adjacent to the hardware. There is no fracture or dislocation of the left knee. Ligaments Suboptimally assessed by CT. Muscles and Tendons Probable mild soft tissue edema in the anterior compartment of the left femur involving the quadriceps musculature. No fluid collection or large hematoma. Soft tissues No large fluid collection or hematoma. IMPRESSION: Fractures of the left pubic rami as well as an age indeterminate fracture of the inferior aspect of the left sacral alum. Nondisplaced comminuted left femoral subtrochanteric fracture with extension into the greater trochanter. The orthopedic hardware appears intact and in anatomic alignment. No dislocation. Electronically Signed   By: Anner Crete M.D.   On: 08/16/2016 01:30   Ct Femur Left Wo Contrast  Result Date: 08/16/2016 CLINICAL DATA:  82 year old female with fall and left hip pain. EXAM: CT PELVIS AND LEFT LOWER EXTREMITY WITHOUT CONTRAST TECHNIQUE: Multidetector CT imaging of the pelvis and left lower extremity was performed according to the standard protocol without intravenous contrast. Multiplanar CT image reconstructions were also generated. COMPARISON:  Left femur radiograph dated 08/15/2016 FINDINGS: Evaluation of the pelvic structures is limited in the absence of contrast as well as due to streak artifact caused by lumbar vertebroplasty. CT PELVIS FINDINGS Urinary Tract: There is mild fullness of the visualized inferior portion of the left kidney. The  right kidney is not visualized. The urinary bladder is mildly distended and grossly unremarkable. Bowel: Moderate stool noted within the colon. There is sigmoid diverticulosis without active inflammatory changes. No bowel dilatation noted within the pelvis. The appendix is not visualized with certainty. No inflammatory changes identified in the right lower quadrant. Vascular/Lymphatic: There is advanced aortoiliac atherosclerotic disease. Evaluation of the vessels  is limited in the absence of intravenous contrast. Reproductive:  The uterus is grossly unremarkable. Other:  None Musculoskeletal: There is advanced osteopenia which limits evaluation for fracture. Multilevel old-appearing lower lumbar compression deformities and vertebroplasty changes. There is extrusion of small amount of the vertebroplasty cement along the posterior cortex of the L5. There is cortical discontinuity of the inferior aspect of the left sacral alum (coronal series 203, image 86) concerning for an age indeterminate fracture. There are old healed right pubic bone fractures. There is a minimally displaced comminuted acute fracture of the left superior pubic ramus as well as a nondisplaced fracture of the left inferior pubic ramus. The symphysis pubis appears intact. CT LEFT LOWER EXTREMITY FINDINGS Bones/Joint/Cartilage There is a total left hip arthroplasty. The arthroplasty components appear intact and diffuse anatomic alignment. There is a comminuted appearing nondisplaced sub trochanteric fracture with extension of the fracture in greater trochanter of the femur. The fracture involves the proximal femoral diaphysis. Evaluation of the fracture is very limited due to advanced osteopenia as well as streak artifact caused by orthopedic hardware. The femoral component of the arthroplasty appears to be maintained a satisfactory contact with the adjacent bone. No definite lucency identified adjacent to the hardware. There is no fracture or dislocation of the left knee. Ligaments Suboptimally assessed by CT. Muscles and Tendons Probable mild soft tissue edema in the anterior compartment of the left femur involving the quadriceps musculature. No fluid collection or large hematoma. Soft tissues No large fluid collection or hematoma. IMPRESSION: Fractures of the left pubic rami as well as an age indeterminate fracture of the inferior aspect of the left sacral alum. Nondisplaced comminuted left femoral  subtrochanteric fracture with extension into the greater trochanter. The orthopedic hardware appears intact and in anatomic alignment. No dislocation. Electronically Signed   By: Anner Crete M.D.   On: 08/16/2016 01:30   Dg Hand Complete Left  Result Date: 08/15/2016 CLINICAL DATA:  82 y/o  F; status post fall with wrist fracture. EXAM: LEFT HAND - COMPLETE 3+ VIEW; LEFT WRIST - COMPLETE 3+ VIEW COMPARISON:  None. FINDINGS: Left wrist: Intra-articular overriding distal radius acute fracture with comminution, 1 shaft's with volar displacement of the radial shaft relative to the epiphysis, and volar apex angulation. Distal ulnar diaphysis extra-articular mildly displaced and medial and volar apex angulated acute fracture. Chronic ulnar styloid fracture. No radiocarpal dislocation. Left hand: No acute fracture or dislocation of the hand identified. Extensive osteoarthrosis of interphalangeal joints with large periarticular osteophytes. Mild basal joint and moderate triscaphe osteoarthrosis. Bones are demineralized. Chronic fracture deformity with apex dorsal angulation of fifth metacarpal head. IMPRESSION: 1. Intraarticular, comminuted, displaced, angulated, and overriding distal radial acute fracture. 2. Nondisplaced mildly angulated distal ulnar diaphysis acute fracture. 3. Chronic ulnar styloid fracture. 4. Chronic fifth metacarpal head fracture. 5. Osteoarthrosis of interphalangeal joints, the basal joint, and triscaphe complex. Electronically Signed   By: Kristine Garbe M.D.   On: 08/15/2016 21:14   Dg Femur Min 2 Views Left  Result Date: 08/15/2016 CLINICAL DATA:  Left hip pain due to a trip and fall today. EXAM: LEFT FEMUR 2  VIEWS COMPARISON:  None. FINDINGS: Total left hip arthroplasty is in place. The patient has an acute fracture of the subtrochanteric left femur. The fracture may extend into the greater trochanter. There is also a left superior pubic ramus fracture. The left hip is  located. IMPRESSION: Acute subtrochanteric left hip fracture with a total hip arthroplasty in place. The fracture may extend to the greater trochanter. Acute left superior pubic ramus fracture. Electronically Signed   By: Inge Rise M.D.   On: 08/15/2016 21:09    Review of Systems  Constitutional: Negative.   Respiratory: Negative.   Cardiovascular: Negative.   Gastrointestinal: Negative.   Genitourinary: Negative.    Blood pressure (!) 124/51, pulse 80, temperature 98.4 F (36.9 C), temperature source Oral, resp. rate 16, height '5\' 6"'  (1.676 m), weight 53.5 kg (118 lb), SpO2 94 %. Physical Exam Left upper extremity is in a splint. She has massive swelling in her fingers but is sensate and can move these fingers. There is no evidence of compartment syndrome. Her splint is stable. Her elbow is not particularly tender and her elbow x-rays are negative. The x-rays of her wrist are very highly comminuted fracture.  She has a normal right upper extremity. There is no evidence of infection or dystrophy.  Lower extremity examination shows she is neurovascularly intact without signs of DVT.  Chest has equal expansion. Abdomen is nontender Assessment/Plan: Comminuted complex left distal radius fracture and a highly functional 82 year old female.  I had a long talk with the family about all issues. At present juncture this is a very challenging distal radius and normal fracture. I discussed with the family that in a highly functioning person our best go would be to give her a stable construct so that she goes on to heal and not develop progressive angulatory collapse.  With this in mind the patient and family desire to proceed with surgical intervention.  I will hope to perform her surgical endeavors tomorrow pending schedules  We are planning surgery for your upper extremity. The risk and benefits of surgery to include risk of bleeding, infection, anesthesia,  damage to normal structures and  failure of the surgery to accomplish its intended goals of relieving symptoms and restoring function have been discussed in detail. With this in mind we plan to proceed. I have specifically discussed with the patient the pre-and postoperative regime and the dos and don'ts and risk and benefits in great detail. Risk and benefits of surgery also include risk of dystrophy(CRPS), chronic nerve pain, failure of the healing process to go onto completion and other inherent risks of surgery The relavent the pathophysiology of the disease/injury process, as well as the alternatives for treatment and postoperative course of action has been discussed in great detail with the patient who desires to proceed.  We will do everything in our power to help you (the patient) restore function to the upper extremity. It is a pleasure to see this patient today.   Zaylei Mullane III,Winta Barcelo M 08/16/2016, 7:10 PM

## 2016-08-16 NOTE — Care Management Note (Signed)
Case Management Note  Patient Details  Name: SHAKENYA STONEBERG MRN: [id number] Date of Birth: February 27, 1926  Subjective/Objective:  Pt admitted on 08/15/16 s/p ground level fall with Lt distal radius and ulna fx, Lt pubic rami fx, Lt subtrochanteric femur fx, and Lt 3-5 rib fx.  PTA, pt independent, and living with daughter and family.                    Action/Plan: Met with pt to discuss dc planning.  Pt's daughter is a Marine scientist at [employer].  Will follow for discharge planning as pt progresses.  Will need PT/OT evaluations when able to tolerate therapies.    Expected Discharge Date:              Expected Discharge Plan:  [place]  In-House Referral:     Discharge planning Services  CM Consult  Post Acute Care Choice:    Choice offered to:     DME Arranged:    DME Agency:     HH Arranged:    [place] Agency:     Status of Service:  In process, will continue to follow  If discussed at Long Length of Stay Meetings, dates discussed:    Additional Comments:  Reinaldo Raddle, RN, BSN  Trauma/Neuro ICU Case Manager [phone]

## 2016-08-16 NOTE — Progress Notes (Signed)
  Subjective: Some L rib pain and L wrist pain  Objective: Vital signs in last 24 hours: Temp:  [97.6 F (36.4 C)-98 F (36.7 C)] 97.7 F (36.5 C) (12/19 0400) Pulse Rate:  [80-102] 85 (12/19 0700) Resp:  [0-27] 13 (12/19 0700) BP: (119-165)/(53-77) 131/63 (12/19 0700) SpO2:  [93 %-100 %] 100 % (12/19 0700) Weight:  [53.5 kg (118 lb)] 53.5 kg (118 lb) (12/18 1859) Last BM Date: 08/15/16  Intake/Output from previous day: 12/18 0701 - 12/19 0700 In: 300 [I.V.:300] Out: 150 [Urine:150] Intake/Output this shift: No intake/output data recorded.  General appearance: alert and cooperative Resp: clear to auscultation bilaterally Cardio: regular rate and rhythm GI: soft, NT, ND Extremities: sling L wrist, fingers swollen but warm, L hip tender Neurologic: Mental status: Alert, oriented, thought content appropriate  Lab Results: CBC   Recent Labs  08/15/16 1944 08/16/16 0350  WBC 12.6* 8.3  HGB 11.0* 9.9*  HCT 34.2* 30.8*  PLT 113* 96*   BMET  Recent Labs  08/15/16 1944 08/16/16 0350  NA 140 140  K 3.7 4.5  CL 111 108  CO2 21* 25  GLUCOSE 190* 199*  BUN 24* 19  CREATININE 0.90 0.90  CALCIUM 8.7* 8.4*    Anti-infectives: Anti-infectives    None      Assessment/Plan: Fall in driveway L rib FX 3-5 - pulm toilet, schedule Ultram, she did 1000cc on IS, CXR in AM L distal radius and ulna FX - per family request Dr. Amedeo Plenty to see, Dr. Rolena Infante to contact him L periprosthetic subtroch femur FX - Per Dr. Rolena Infante, he is reviewing films with Dr. Alvan Dame  L sup ramus FX - per Dr. Rolena Infante, non-op, no WB restriction for this FEN - NPO pending ortho decisions, pain management Hypothyroidism - home synthroid VTE - PAS DIspo - ICU, therapies P ortho plan   LOS: 0 days    Georganna Skeans, MD, MPH, FACS Trauma: [phone] General Surgery: [phone]  12/19/2017Patient ID: Yvonne [name], female   DOB: Dec 19, 1925, 82 y.o.   MRN: [id number]

## 2016-08-16 NOTE — Progress Notes (Signed)
Spoke with Dr. Alvan Dame No need for operative fixation of hip fracture TDWB with assistive device Will follow up with Dr Alvan Dame in 4 weeks for repeatr xrays and re-evaluation Will continue to monitor patient while in house

## 2016-08-16 NOTE — Progress Notes (Signed)
Patient ID: Yvonne [name], female   DOB: 1925/12/12, 82 y.o.   MRN: [id number] I spoke with her son at the bedside and updated him on the plan of care. Georganna Skeans, MD, MPH, FACS Trauma: [phone] General Surgery: [phone]

## 2016-08-16 NOTE — ED Provider Notes (Signed)
  Physical Exam  BP 142/66   Pulse 96   Temp 97.6 F (36.4 C) (Oral)   Resp 22   Ht 5\' 6"  (1.676 m)   Wt 53.5 kg   SpO2 94%   BMI 19.05 kg/m   Physical Exam  ED Course  .Splint Application Date/Time: [id number] 12:20 AM Performed by: Theodosia Quay Authorized by: Davonna Belling   Consent:    Consent obtained:  Verbal   Consent given by:  Patient Pre-procedure details:    Sensation:  Normal Procedure details:    Laterality:  Left   Location:  Wrist   Wrist:  L wrist   Splint type:  Sugar tong Post-procedure details:    Pain:  Improved   Sensation:  Normal   Patient tolerance of procedure:  Tolerated well, no immediate complications Reduction of fracture Date/Time: 08/16/2016 12:21 AM Performed by: Theodosia Quay Authorized by: Davonna Belling  Consent: Verbal consent obtained. Risks and benefits: risks, benefits and alternatives were discussed Consent given by: patient Patient understanding: patient states understanding of the procedure being performed Patient identity confirmed: verbally with patient Local anesthesia used: yes Anesthesia: hematoma block  Anesthesia: Local anesthesia used: yes Local Anesthetic: lidocaine 1% without epinephrine Anesthetic total: 8 mL  Sedation: Patient sedated: no Patient tolerance: Patient tolerated the procedure well with no immediate complications Comments: Successful reduction of dorsal displacement of left ulnar/radius fracture. Splint application as noted.             Theodosia Quay, MD 08/16/16 0023    Davonna Belling, MD 08/16/16 [phone]

## 2016-08-17 ENCOUNTER — Inpatient Hospital Stay (HOSPITAL_COMMUNITY): Payer: Medicare Other | Admitting: Certified Registered"

## 2016-08-17 ENCOUNTER — Encounter (HOSPITAL_COMMUNITY): Admission: EM | Disposition: A | Discharge status: Skilled Nursing Facility | Payer: Self-pay | Source: Home / Self Care

## 2016-08-17 ENCOUNTER — Encounter (HOSPITAL_COMMUNITY): Payer: Self-pay | Admitting: Surgery

## 2016-08-17 ENCOUNTER — Inpatient Hospital Stay (HOSPITAL_COMMUNITY): Payer: Medicare Other

## 2016-08-17 DIAGNOSIS — Z8679 Personal history of other diseases of the circulatory system: Secondary | ICD-10-CM

## 2016-08-17 DIAGNOSIS — I472 Ventricular tachycardia: Secondary | ICD-10-CM

## 2016-08-17 DIAGNOSIS — I4729 Other ventricular tachycardia: Secondary | ICD-10-CM

## 2016-08-17 HISTORY — PX: ORIF WRIST FRACTURE: SHX2133

## 2016-08-17 LAB — BASIC METABOLIC PANEL
Anion gap: 5 (ref 5–15)
BUN: 10 mg/dL (ref 6–20)
CALCIUM: 8.3 mg/dL — AB (ref 8.9–10.3)
CO2: 26 mmol/L (ref 22–32)
CREATININE: 0.83 mg/dL (ref 0.44–1.00)
Chloride: 107 mmol/L (ref 101–111)
GFR calc Af Amer: >60 mL/min (ref >60)
GLUCOSE: 151 mg/dL — AB (ref 65–99)
Potassium: 4 mmol/L (ref 3.5–5.1)
Sodium: 138 mmol/L (ref 135–145)

## 2016-08-17 LAB — CBC
HCT: 26.6 % — ABNORMAL LOW (ref 36.0–46.0)
Hemoglobin: 8.5 g/dL — ABNORMAL LOW (ref 12.0–15.0)
MCH: 31 pg (ref 26.0–34.0)
MCHC: 32 g/dL (ref 30.0–36.0)
MCV: 97.1 fL (ref 78.0–100.0)
PLATELETS: 73 10*3/uL — AB (ref 150–400)
RBC: 2.74 MIL/uL — ABNORMAL LOW (ref 3.87–5.11)
RDW: 14.7 % (ref 11.5–15.5)
WBC: 6.8 10*3/uL (ref 4.0–10.5)

## 2016-08-17 SURGERY — OPEN REDUCTION INTERNAL FIXATION (ORIF) WRIST FRACTURE
Anesthesia: Monitor Anesthesia Care | Site: Hand | Laterality: Left

## 2016-08-17 MED ORDER — CEFAZOLIN IN D5W 1 GM/50ML IV SOLN
1.0000 g | Freq: Three times a day (TID) | INTRAVENOUS | Status: DC
  Administered 2016-08-17 – 2016-08-20 (×8): 1 g via INTRAVENOUS
  Filled 2016-08-17 (×9): qty 50

## 2016-08-17 MED ORDER — BUPIVACAINE HCL (PF) 0.25 % IJ SOLN
INTRAMUSCULAR | Status: DC | PRN
  Administered 2016-08-17: 30 mL

## 2016-08-17 MED ORDER — LIDOCAINE 2% (20 MG/ML) 5 ML SYRINGE
INTRAMUSCULAR | Status: AC
  Filled 2016-08-17: qty 5

## 2016-08-17 MED ORDER — PHENYLEPHRINE HCL 10 MG/ML IJ SOLN
INTRAMUSCULAR | Status: DC | PRN
  Administered 2016-08-17: 80 ug via INTRAVENOUS

## 2016-08-17 MED ORDER — CEFAZOLIN SODIUM 1 G IJ SOLR
INTRAMUSCULAR | Status: DC | PRN
  Administered 2016-08-17: 2 g via INTRAMUSCULAR

## 2016-08-17 MED ORDER — PROPOFOL 10 MG/ML IV BOLUS
INTRAVENOUS | Status: AC
  Filled 2016-08-17: qty 20

## 2016-08-17 MED ORDER — DEXTROSE-NACL 5-0.9 % IV SOLN
INTRAVENOUS | Status: DC
  Administered 2016-08-17: 50 mL/h via INTRAVENOUS

## 2016-08-17 MED ORDER — 0.9 % SODIUM CHLORIDE (POUR BTL) OPTIME
TOPICAL | Status: DC | PRN
  Administered 2016-08-17: 1000 mL

## 2016-08-17 MED ORDER — ROPIVACAINE HCL 5 MG/ML IJ SOLN
INTRAMUSCULAR | Status: DC | PRN
  Administered 2016-08-17: 25 mL via PERINEURAL

## 2016-08-17 MED ORDER — FENTANYL CITRATE (PF) 100 MCG/2ML IJ SOLN
INTRAMUSCULAR | Status: AC
  Filled 2016-08-17: qty 2

## 2016-08-17 MED ORDER — ONDANSETRON HCL 4 MG/2ML IJ SOLN
INTRAMUSCULAR | Status: AC
  Filled 2016-08-17: qty 2

## 2016-08-17 MED ORDER — BACITRACIN-NEOMYCIN-POLYMYXIN 400-5-5000 EX OINT
TOPICAL_OINTMENT | CUTANEOUS | Status: AC
  Filled 2016-08-17: qty 1

## 2016-08-17 MED ORDER — BUPIVACAINE HCL (PF) 0.25 % IJ SOLN
INTRAMUSCULAR | Status: AC
  Filled 2016-08-17: qty 30

## 2016-08-17 MED ORDER — FENTANYL CITRATE (PF) 100 MCG/2ML IJ SOLN
INTRAMUSCULAR | Status: DC | PRN
  Administered 2016-08-17 (×2): 25 ug via INTRAVENOUS
  Administered 2016-08-17: 50 ug via INTRAVENOUS

## 2016-08-17 MED ORDER — LACTATED RINGERS IV SOLN
INTRAVENOUS | Status: DC | PRN
  Administered 2016-08-17: 17:00:00 via INTRAVENOUS

## 2016-08-17 SURGICAL SUPPLY — 77 items
BANDAGE ACE 4X5 VEL STRL LF (GAUZE/BANDAGES/DRESSINGS) ×3 IMPLANT
BANDAGE ELASTIC 3 VELCRO ST LF (GAUZE/BANDAGES/DRESSINGS) ×5 IMPLANT
BANDAGE ELASTIC 4 VELCRO ST LF (GAUZE/BANDAGES/DRESSINGS) ×2 IMPLANT
BIT DRILL 2.2 SS TIBIAL (BIT) ×2 IMPLANT
BLADE SURG ROTATE 9660 (MISCELLANEOUS) IMPLANT
BNDG CMPR 9X4 STRL LF SNTH (GAUZE/BANDAGES/DRESSINGS) ×1
BNDG ESMARK 4X9 LF (GAUZE/BANDAGES/DRESSINGS) ×3 IMPLANT
BNDG GAUZE ELAST 4 BULKY (GAUZE/BANDAGES/DRESSINGS) ×7 IMPLANT
CANISTER SUCTION 2500CC (MISCELLANEOUS) ×3 IMPLANT
CORDS BIPOLAR (ELECTRODE) ×3 IMPLANT
COVER SURGICAL LIGHT HANDLE (MISCELLANEOUS) ×3 IMPLANT
CUFF TOURNIQUET SINGLE 18IN (TOURNIQUET CUFF) ×3 IMPLANT
CUFF TOURNIQUET SINGLE 24IN (TOURNIQUET CUFF) IMPLANT
DECANTER SPIKE VIAL GLASS SM (MISCELLANEOUS) ×2 IMPLANT
DRAIN TLS ROUND 10FR (DRAIN) IMPLANT
DRAPE OEC MINIVIEW 54X84 (DRAPES) ×2 IMPLANT
DRAPE SURG 17X23 STRL (DRAPES) ×3 IMPLANT
DRSG ADAPTIC 3X8 NADH LF (GAUZE/BANDAGES/DRESSINGS) ×6 IMPLANT
GAUZE SPONGE 4X4 12PLY STRL (GAUZE/BANDAGES/DRESSINGS) ×3 IMPLANT
GAUZE XEROFORM 1X8 LF (GAUZE/BANDAGES/DRESSINGS) ×3 IMPLANT
GAUZE XEROFORM 5X9 LF (GAUZE/BANDAGES/DRESSINGS) ×6 IMPLANT
GLOVE BIO SURGEON STRL SZ 6.5 (GLOVE) ×1 IMPLANT
GLOVE BIO SURGEONS STRL SZ 6.5 (GLOVE) ×1
GLOVE BIOGEL M 8.0 STRL (GLOVE) ×3 IMPLANT
GLOVE BIOGEL PI IND STRL 6.5 (GLOVE) IMPLANT
GLOVE BIOGEL PI INDICATOR 6.5 (GLOVE) ×2
GLOVE SS BIOGEL STRL SZ 8 (GLOVE) ×1 IMPLANT
GLOVE SUPERSENSE BIOGEL SZ 8 (GLOVE) ×2
GOWN STRL REUS W/ TWL LRG LVL3 (GOWN DISPOSABLE) ×1 IMPLANT
GOWN STRL REUS W/ TWL XL LVL3 (GOWN DISPOSABLE) ×2 IMPLANT
GOWN STRL REUS W/TWL LRG LVL3 (GOWN DISPOSABLE) ×3
GOWN STRL REUS W/TWL XL LVL3 (GOWN DISPOSABLE) ×6
KIT BASIN OR (CUSTOM PROCEDURE TRAY) ×3 IMPLANT
KIT ROOM TURNOVER OR (KITS) ×3 IMPLANT
LOOP VESSEL MAXI BLUE (MISCELLANEOUS) IMPLANT
MANIFOLD NEPTUNE II (INSTRUMENTS) ×3 IMPLANT
NEEDLE 22X1 1/2 (OR ONLY) (NEEDLE) IMPLANT
NS IRRIG 1000ML POUR BTL (IV SOLUTION) ×3 IMPLANT
PACK ORTHO EXTREMITY (CUSTOM PROCEDURE TRAY) ×3 IMPLANT
PAD ARMBOARD 7.5X6 YLW CONV (MISCELLANEOUS) ×6 IMPLANT
PAD CAST 3X4 CTTN HI CHSV (CAST SUPPLIES) ×1 IMPLANT
PAD CAST 4YDX4 CTTN HI CHSV (CAST SUPPLIES) ×1 IMPLANT
PADDING CAST COTTON 3X4 STRL (CAST SUPPLIES) ×3
PADDING CAST COTTON 4X4 STRL (CAST SUPPLIES) ×6
PEG LOCKING SMOOTH 2.2X18 (Peg) ×4 IMPLANT
PEG LOCKING SMOOTH 2.2X20 (Screw) ×2 IMPLANT
PEG LOCKING SMOOTH 2.2X22 (Screw) ×4 IMPLANT
PLATE STD DVR LEFT (Plate) ×3 IMPLANT
PLATE STD DVR LT 24X55 (Plate) IMPLANT
PUTTY DBM STAGRAFT PLUS 5CC (Putty) ×2 IMPLANT
SCREW 2.7X14MM (Screw) ×2 IMPLANT
SCREW BN 14X2.7XNONLOCK 3 LD (Screw) IMPLANT
SCREW LOCK 12X2.7X 3 LD (Screw) IMPLANT
SCREW LOCK 14X2.7X 3 LD TPR (Screw) IMPLANT
SCREW LOCKING 2.7X12MM (Screw) ×3 IMPLANT
SCREW LOCKING 2.7X13MM (Screw) ×2 IMPLANT
SCREW LOCKING 2.7X14 (Screw) ×6 IMPLANT
SCREW MULTI DIRECTIONAL 2.7X18 (Screw) ×4 IMPLANT
SCREW MULTI DIRECTIONAL 2.7X20 (Screw) ×2 IMPLANT
SCREW NLOCK 2.7X14 (Screw) ×1 IMPLANT
SPLINT FIBERGLASS 4X30 (CAST SUPPLIES) ×4 IMPLANT
SPONGE GAUZE 4X4 12PLY STER LF (GAUZE/BANDAGES/DRESSINGS) ×2 IMPLANT
SPONGE LAP 4X18 X RAY DECT (DISPOSABLE) IMPLANT
SUT CHROMIC 5 0 P 3 (SUTURE) ×2 IMPLANT
SUT MNCRL AB 4-0 PS2 18 (SUTURE) ×3 IMPLANT
SUT PROLENE 3 0 PS 1 (SUTURE) ×2 IMPLANT
SUT PROLENE 3 0 PS 2 (SUTURE) IMPLANT
SUT PROLENE 4 0 P 3 18 (SUTURE) ×4 IMPLANT
SUT VIC AB 3-0 FS2 27 (SUTURE) ×2 IMPLANT
SYR CONTROL 10ML LL (SYRINGE) IMPLANT
SYSTEM CHEST DRAIN TLS 7FR (DRAIN) IMPLANT
TOWEL OR 17X24 6PK STRL BLUE (TOWEL DISPOSABLE) ×3 IMPLANT
TOWEL OR 17X26 10 PK STRL BLUE (TOWEL DISPOSABLE) ×3 IMPLANT
TUBE CONNECTING 12'X1/4 (SUCTIONS) ×1
TUBE CONNECTING 12X1/4 (SUCTIONS) ×2 IMPLANT
TUBE EVACUATION TLS (MISCELLANEOUS) ×3 IMPLANT
WATER STERILE IRR 1000ML POUR (IV SOLUTION) ×3 IMPLANT

## 2016-08-17 NOTE — Anesthesia Procedure Notes (Addendum)
Anesthesia Regional Block:  Supraclavicular block  Pre-Anesthetic Checklist: ,, timeout performed, Correct Patient, Correct Site, Correct Laterality, Correct Procedure, Correct Position, site marked, Risks and benefits discussed, Surgical consent,  Pre-op evaluation,  Post-op pain management  Laterality: Left and Upper  Prep: chloraprep, Dura Prep       Needles:   Needle Type: Stimulator Needle - 40     Needle Length: 4cm 4 cm Needle Gauge: 21 and 21 G  Needle insertion depth: 4 cm   Additional Needles:  Procedures: ultrasound guided (picture in chart) Supraclavicular block Narrative:  Start time: 08/17/2016 5:05 PM End time: 08/17/2016 5:25 PM Injection made incrementally with aspirations every 5 mL.  Performed by: Personally  Anesthesiologist: Jd Mccaster  Additional Notes: Tolerated well

## 2016-08-17 NOTE — Op Note (Signed)
Surgery dictation#[id number] SP ORIF left radius and ulna with allograft and DVR volar rim plate Aiyonna Lucado MD

## 2016-08-17 NOTE — Transfer of Care (Signed)
Immediate Anesthesia Transfer of Care Note  Patient: Yvonne [name]  Procedure(s) Performed: Procedure(s) with comments: OPEN REDUCTION INTERNAL FIXATION (ORIF) WRIST FRACTURE (Left) - block done as well   Patient Location: PACU  Anesthesia Type:Regional  Level of Consciousness: sedated, patient cooperative and responds to stimulation  Airway & Oxygen Therapy: Patient Spontanous Breathing and Patient connected to nasal cannula oxygen  Post-op Assessment: Report given to RN, Post -op Vital signs reviewed and stable and Patient moving all extremities X 4  Post vital signs: Reviewed and stable  Last Vitals:  Vitals:   08/17/16 1600 08/17/16 1939  BP: (!) 111/48   Pulse: 72   Resp: (!) 21   Temp:  (P) 36.4 C    Last Pain:  Vitals:   08/17/16 1520  TempSrc:   PainSc: 0-No pain      Patients Stated Pain Goal: 1 ([id number])  Complications: No apparent anesthesia complications

## 2016-08-17 NOTE — Progress Notes (Signed)
Trauma Service Note  Subjective: Patient was asleep on arrival.  Awakens easily.  Minimal pain at rest.  For surgery late this afternoon.  Objective: Vital signs in last 24 hours: Temp:  [98.1 F (36.7 C)-98.7 F (37.1 C)] 98.7 F (37.1 C) (12/20 0400) Pulse Rate:  [72-94] 74 (12/20 0900) Resp:  [15-22] 16 (12/20 0900) BP: (116-139)/(36-64) 118/45 (12/20 0900) SpO2:  [85 %-100 %] 91 % (12/20 0900) Last BM Date: 08/15/16  Intake/Output from previous day: 12/19 0701 - 12/20 0700 In: 2205 [P.O.:330; I.V.:1875] Out: 700 [Urine:700] Intake/Output this shift: Total I/O In: 75 [I.V.:75] Out: 100 [Urine:100]  General: No acute distress.  Lungs: Clear to auscultation.  No chest wall crepitance  Abd: Soft, good bowel sounds.  Extremities: Left forearm in sling and splint.  Minimal pain currently  Neuro: Intact  Lab Results: CBC   Recent Labs  08/16/16 0350 08/17/16 0406  WBC 8.3 6.8  HGB 9.9* 8.5*  HCT 30.8* 26.6*  PLT 96* 73*   BMET  Recent Labs  08/16/16 0350 08/17/16 0406  NA 140 138  K 4.5 4.0  CL 108 107  CO2 25 26  GLUCOSE 199* 151*  BUN 19 10  CREATININE 0.90 0.83  CALCIUM 8.4* 8.3*   PT/INR  Recent Labs  08/15/16 1944  LABPROT 13.2  INR 1.00   ABG No results for input(s): PHART, HCO3 in the last 72 hours.  Invalid input(s): PCO2, PO2  Studies/Results: Dg Chest 1 View  Result Date: 08/15/2016 CLINICAL DATA:  Unwitnessed fall today. Was probably on the ground for several hours. EXAM: CHEST 1 VIEW COMPARISON:  08/04/2016 FINDINGS: Fractures of the left third fourth and fifth ribs, mildly displaced. No pneumothorax. No large effusion. Normal mediastinal contours. The lungs are clear. IMPRESSION: Left third through fifth rib fractures. Electronically Signed   By: Andreas Newport M.D.   On: 08/15/2016 21:07   Dg Pelvis 1-2 Views  Result Date: 08/15/2016 CLINICAL DATA:  [place] outside of while walking to or mL boxes afternoon. EXAM: PELVIS -  1-2 VIEW COMPARISON:  12/30/2015 FINDINGS: Chronic fracture deformities of the pubic rami bilaterally. There is superimposed acute fracture of the left femur just below the trochanters and superimposed acute fracture of the superior left pubic ramus. No dislocation. IMPRESSION: Chronic pubic ramus fracture deformities with superimposed acute fractures of the left superior ramus and of the left subtrochanteric femur. Electronically Signed   By: Andreas Newport M.D.   On: 08/15/2016 21:14   Dg Elbow Complete Left  Result Date: 08/15/2016 CLINICAL DATA:  Left elbow injury and pain due to a trip and fall today. Initial encounter. EXAM: LEFT ELBOW - COMPLETE 3+ VIEW COMPARISON:  None. FINDINGS: There is no evidence of fracture, dislocation, or joint effusion. There is no evidence of arthropathy or other focal bone abnormality. Soft tissues are unremarkable. IMPRESSION: Negative exam. Electronically Signed   By: Inge Rise M.D.   On: 08/15/2016 21:05   Dg Wrist Complete Left  Result Date: 08/16/2016 CLINICAL DATA:  82 year old female status post reduction of left wrist fracture. EXAM: LEFT WRIST - COMPLETE 3+ VIEW COMPARISON:  Earlier radiograph dated 08/15/2016 FINDINGS: There has been interval reduction of the angulation and degree of overriding of the distal radial intra-articular and comminuted fracture. The distal ulna fracture appears similar to prior radiograph. Old ulnar-styloid fracture. There has been interval placement of a cast over the distal forearm and wrist. There is osteopenia with osteoarthritic changes of the carpal bones. IMPRESSION: Interval reduction  of the degree of angulation of the distal radial fracture in cast placement. Distal ulna fracture appears similar to the prior radiograph. Overall there is reduction of a angulated deformity of the wrist since the prior radiograph. Electronically Signed   By: Anner Crete M.D.   On: 08/16/2016 00:16   Dg Wrist Complete  Left  Result Date: 08/15/2016 CLINICAL DATA:  82 y/o  F; status post fall with wrist fracture. EXAM: LEFT HAND - COMPLETE 3+ VIEW; LEFT WRIST - COMPLETE 3+ VIEW COMPARISON:  None. FINDINGS: Left wrist: Intra-articular overriding distal radius acute fracture with comminution, 1 shaft's with volar displacement of the radial shaft relative to the epiphysis, and volar apex angulation. Distal ulnar diaphysis extra-articular mildly displaced and medial and volar apex angulated acute fracture. Chronic ulnar styloid fracture. No radiocarpal dislocation. Left hand: No acute fracture or dislocation of the hand identified. Extensive osteoarthrosis of interphalangeal joints with large periarticular osteophytes. Mild basal joint and moderate triscaphe osteoarthrosis. Bones are demineralized. Chronic fracture deformity with apex dorsal angulation of fifth metacarpal head. IMPRESSION: 1. Intraarticular, comminuted, displaced, angulated, and overriding distal radial acute fracture. 2. Nondisplaced mildly angulated distal ulnar diaphysis acute fracture. 3. Chronic ulnar styloid fracture. 4. Chronic fifth metacarpal head fracture. 5. Osteoarthrosis of interphalangeal joints, the basal joint, and triscaphe complex. Electronically Signed   By: Kristine Garbe M.D.   On: 08/15/2016 21:14   Ct Head Wo Contrast  Result Date: 08/15/2016 CLINICAL DATA:  Trip and fall injury at 15:45 today. EXAM: CT HEAD WITHOUT CONTRAST TECHNIQUE: Contiguous axial images were obtained from the base of the skull through the vertex without intravenous contrast. COMPARISON:  None. FINDINGS: Brain: There is no intracranial hemorrhage, mass or evidence of acute infarction. There is moderate generalized atrophy. There is moderate chronic microvascular ischemic change. There is no significant extra-axial fluid collection. No acute intracranial findings are evident. Vascular: No hyperdense vessel or unexpected calcification. Skull: Normal. Negative  for fracture or focal lesion. Sinuses/Orbits: No acute finding. Other: Small volume fat within the falx cerebri, an incidental finding. IMPRESSION: No acute intracranial findings. There is moderate generalized atrophy and chronic appearing white matter hypodensities which likely represent small vessel ischemic disease. Electronically Signed   By: Andreas Newport M.D.   On: 08/15/2016 21:25   Ct Pelvis Wo Contrast  Result Date: 08/16/2016 CLINICAL DATA:  82 year old female with fall and left hip pain. EXAM: CT PELVIS AND LEFT LOWER EXTREMITY WITHOUT CONTRAST TECHNIQUE: Multidetector CT imaging of the pelvis and left lower extremity was performed according to the standard protocol without intravenous contrast. Multiplanar CT image reconstructions were also generated. COMPARISON:  Left femur radiograph dated 08/15/2016 FINDINGS: Evaluation of the pelvic structures is limited in the absence of contrast as well as due to streak artifact caused by lumbar vertebroplasty. CT PELVIS FINDINGS Urinary Tract: There is mild fullness of the visualized inferior portion of the left kidney. The right kidney is not visualized. The urinary bladder is mildly distended and grossly unremarkable. Bowel: Moderate stool noted within the colon. There is sigmoid diverticulosis without active inflammatory changes. No bowel dilatation noted within the pelvis. The appendix is not visualized with certainty. No inflammatory changes identified in the right lower quadrant. Vascular/Lymphatic: There is advanced aortoiliac atherosclerotic disease. Evaluation of the vessels is limited in the absence of intravenous contrast. Reproductive:  The uterus is grossly unremarkable. Other:  None Musculoskeletal: There is advanced osteopenia which limits evaluation for fracture. Multilevel old-appearing lower lumbar compression deformities and vertebroplasty changes. There is  extrusion of small amount of the vertebroplasty cement along the posterior  cortex of the L5. There is cortical discontinuity of the inferior aspect of the left sacral alum (coronal series 203, image 86) concerning for an age indeterminate fracture. There are old healed right pubic bone fractures. There is a minimally displaced comminuted acute fracture of the left superior pubic ramus as well as a nondisplaced fracture of the left inferior pubic ramus. The symphysis pubis appears intact. CT LEFT LOWER EXTREMITY FINDINGS Bones/Joint/Cartilage There is a total left hip arthroplasty. The arthroplasty components appear intact and diffuse anatomic alignment. There is a comminuted appearing nondisplaced sub trochanteric fracture with extension of the fracture in greater trochanter of the femur. The fracture involves the proximal femoral diaphysis. Evaluation of the fracture is very limited due to advanced osteopenia as well as streak artifact caused by orthopedic hardware. The femoral component of the arthroplasty appears to be maintained a satisfactory contact with the adjacent bone. No definite lucency identified adjacent to the hardware. There is no fracture or dislocation of the left knee. Ligaments Suboptimally assessed by CT. Muscles and Tendons Probable mild soft tissue edema in the anterior compartment of the left femur involving the quadriceps musculature. No fluid collection or large hematoma. Soft tissues No large fluid collection or hematoma. IMPRESSION: Fractures of the left pubic rami as well as an age indeterminate fracture of the inferior aspect of the left sacral alum. Nondisplaced comminuted left femoral subtrochanteric fracture with extension into the greater trochanter. The orthopedic hardware appears intact and in anatomic alignment. No dislocation. Electronically Signed   By: Anner Crete M.D.   On: 08/16/2016 01:30   Ct Femur Left Wo Contrast  Result Date: 08/16/2016 CLINICAL DATA:  82 year old female with fall and left hip pain. EXAM: CT PELVIS AND LEFT LOWER  EXTREMITY WITHOUT CONTRAST TECHNIQUE: Multidetector CT imaging of the pelvis and left lower extremity was performed according to the standard protocol without intravenous contrast. Multiplanar CT image reconstructions were also generated. COMPARISON:  Left femur radiograph dated 08/15/2016 FINDINGS: Evaluation of the pelvic structures is limited in the absence of contrast as well as due to streak artifact caused by lumbar vertebroplasty. CT PELVIS FINDINGS Urinary Tract: There is mild fullness of the visualized inferior portion of the left kidney. The right kidney is not visualized. The urinary bladder is mildly distended and grossly unremarkable. Bowel: Moderate stool noted within the colon. There is sigmoid diverticulosis without active inflammatory changes. No bowel dilatation noted within the pelvis. The appendix is not visualized with certainty. No inflammatory changes identified in the right lower quadrant. Vascular/Lymphatic: There is advanced aortoiliac atherosclerotic disease. Evaluation of the vessels is limited in the absence of intravenous contrast. Reproductive:  The uterus is grossly unremarkable. Other:  None Musculoskeletal: There is advanced osteopenia which limits evaluation for fracture. Multilevel old-appearing lower lumbar compression deformities and vertebroplasty changes. There is extrusion of small amount of the vertebroplasty cement along the posterior cortex of the L5. There is cortical discontinuity of the inferior aspect of the left sacral alum (coronal series 203, image 86) concerning for an age indeterminate fracture. There are old healed right pubic bone fractures. There is a minimally displaced comminuted acute fracture of the left superior pubic ramus as well as a nondisplaced fracture of the left inferior pubic ramus. The symphysis pubis appears intact. CT LEFT LOWER EXTREMITY FINDINGS Bones/Joint/Cartilage There is a total left hip arthroplasty. The arthroplasty components appear  intact and diffuse anatomic alignment. There is a comminuted appearing  nondisplaced sub trochanteric fracture with extension of the fracture in greater trochanter of the femur. The fracture involves the proximal femoral diaphysis. Evaluation of the fracture is very limited due to advanced osteopenia as well as streak artifact caused by orthopedic hardware. The femoral component of the arthroplasty appears to be maintained a satisfactory contact with the adjacent bone. No definite lucency identified adjacent to the hardware. There is no fracture or dislocation of the left knee. Ligaments Suboptimally assessed by CT. Muscles and Tendons Probable mild soft tissue edema in the anterior compartment of the left femur involving the quadriceps musculature. No fluid collection or large hematoma. Soft tissues No large fluid collection or hematoma. IMPRESSION: Fractures of the left pubic rami as well as an age indeterminate fracture of the inferior aspect of the left sacral alum. Nondisplaced comminuted left femoral subtrochanteric fracture with extension into the greater trochanter. The orthopedic hardware appears intact and in anatomic alignment. No dislocation. Electronically Signed   By: Anner Crete M.D.   On: 08/16/2016 01:30   Dg Chest Port 1 View  Result Date: 08/17/2016 CLINICAL DATA:  Fall, left rib fracture EXAM: PORTABLE CHEST 1 VIEW COMPARISON:  08/15/2016 FINDINGS: Cardiomediastinal silhouette is stable. Prior vertebroplasty mid thoracic spine again noted. Again noted left upper rib fractures. There is no pneumothorax. Left basilar atelectasis, infiltrate or lung contusion. IMPRESSION: Nondisplaced left rib fractures again noted. No pneumothorax. Left basilar atelectasis, infiltrate or lung contusion. Electronically Signed   By: Lahoma Crocker M.D.   On: 08/17/2016 08:54   Dg Hand Complete Left  Result Date: 08/15/2016 CLINICAL DATA:  82 y/o  F; status post fall with wrist fracture. EXAM: LEFT HAND -  COMPLETE 3+ VIEW; LEFT WRIST - COMPLETE 3+ VIEW COMPARISON:  None. FINDINGS: Left wrist: Intra-articular overriding distal radius acute fracture with comminution, 1 shaft's with volar displacement of the radial shaft relative to the epiphysis, and volar apex angulation. Distal ulnar diaphysis extra-articular mildly displaced and medial and volar apex angulated acute fracture. Chronic ulnar styloid fracture. No radiocarpal dislocation. Left hand: No acute fracture or dislocation of the hand identified. Extensive osteoarthrosis of interphalangeal joints with large periarticular osteophytes. Mild basal joint and moderate triscaphe osteoarthrosis. Bones are demineralized. Chronic fracture deformity with apex dorsal angulation of fifth metacarpal head. IMPRESSION: 1. Intraarticular, comminuted, displaced, angulated, and overriding distal radial acute fracture. 2. Nondisplaced mildly angulated distal ulnar diaphysis acute fracture. 3. Chronic ulnar styloid fracture. 4. Chronic fifth metacarpal head fracture. 5. Osteoarthrosis of interphalangeal joints, the basal joint, and triscaphe complex. Electronically Signed   By: Kristine Garbe M.D.   On: 08/15/2016 21:14   Dg Femur Min 2 Views Left  Result Date: 08/15/2016 CLINICAL DATA:  Left hip pain due to a trip and fall today. EXAM: LEFT FEMUR 2 VIEWS COMPARISON:  None. FINDINGS: Total left hip arthroplasty is in place. The patient has an acute fracture of the subtrochanteric left femur. The fracture may extend into the greater trochanter. There is also a left superior pubic ramus fracture. The left hip is located. IMPRESSION: Acute subtrochanteric left hip fracture with a total hip arthroplasty in place. The fracture may extend to the greater trochanter. Acute left superior pubic ramus fracture. Electronically Signed   By: Inge Rise M.D.   On: 08/15/2016 21:09    Anti-infectives: Anti-infectives    Start     Dose/Rate Route Frequency Ordered Stop    08/16/16 0900  ciprofloxacin (CIPRO) tablet 500 mg  Status:  Discontinued  500 mg Oral 2 times daily 08/16/16 0855 08/16/16 0857   08/16/16 0900  ciprofloxacin (CIPRO) tablet 500 mg    Comments:  Pt started 5-day course of Cipro 08/14/16 prior to admission; this 3-day order will complete the course   500 mg Oral 2 times daily 08/16/16 0857 08/19/16 0759      Assessment/Plan: s/p Procedure(s): OPEN REDUCTION INTERNAL FIXATION (ORIF) WRIST FRACTURE NPO for surgery today.  Hemoglobin a bit low, may need transfusion in the future Platelets are low also.  Will recheck tomorrow.  Can be transferred to floor with telemetry.  LOS: 1 day   Kathryne Eriksson. Dahlia Bailiff, MD, FACS [phone] Trauma Surgeon 08/17/2016

## 2016-08-17 NOTE — Progress Notes (Signed)
PT Cancellation Note  Patient Details Name: Yvonne [name] MRN: [id number] DOB: 1926/04/29   Cancelled Treatment:    Reason Eval/Treat Not Completed: Patient not medically ready Pt on bedrest. Plan for possible OR today. Will await increase in activity orders prior to initiation of PT evaluation.   Marguarite Arbour A Rodney Wigger 08/17/2016, 8:34 AM Wray Kearns, PT, DPT [phone]

## 2016-08-17 NOTE — Anesthesia Preprocedure Evaluation (Signed)
Anesthesia Evaluation  Patient identified by MRN, date of birth, ID band Patient confused    Reviewed: Allergy & Precautions, Patient's Chart, lab work & pertinent test results, Unable to perform ROS - Chart review only  Airway Mallampati: II  TM Distance: >3 FB     Dental  (+) Teeth Intact   Pulmonary shortness of breath,     + decreased breath sounds      Cardiovascular hypertension, + Peripheral Vascular Disease  + dysrhythmias + Valvular Problems/Murmurs  Rhythm:Regular Rate:Normal  Hx of aortic root replacememt   Neuro/Psych    GI/Hepatic   Endo/Other    Renal/GU      Musculoskeletal  (+) Arthritis ,   Abdominal   Peds  Hematology  (+) anemia ,   Anesthesia Other Findings   Reproductive/Obstetrics                             Anesthesia Physical Anesthesia Plan  ASA: IV  Anesthesia Plan: MAC and Regional   Post-op Pain Management:    Induction: Intravenous  Airway Management Planned: Simple Face Mask  Additional Equipment:   Intra-op Plan:   Post-operative Plan:   Informed Consent: I have reviewed the patients History and Physical, chart, labs and discussed the procedure including the risks, benefits and alternatives for the proposed anesthesia with the patient or authorized representative who has indicated his/her understanding and acceptance.   Dental advisory given  Plan Discussed with:   Anesthesia Plan Comments:         Anesthesia Quick Evaluation

## 2016-08-17 NOTE — Anesthesia Procedure Notes (Signed)
Procedure Name: MAC Date/Time: 08/17/2016 5:52 PM Performed by: Lance Coon Pre-anesthesia Checklist: Patient identified, Emergency Drugs available, Suction available, Patient being monitored and Timeout performed Patient Re-evaluated:Patient Re-evaluated prior to inductionOxygen Delivery Method: Simple face mask

## 2016-08-17 NOTE — Progress Notes (Signed)
Patient ID: Yvonne [name], female   DOB: 03/18/1926, 82 y.o.   MRN: [id number] Plan for ORIF today pending OR schedules All questions addressed Anshika Pethtel MD

## 2016-08-17 NOTE — Consult Note (Signed)
Cardiology Consult    Patient ID: Yvonne [name] MRN: [id number], DOB/AGE: 80-Jan-1927   Admit date: 08/15/2016 Date of Consult: 08/17/2016  Primary Physician: Annye Asa, MD Reason for Consult: NSVT Primary Cardiologist: Dr. Sallyanne Kuster Requesting Provider: Dr. Hulen Skains  Patient Profile    Ms. Yvonne [name] is a 82 year old female with a past medical history of repaired rupture of the ascending aortic aneurysm, paroxysmal atrial tachycardia (s/p ablation by Dr. Rayann Heman in 2012), mitral valve prolapse and second-degree AV block Mobitz type I. She presented to the ED on 08/15/16 after a mechanical fall in her driveway.   History of Present Illness    [name] fell in her driveway on [id number], she does not remember falling, but says she remembers walking to the mailbox and isn't sure how she ended up on the ground. She laid in her driveway for about 2 hours before her daughter came home. ED work up revealed Left distal radius, ulna fracture, left pubic rami fractures, left subtrochanteric (periprosthetic) femur fracture and left 3 through 5 rib fractures.   This morning around 11:00 am she had a 8 beat run of wide complex monomorphic tachycardia. She denies chest pain, palpitations and SOB. She is followed by Dr. Sallyanne Kuster for SVT, for which she takes 25mg  of Flecainide BID. Her last Echo was in June 2016, at which time her EF was 60-65% and mild aortic regurgitation.   She is to have surgery on her left distal radius and ulna fracture later today.   Past Medical History   Past Medical History:  Diagnosis Date  . Acute blood loss anemia 2011   WITH ANEURYSM ONLY  . Afib ([place])    -no   . Arthritis   . Cancer ([place]) LATE 1970'S   skin cancer - arm squamous  . Compression fracture of L2 lumbar vertebra ([facility])   . GERD (gastroesophageal reflux disease)    "mild"  . Heart murmur   . History of atrial paroxysmal tachycardia   . History of blood transfusion   . History of  hypothyroidism   . Hypertension   . Hypothyroidism   . Mitral valve prolapse   . Moderate aortic insufficiency 05/25/2014  . Osteoporosis   . Pericardial tamponade 05/2010  . Pneumonia    many years ago  . Premature atrial contractions   . Syncope    VERY RARE  . Thoracic aortic aneurysm ([place]) 05/2010   Ruptured ascending  . UTI (lower urinary tract infection)    CHRONIC  . Volume overload 2011    Past Surgical History:  Procedure Laterality Date  . ABLATION    . APPENDECTOMY    . BREAST SURGERY Right    right biospy  . Classic atrioventricular nodal reentrant  10/20/2010  . COLONOSCOPY    . EUS N/A 02/18/2016   Procedure: UPPER ENDOSCOPIC ULTRASOUND (EUS) LINEAR;  Surgeon: Milus Banister, MD;  Location: [facility];  Service: Endoscopy;  Laterality: N/A;  . EYE SURGERY Bilateral 2003   cataract bilateral  . HERNIA REPAIR  1993   Right Inguinal  . KYPHOPLASTY  09/05/2012   Procedure: KYPHOPLASTY;  Surgeon: Winfield Cunas, MD;  Location: [facility];  Service: Neurosurgery;  Laterality: N/A;  L1 kyphoplasty  . KYPHOPLASTY N/A 04/01/2014   Procedure: KYPHOPLASTY;  Surgeon: Ashok Pall, MD;  Location: [facility];  Service: Neurosurgery;  Laterality: N/A;  Lumbar Four Kyphoplasty  . KYPHOPLASTY N/A 08/07/2015   Procedure: KYPHOPLASTY LUMBAR THREE;  Surgeon: Marylyn Ishihara  Christella Noa, MD;  Location: [place];  Service: Neurosurgery;  Laterality: N/A;  . KYPHOPLASTY N/A 12/09/2015   Procedure: Lumbar five, KYPHOPLASTY;  Surgeon: Ashok Pall, MD;  Location: [facility];  Service: Neurosurgery;  Laterality: N/A;  L5 kyphoplasty  . KYPHOPLASTY N/A 01/13/2016   Procedure: Thoracic seven KYPHOPLASTY;  Surgeon: Ashok Pall, MD;  Location: [facility];  Service: Neurosurgery;  Laterality: N/A;  . KYPHOPLASTY N/A 01/22/2016   Procedure: THORACIC EIGHT KYPHOPLASTY;  Surgeon: Ashok Pall, MD;  Location: [place];  Service: Neurosurgery;  Laterality: N/A;  T8 KYPHOPLASTY  . L2 kyphoplasty  using Kyphon system.  03/17/2009  . PERCUTANEOUS PINNING WRIST FRACTURE Bilateral 1995   bil  . Replacement of ascending aorta and hemiarch and resuspension of the aortic valve with circulatory cardiopulmonary bypass and circulatory arrest  06/25/2010   Dr Servando Snare  . Revision of left total hip replacement using some allograft  11/23/2010  . TOE OSTEOTOMY     Right 2nd   . TONSILLECTOMY    . TUBAL LIGATION  1966     Allergies  No Known Allergies  Inpatient Medications    . amLODipine  5 mg Oral Daily  . ciprofloxacin  500 mg Oral BID  . flecainide  25 mg Oral Q12H  . levothyroxine  50 mcg Oral QAC breakfast  . losartan  25 mg Oral QHS  . losartan  50 mg Oral Daily  . mouth rinse  15 mL Mouth Rinse BID  . traMADol  50 mg Oral Q6H    Family History    Family History  Problem Relation Age of Onset  . Heart failure Mother   . COPD Mother     never smoker  . Stroke Father   . Hypertension Brother   . Hypertension Brother   . Hypertension Sister   . Hypertension Sister   . Stroke Sister   . Stomach cancer      nephew  . Breast cancer      neice    Social History    Social History   Social History  . Marital status: Widowed    Spouse name: N/A  . Number of children: N/A  . Years of education: N/A   Occupational History  . Not on file.   Social History Main Topics  . Smoking status: Never Smoker  . Smokeless tobacco: Never Used  . Alcohol use No  . Drug use: No  . Sexual activity: Not on file   Other Topics Concern  . Not on file   Social History Narrative  . No narrative on file     Review of Systems    General:  No chills, fever, night sweats or weight changes.  Cardiovascular:  No chest pain, dyspnea on exertion, edema, orthopnea, palpitations, paroxysmal nocturnal dyspnea. Dermatological: No rash, lesions/masses Respiratory: No cough, dyspnea Urologic: No hematuria, dysuria Abdominal:   No nausea, vomiting, diarrhea, bright red blood per  rectum, melena, or hematemesis Neurologic:  No visual changes, wkns, changes in mental status. All other systems reviewed and are otherwise negative except as noted above.  Physical Exam    Blood pressure (!) 123/47, pulse 72, temperature 98.7 F (37.1 C), temperature source Oral, resp. rate 12, height 5\' 6"  (1.676 m), weight 118 lb (53.5 kg), SpO2 96 %.  General: Pleasant elderly female in no acute distress  Psych: Normal affect. Neuro: Alert and oriented X 3. Moves all extremities spontaneously. HEENT: Normal, bruising noted on left temple  Neck: Supple without bruits or JVD. Lungs:  Resp regular and unlabored, CTA. Heart: RRR no s3, s4. 2/6 systolic murmurs. Abdomen: Soft, non-tender, non-distended, BS + x 4.  Extremities: DP/PT/Radials 2+ and equal bilaterally. Diffuse ecchymosis due to recent fall .  Labs     Lab Results  Component Value Date   WBC 6.8 08/17/2016   HGB 8.5 (L) 08/17/2016   HCT 26.6 (L) 08/17/2016   MCV 97.1 08/17/2016   PLT 73 (L) 08/17/2016    Recent Labs Lab 08/17/16 0406  NA 138  K 4.0  CL 107  CO2 26  BUN 10  CREATININE 0.83  CALCIUM 8.3*  GLUCOSE 151*   Lab Results  Component Value Date   CHOL 162 08/08/2016   HDL 86.00 08/08/2016   LDLCALC 59 08/08/2016   TRIG 85.0 08/08/2016    Radiology Studies    Dg Chest 1 View  Result Date: 08/15/2016 CLINICAL DATA:  Unwitnessed fall today. Was probably on the ground for several hours. EXAM: CHEST 1 VIEW COMPARISON:  08/04/2016 FINDINGS: Fractures of the left third fourth and fifth ribs, mildly displaced. No pneumothorax. No large effusion. Normal mediastinal contours. The lungs are clear. IMPRESSION: Left third through fifth rib fractures. Electronically Signed   By: Andreas Newport M.D.   On: 08/15/2016 21:07   Dg Pelvis 1-2 Views  Result Date: 08/15/2016 CLINICAL DATA:  [place] outside of while walking to or mL boxes afternoon. EXAM: PELVIS - 1-2 VIEW COMPARISON:  12/30/2015 FINDINGS:  Chronic fracture deformities of the pubic rami bilaterally. There is superimposed acute fracture of the left femur just below the trochanters and superimposed acute fracture of the superior left pubic ramus. No dislocation. IMPRESSION: Chronic pubic ramus fracture deformities with superimposed acute fractures of the left superior ramus and of the left subtrochanteric femur. Electronically Signed   By: Andreas Newport M.D.   On: 08/15/2016 21:14   Dg Elbow Complete Left  Result Date: 08/15/2016 CLINICAL DATA:  Left elbow injury and pain due to a trip and fall today. Initial encounter. EXAM: LEFT ELBOW - COMPLETE 3+ VIEW COMPARISON:  None. FINDINGS: There is no evidence of fracture, dislocation, or joint effusion. There is no evidence of arthropathy or other focal bone abnormality. Soft tissues are unremarkable. IMPRESSION: Negative exam. Electronically Signed   By: Inge Rise M.D.   On: 08/15/2016 21:05   Dg Wrist Complete Left  Result Date: 08/16/2016 CLINICAL DATA:  82 year old female status post reduction of left wrist fracture. EXAM: LEFT WRIST - COMPLETE 3+ VIEW COMPARISON:  Earlier radiograph dated 08/15/2016 FINDINGS: There has been interval reduction of the angulation and degree of overriding of the distal radial intra-articular and comminuted fracture. The distal ulna fracture appears similar to prior radiograph. Old ulnar-styloid fracture. There has been interval placement of a cast over the distal forearm and wrist. There is osteopenia with osteoarthritic changes of the carpal bones. IMPRESSION: Interval reduction of the degree of angulation of the distal radial fracture in cast placement. Distal ulna fracture appears similar to the prior radiograph. Overall there is reduction of a angulated deformity of the wrist since the prior radiograph. Electronically Signed   By: Anner Crete M.D.   On: 08/16/2016 00:16   Dg Wrist Complete Left  Result Date: 08/15/2016 CLINICAL DATA:  82  y/o  F; status post fall with wrist fracture. EXAM: LEFT HAND - COMPLETE 3+ VIEW; LEFT WRIST - COMPLETE 3+ VIEW COMPARISON:  None. FINDINGS: Left wrist: Intra-articular overriding distal radius acute  fracture with comminution, 1 shaft's with volar displacement of the radial shaft relative to the epiphysis, and volar apex angulation. Distal ulnar diaphysis extra-articular mildly displaced and medial and volar apex angulated acute fracture. Chronic ulnar styloid fracture. No radiocarpal dislocation. Left hand: No acute fracture or dislocation of the hand identified. Extensive osteoarthrosis of interphalangeal joints with large periarticular osteophytes. Mild basal joint and moderate triscaphe osteoarthrosis. Bones are demineralized. Chronic fracture deformity with apex dorsal angulation of fifth metacarpal head. IMPRESSION: 1. Intraarticular, comminuted, displaced, angulated, and overriding distal radial acute fracture. 2. Nondisplaced mildly angulated distal ulnar diaphysis acute fracture. 3. Chronic ulnar styloid fracture. 4. Chronic fifth metacarpal head fracture. 5. Osteoarthrosis of interphalangeal joints, the basal joint, and triscaphe complex. Electronically Signed   By: Kristine Garbe M.D.   On: 08/15/2016 21:14   Ct Head Wo Contrast  Result Date: 08/15/2016 CLINICAL DATA:  Trip and fall injury at 15:45 today. EXAM: CT HEAD WITHOUT CONTRAST TECHNIQUE: Contiguous axial images were obtained from the base of the skull through the vertex without intravenous contrast. COMPARISON:  None. FINDINGS: Brain: There is no intracranial hemorrhage, mass or evidence of acute infarction. There is moderate generalized atrophy. There is moderate chronic microvascular ischemic change. There is no significant extra-axial fluid collection. No acute intracranial findings are evident. Vascular: No hyperdense vessel or unexpected calcification. Skull: Normal. Negative for fracture or focal lesion. Sinuses/Orbits: No  acute finding. Other: Small volume fat within the falx cerebri, an incidental finding. IMPRESSION: No acute intracranial findings. There is moderate generalized atrophy and chronic appearing white matter hypodensities which likely represent small vessel ischemic disease. Electronically Signed   By: Andreas Newport M.D.   On: 08/15/2016 21:25   Ct Pelvis Wo Contrast  Result Date: 08/16/2016 CLINICAL DATA:  82 year old female with fall and left hip pain. EXAM: CT PELVIS AND LEFT LOWER EXTREMITY WITHOUT CONTRAST TECHNIQUE: Multidetector CT imaging of the pelvis and left lower extremity was performed according to the standard protocol without intravenous contrast. Multiplanar CT image reconstructions were also generated. COMPARISON:  Left femur radiograph dated 08/15/2016 FINDINGS: Evaluation of the pelvic structures is limited in the absence of contrast as well as due to streak artifact caused by lumbar vertebroplasty. CT PELVIS FINDINGS Urinary Tract: There is mild fullness of the visualized inferior portion of the left kidney. The right kidney is not visualized. The urinary bladder is mildly distended and grossly unremarkable. Bowel: Moderate stool noted within the colon. There is sigmoid diverticulosis without active inflammatory changes. No bowel dilatation noted within the pelvis. The appendix is not visualized with certainty. No inflammatory changes identified in the right lower quadrant. Vascular/Lymphatic: There is advanced aortoiliac atherosclerotic disease. Evaluation of the vessels is limited in the absence of intravenous contrast. Reproductive:  The uterus is grossly unremarkable. Other:  None Musculoskeletal: There is advanced osteopenia which limits evaluation for fracture. Multilevel old-appearing lower lumbar compression deformities and vertebroplasty changes. There is extrusion of small amount of the vertebroplasty cement along the posterior cortex of the L5. There is cortical discontinuity of  the inferior aspect of the left sacral alum (coronal series 203, image 86) concerning for an age indeterminate fracture. There are old healed right pubic bone fractures. There is a minimally displaced comminuted acute fracture of the left superior pubic ramus as well as a nondisplaced fracture of the left inferior pubic ramus. The symphysis pubis appears intact. CT LEFT LOWER EXTREMITY FINDINGS Bones/Joint/Cartilage There is a total left hip arthroplasty. The arthroplasty components appear intact and diffuse  anatomic alignment. There is a comminuted appearing nondisplaced sub trochanteric fracture with extension of the fracture in greater trochanter of the femur. The fracture involves the proximal femoral diaphysis. Evaluation of the fracture is very limited due to advanced osteopenia as well as streak artifact caused by orthopedic hardware. The femoral component of the arthroplasty appears to be maintained a satisfactory contact with the adjacent bone. No definite lucency identified adjacent to the hardware. There is no fracture or dislocation of the left knee. Ligaments Suboptimally assessed by CT. Muscles and Tendons Probable mild soft tissue edema in the anterior compartment of the left femur involving the quadriceps musculature. No fluid collection or large hematoma. Soft tissues No large fluid collection or hematoma. IMPRESSION: Fractures of the left pubic rami as well as an age indeterminate fracture of the inferior aspect of the left sacral alum. Nondisplaced comminuted left femoral subtrochanteric fracture with extension into the greater trochanter. The orthopedic hardware appears intact and in anatomic alignment. No dislocation. Electronically Signed   By: Anner Crete M.D.   On: 08/16/2016 01:30   Ct Femur Left Wo Contrast  Result Date: 08/16/2016 CLINICAL DATA:  82 year old female with fall and left hip pain. EXAM: CT PELVIS AND LEFT LOWER EXTREMITY WITHOUT CONTRAST TECHNIQUE: Multidetector CT  imaging of the pelvis and left lower extremity was performed according to the standard protocol without intravenous contrast. Multiplanar CT image reconstructions were also generated. COMPARISON:  Left femur radiograph dated 08/15/2016 FINDINGS: Evaluation of the pelvic structures is limited in the absence of contrast as well as due to streak artifact caused by lumbar vertebroplasty. CT PELVIS FINDINGS Urinary Tract: There is mild fullness of the visualized inferior portion of the left kidney. The right kidney is not visualized. The urinary bladder is mildly distended and grossly unremarkable. Bowel: Moderate stool noted within the colon. There is sigmoid diverticulosis without active inflammatory changes. No bowel dilatation noted within the pelvis. The appendix is not visualized with certainty. No inflammatory changes identified in the right lower quadrant. Vascular/Lymphatic: There is advanced aortoiliac atherosclerotic disease. Evaluation of the vessels is limited in the absence of intravenous contrast. Reproductive:  The uterus is grossly unremarkable. Other:  None Musculoskeletal: There is advanced osteopenia which limits evaluation for fracture. Multilevel old-appearing lower lumbar compression deformities and vertebroplasty changes. There is extrusion of small amount of the vertebroplasty cement along the posterior cortex of the L5. There is cortical discontinuity of the inferior aspect of the left sacral alum (coronal series 203, image 86) concerning for an age indeterminate fracture. There are old healed right pubic bone fractures. There is a minimally displaced comminuted acute fracture of the left superior pubic ramus as well as a nondisplaced fracture of the left inferior pubic ramus. The symphysis pubis appears intact. CT LEFT LOWER EXTREMITY FINDINGS Bones/Joint/Cartilage There is a total left hip arthroplasty. The arthroplasty components appear intact and diffuse anatomic alignment. There is a  comminuted appearing nondisplaced sub trochanteric fracture with extension of the fracture in greater trochanter of the femur. The fracture involves the proximal femoral diaphysis. Evaluation of the fracture is very limited due to advanced osteopenia as well as streak artifact caused by orthopedic hardware. The femoral component of the arthroplasty appears to be maintained a satisfactory contact with the adjacent bone. No definite lucency identified adjacent to the hardware. There is no fracture or dislocation of the left knee. Ligaments Suboptimally assessed by CT. Muscles and Tendons Probable mild soft tissue edema in the anterior compartment of the left femur involving  the quadriceps musculature. No fluid collection or large hematoma. Soft tissues No large fluid collection or hematoma. IMPRESSION: Fractures of the left pubic rami as well as an age indeterminate fracture of the inferior aspect of the left sacral alum. Nondisplaced comminuted left femoral subtrochanteric fracture with extension into the greater trochanter. The orthopedic hardware appears intact and in anatomic alignment. No dislocation. Electronically Signed   By: Anner Crete M.D.   On: 08/16/2016 01:30   Dg Chest Port 1 View  Result Date: 08/17/2016 CLINICAL DATA:  Fall, left rib fracture EXAM: PORTABLE CHEST 1 VIEW COMPARISON:  08/15/2016 FINDINGS: Cardiomediastinal silhouette is stable. Prior vertebroplasty mid thoracic spine again noted. Again noted left upper rib fractures. There is no pneumothorax. Left basilar atelectasis, infiltrate or lung contusion. IMPRESSION: Nondisplaced left rib fractures again noted. No pneumothorax. Left basilar atelectasis, infiltrate or lung contusion. Electronically Signed   By: Lahoma Crocker M.D.   On: 08/17/2016 08:54   Dg Hand Complete Left  Result Date: 08/15/2016 CLINICAL DATA:  82 y/o  F; status post fall with wrist fracture. EXAM: LEFT HAND - COMPLETE 3+ VIEW; LEFT WRIST - COMPLETE 3+ VIEW  COMPARISON:  None. FINDINGS: Left wrist: Intra-articular overriding distal radius acute fracture with comminution, 1 shaft's with volar displacement of the radial shaft relative to the epiphysis, and volar apex angulation. Distal ulnar diaphysis extra-articular mildly displaced and medial and volar apex angulated acute fracture. Chronic ulnar styloid fracture. No radiocarpal dislocation. Left hand: No acute fracture or dislocation of the hand identified. Extensive osteoarthrosis of interphalangeal joints with large periarticular osteophytes. Mild basal joint and moderate triscaphe osteoarthrosis. Bones are demineralized. Chronic fracture deformity with apex dorsal angulation of fifth metacarpal head. IMPRESSION: 1. Intraarticular, comminuted, displaced, angulated, and overriding distal radial acute fracture. 2. Nondisplaced mildly angulated distal ulnar diaphysis acute fracture. 3. Chronic ulnar styloid fracture. 4. Chronic fifth metacarpal head fracture. 5. Osteoarthrosis of interphalangeal joints, the basal joint, and triscaphe complex. Electronically Signed   By: Kristine Garbe M.D.   On: 08/15/2016 21:14   Ct Angio Chest Aorta W &/or Wo Contrast  Result Date: 08/04/2016 CLINICAL DATA:  Thoracic aortic aneurysm, status post graft repair 6 years ago. EXAM: CT ANGIOGRAPHY CHEST WITH CONTRAST TECHNIQUE: Multidetector CT imaging of the chest was performed using the standard protocol during bolus administration of intravenous contrast. Multiplanar CT image reconstructions and MIPs were obtained to evaluate the vascular anatomy. CONTRAST:  75 cc Isovue 370 IV COMPARISON:  07/05/2015 FINDINGS: Cardiovascular: Status post graft repair of the ascending thoracic aorta. Continued dilatation. The proximal ascending aorta measures 4.6 cm, stable. The distal ascending aorta measures 5.8 cm compared to 6 cm previously. No change. No dissection. Heart is enlarged. Prior median sternotomy. Calcifications in the left  main and left anterior descending coronary arteries. Mediastinum/Nodes: HO No mediastinal, hilar, or axillary adenopathy. Lungs/Pleura: Scarring in the apices, stable. No confluent airspace opacities, suspicious pulmonary nodules or pleural effusions. Upper Abdomen: Imaging into the upper abdomen shows no acute findings. Musculoskeletal: Chest wall soft tissues are unremarkable. No acute bony abnormality or focal bone lesion. Changes of multi level vertebral augmentation in the thoracic and upper lumbar spine. Stable severe compression fracture at T12 without vertebral augmentation changes. Review of the MIP images confirms the above findings. IMPRESSION: Stable dilatation of the ascending thoracic aorta, status post graft repair. No dissection. No change since prior study. Biapical scarring. Coronary artery disease. Multilevel compression fractures status post vertebral augmentation. Electronically Signed   By: Rolm Baptise  M.D.   On: 08/04/2016 13:01   Dg Femur Min 2 Views Left  Result Date: 08/15/2016 CLINICAL DATA:  Left hip pain due to a trip and fall today. EXAM: LEFT FEMUR 2 VIEWS COMPARISON:  None. FINDINGS: Total left hip arthroplasty is in place. The patient has an acute fracture of the subtrochanteric left femur. The fracture may extend into the greater trochanter. There is also a left superior pubic ramus fracture. The left hip is located. IMPRESSION: Acute subtrochanteric left hip fracture with a total hip arthroplasty in place. The fracture may extend to the greater trochanter. Acute left superior pubic ramus fracture. Electronically Signed   By: Inge Rise M.D.   On: 08/15/2016 21:09    EKG & Cardiac Imaging    EKG: NSR with LVH   Echocardiogram: pending   Assessment & Plan    1. Nonsustained VT: Patient had an isolated episode of VT (8 beats) while on telemetry. She was asymptomatic and hemodynamically stable at the time. She had a recent fall and her description is worrisome  for syncope, however her daughter says that her mom suffers from chronic vertigo and has had 6 falls in the past year. Sounds like this is more of a mechanical fall.   We can continue to monitor her on telemetry while here and can set her up for an outpatient monitor when she goes home. We will also repeat an Echo while she is here, she had no LV dysfunction one year ago.   Flecainide does carry a risk of ventricular arrhythmias, however she has been on this for years and she cannot tolerate beta blockers (have caused Mobitz type II) in the past. We will continue Flecainide.   2. Syncope: See discussion above, will set up an event monitor at discharge.   3. Multiple fractures: Per primary team.     Signed, Arbutus Leas, NP 08/17/2016, 2:46 PM Pager: [phone]

## 2016-08-18 ENCOUNTER — Encounter (HOSPITAL_COMMUNITY): Payer: Self-pay | Admitting: Orthopedic Surgery

## 2016-08-18 ENCOUNTER — Inpatient Hospital Stay (HOSPITAL_COMMUNITY): Payer: Medicare Other

## 2016-08-18 DIAGNOSIS — S32512A Fracture of superior rim of left pubis, initial encounter for closed fracture: Secondary | ICD-10-CM

## 2016-08-18 DIAGNOSIS — R55 Syncope and collapse: Secondary | ICD-10-CM

## 2016-08-18 DIAGNOSIS — S62102A Fracture of unspecified carpal bone, left wrist, initial encounter for closed fracture: Secondary | ICD-10-CM | POA: Diagnosis present

## 2016-08-18 DIAGNOSIS — S7225XA Nondisplaced subtrochanteric fracture of left femur, initial encounter for closed fracture: Secondary | ICD-10-CM

## 2016-08-18 DIAGNOSIS — W19XXXS Unspecified fall, sequela: Secondary | ICD-10-CM

## 2016-08-18 DIAGNOSIS — S2242XA Multiple fractures of ribs, left side, initial encounter for closed fracture: Secondary | ICD-10-CM | POA: Diagnosis present

## 2016-08-18 DIAGNOSIS — S7222XA Displaced subtrochanteric fracture of left femur, initial encounter for closed fracture: Secondary | ICD-10-CM | POA: Diagnosis present

## 2016-08-18 LAB — ECHOCARDIOGRAM COMPLETE
Height: 66 in
WEIGHTICAEL: 1977.6 [oz_av]

## 2016-08-18 MED ORDER — HYDROCODONE-ACETAMINOPHEN 5-325 MG PO TABS
0.5000 | ORAL_TABLET | ORAL | Status: DC | PRN
  Filled 2016-08-18: qty 2

## 2016-08-18 MED ORDER — DOCUSATE SODIUM 100 MG PO CAPS
100.0000 mg | ORAL_CAPSULE | Freq: Two times a day (BID) | ORAL | Status: DC
  Administered 2016-08-18 – 2016-08-20 (×5): 100 mg via ORAL
  Filled 2016-08-18 (×5): qty 1

## 2016-08-18 MED ORDER — HYDROMORPHONE HCL 2 MG/ML IJ SOLN
0.5000 mg | INTRAMUSCULAR | Status: DC | PRN
  Administered 2016-08-19: 0.5 mg via INTRAVENOUS
  Filled 2016-08-18: qty 1

## 2016-08-18 MED ORDER — POLYETHYLENE GLYCOL 3350 17 G PO PACK
17.0000 g | PACK | Freq: Every day | ORAL | Status: DC
  Administered 2016-08-18 – 2016-08-20 (×3): 17 g via ORAL
  Filled 2016-08-18 (×3): qty 1

## 2016-08-18 NOTE — Consult Note (Addendum)
Physical Medicine and Rehabilitation Consult  Reason for Consult:  Left radius/ulna, left femur and left pelvic fracture Referring Physician: Dr. Grandville Silos   HPI: Yvonne [name] is a 82 y.o. female with history of  HTN, A Fib, vertigo, chronic UTIs, thoracic aneurysm rupture--CIR stay 2003, [facility] who sustained a fall due to dizziness on 08/15/16 with subsequent left distal comminuted distal radius fracture,  chronic with acute left pubic rami fracture and left subtrochanteric femur fracture.  Dr. Rolena Infante felt that left hip did not need surgery and to be TDWB. Left elbow splinted and patient cleared for surgery by cardiology. She underwent ORIF left radius and closed manipulation of distal ulna fracture by Dr. Amedeo Plenty on 12/21. Post op with "gentle ginger" weight bearing to right elbow. She did have few beats NSVT post op felt to be insignificant by cards. PT/OT evaluations completed revealing impairments in mobility and self care. CIR recommended for follow up therapy.   Patient lives by herself,  neighbors did not see her collapsed on the driveway for an extended period of time.  Review of Systems  HENT: Positive for hearing loss.   Eyes: Negative for blurred vision and double vision.  Respiratory: Negative for sputum production and shortness of breath.   Cardiovascular: Negative for chest pain and palpitations.  Gastrointestinal: Positive for constipation. Negative for abdominal pain, heartburn and nausea.  Genitourinary: Positive for frequency and urgency.  Neurological: Positive for dizziness. Negative for tingling and headaches.  Psychiatric/Behavioral: Negative for depression. The patient has insomnia (occasionally).       Past Medical History:  Diagnosis Date  . Acute blood loss anemia 2011   WITH ANEURYSM ONLY  . Afib ([place])    -no   . Arthritis   . Cancer ([place]) LATE 1970'S   skin cancer - arm squamous  . Compression fracture of L2 lumbar vertebra ([facility])   . GERD  (gastroesophageal reflux disease)    "mild"  . Heart murmur   . History of atrial paroxysmal tachycardia   . History of blood transfusion   . History of hypothyroidism   . Hypertension   . Hypothyroidism   . Mitral valve prolapse   . Moderate aortic insufficiency 05/25/2014  . Osteoporosis   . Pericardial tamponade 05/2010  . Pneumonia    many years ago  . Premature atrial contractions   . Syncope    VERY RARE  . Thoracic aortic aneurysm ([place]) 05/2010   Ruptured ascending  . UTI (lower urinary tract infection)    CHRONIC  . Volume overload 2011    Past Surgical History:  Procedure Laterality Date  . ABLATION    . APPENDECTOMY    . BREAST SURGERY Right    right biospy  . Classic atrioventricular nodal reentrant  10/20/2010  . COLONOSCOPY    . EUS N/A 02/18/2016   Procedure: UPPER ENDOSCOPIC ULTRASOUND (EUS) LINEAR;  Surgeon: Milus Banister, MD;  Location: [facility];  Service: Endoscopy;  Laterality: N/A;  . EYE SURGERY Bilateral 2003   cataract bilateral  . HERNIA REPAIR  1993   Right Inguinal  . KYPHOPLASTY  09/05/2012   Procedure: KYPHOPLASTY;  Surgeon: Winfield Cunas, MD;  Location: [place] [facility]URO ORS;  Service: Neurosurgery;  Laterality: N/A;  L1 kyphoplasty  . KYPHOPLASTY N/A 04/01/2014   Procedure: KYPHOPLASTY;  Surgeon: Ashok Pall, MD;  Location: [facility] NEURO ORS;  Service: Neurosurgery;  Laterality: N/A;  Lumbar Four Kyphoplasty  . KYPHOPLASTY N/A 08/07/2015   Procedure: KYPHOPLASTY  LUMBAR THREE;  Surgeon: Ashok Pall, MD;  Location: [place] NEURO ORS;  Service: Neurosurgery;  Laterality: N/A;  . KYPHOPLASTY N/A 12/09/2015   Procedure: Lumbar five, KYPHOPLASTY;  Surgeon: Ashok Pall, MD;  Location: [facility] NEURO ORS;  Service: Neurosurgery;  Laterality: N/A;  L5 kyphoplasty  . KYPHOPLASTY N/A 01/13/2016   Procedure: Thoracic seven KYPHOPLASTY;  Surgeon: Ashok Pall, MD;  Location: [place] NEURO ORS;  Service: Neurosurgery;  Laterality: N/A;  . KYPHOPLASTY N/A 01/22/2016   Procedure:  THORACIC EIGHT KYPHOPLASTY;  Surgeon: Ashok Pall, MD;  Location: [place] NEURO ORS;  Service: Neurosurgery;  Laterality: N/A;  T8 KYPHOPLASTY  . L2 kyphoplasty using Kyphon system.  03/17/2009  . ORIF WRIST FRACTURE Left 08/17/2016   Procedure: OPEN REDUCTION INTERNAL FIXATION (ORIF) WRIST FRACTURE;  Surgeon: Roseanne Kaufman, MD;  Location: [facility];  Service: Orthopedics;  Laterality: Left;  block done as well   . PERCUTANEOUS PINNING WRIST FRACTURE Bilateral 1995   bil  . Replacement of ascending aorta and hemiarch and resuspension of the aortic valve with circulatory cardiopulmonary bypass and circulatory arrest  06/25/2010   Dr Servando Snare  . Revision of left total hip replacement using some allograft  11/23/2010  . TOE OSTEOTOMY     Right 2nd   . TONSILLECTOMY    . TUBAL LIGATION  1966    Family History  Problem Relation Age of Onset  . Heart failure Mother   . COPD Mother     never smoker  . Stroke Father   . Hypertension Brother   . Hypertension Brother   . Hypertension Sister   . Hypertension Sister   . Stroke Sister   . Stomach cancer      nephew  . Breast cancer      neice    Social History:  Lives with daughter. Sedentary but independent PTA--used walker early am and cane later in the day. Retired Agricultural consultant. She reports that she has never smoked. She has never used smokeless tobacco. She reports that she does not drink alcohol or use drugs.    Allergies: No Known Allergies    Medications Prior to Admission  Medication Sig Dispense Refill  . acetaminophen (TYLENOL) 500 MG tablet Take 1,000 mg by mouth every 6 (six) hours as needed (For pain.).    Marland Kitchen amLODipine (NORVASC) 5 MG tablet TAKE 1 TABLET BY MOUTH  DAILY 90 tablet 2  . amoxicillin (AMOXIL) 500 MG tablet Take 4 tablets (2gram) by mouth one hour prior to dental work. 20 tablet 0  . aspirin EC 325 MG tablet Take 325 mg by mouth every evening.     . calcium-vitamin D (OSCAL WITH D) 500-200 MG-UNIT per tablet  Take 1 tablet by mouth daily at 12 noon.    . cholecalciferol (VITAMIN D) 1000 UNITS tablet Take 2,000 Units by mouth daily at 12 noon.     . ciprofloxacin (CIPRO) 500 MG tablet Take 1 tablet (500 mg total) by mouth 2 (two) times daily. 14 tablet 0  . flecainide (TAMBOCOR) 50 MG tablet Take one-half tablet by  mouth two times daily 90 tablet 2  . ibuprofen (ADVIL,MOTRIN) 200 MG tablet Take 400 mg by mouth every 6 (six) hours as needed (For pain.).    Marland Kitchen levothyroxine (SYNTHROID, LEVOTHROID) 50 MCG tablet Take 1 tablet (50 mcg total) by mouth every morning. 90 tablet 1  . losartan (COZAAR) 50 MG tablet Take 25-50 mg by mouth daily. Take whole tablet in the morning then take half tablet  at night    . Multiple Vitamin (MULTIVITAMIN WITH MINERALS) TABS tablet Take 1 tablet by mouth daily.      Home: Home Living Family/patient expects to be discharged to:: Inpatient rehab Living Arrangements: Children Additional Comments: pt has an apartment in the basement of her daughter's home.    Functional History: Prior Function Level of Independence: Independent with assistive device(s) Comments: Used SPC to ambulate, didn't cook much but independent in ADL/IADl Functional Status:  Mobility: Bed Mobility Overal bed mobility: Needs Assistance Bed Mobility: Supine to Sit Supine to sit: Max assist, +2 for physical assistance General bed mobility comments: cues for step-by-step through bed mobility.  pt moves very slow due to pain in pelivs.   Transfers Overall transfer level: Needs assistance Equipment used: Left platform walker, 2 person hand held assist Transfers: Sit to/from Stand, Stand Pivot Transfers Sit to Stand: Mod assist, +2 physical assistance Stand pivot transfers: Max assist, +2 physical assistance General transfer comment: Initially attempted coming to standing with use of PFRW, however pt unable to maintain L LE TDWB to attempt transfer to recliner.  Came to stand a 2nd time with 2 person  A and able to better A pt and complete pivot towards R side to recliner.  pt continues to have difficulty maintaining TDWBing.        ADL: ADL Overall ADL's : Needs assistance/impaired Eating/Feeding: Modified independent, Sitting Grooming: Moderate assistance, Sitting Upper Body Bathing: Moderate assistance, Sitting, Bed level Lower Body Bathing: Maximal assistance, Bed level, Sitting/lateral leans Upper Body Dressing : Moderate assistance, Sitting Lower Body Dressing: Maximal assistance, Bed level, +2 for physical assistance Toilet Transfer: +2 for physical assistance, Maximal assistance, +2 for safety/equipment, Stand-pivot, BSC Toilet Transfer Details (indicate cue type and reason): transfer to the right to allow Pt to use strong leg to assist, Pt wants to help and gives good effort, generalized weakness Toileting- Clothing Manipulation and Hygiene: Total assistance, Sit to/from stand Toileting - Clothing Manipulation Details (indicate cue type and reason): hospital gown Functional mobility during ADLs:  (only stand pivot and sit <> stand attempted this session) General ADL Comments: Pt limited by mobility and limited BUE tasks. Pt independent PTA. Will benefit from CIR level therapyto return to PLOF.  Cognition: Cognition Overall Cognitive Status: Within Functional Limits for tasks assessed Orientation Level: Oriented X4 Cognition Arousal/Alertness: Awake/alert Behavior During Therapy: WFL for tasks assessed/performed Overall Cognitive Status: Within Functional Limits for tasks assessed  Blood pressure (!) 111/45, pulse 72, temperature 98 F (36.7 C), temperature source Oral, resp. rate 18, height 5\' 6"  (1.676 m), weight 56.1 kg (123 lb 9.6 oz), SpO2 100 %. Physical Exam  Nursing note and vitals reviewed. Constitutional: She is oriented to person, place, and time. She appears well-developed and well-nourished. No distress.  HENT:  Head: Normocephalic and atraumatic.    Mouth/Throat: Oropharynx is clear and moist.  Eyes: Conjunctivae are normal. Pupils are equal, round, and reactive to light.  Neck: Normal range of motion. Neck supple.  Cardiovascular: Normal rate and regular rhythm.   Murmur heard. Respiratory: Effort normal. No stridor. She has decreased breath sounds in the left lower field.  GI: Soft. Bowel sounds are normal. She exhibits no distension. There is no tenderness.  Musculoskeletal:  LUE with splint and in foam elevator. Bilateral shins with multiple ecchymotic areas.  Pain left hip with attempts at ROM.   Neurological: She is alert and oriented to person, place, and time.  Skin: Skin is warm and dry. She is not  diaphoretic.  Psychiatric: She has a normal mood and affect. Her behavior is normal. Thought content normal.    No results found for this or any previous visit (from the past 24 hour(s)). Dg Chest Port 1 View  Result Date: 08/17/2016 CLINICAL DATA:  Fall, left rib fracture EXAM: PORTABLE CHEST 1 VIEW COMPARISON:  08/15/2016 FINDINGS: Cardiomediastinal silhouette is stable. Prior vertebroplasty mid thoracic spine again noted. Again noted left upper rib fractures. There is no pneumothorax. Left basilar atelectasis, infiltrate or lung contusion. IMPRESSION: Nondisplaced left rib fractures again noted. No pneumothorax. Left basilar atelectasis, infiltrate or lung contusion. Electronically Signed   By: Lahoma Crocker M.D.   On: 08/17/2016 08:54    Assessment/Plan: Diagnosis: Polytrauma due to fall, pelvic and subtrochanteric fractures as well as left upper rib fractures 1. Does the need for close, 24 hr/day medical supervision in concert with the patient's rehab needs make it unreasonable for this patient to be served in a less intensive setting? Potentially 2. Co-Morbidities requiring supervision/potential complications: Osteoporosis with history of multiple vertebral compression fractures, hypertension, atrial fibrillation 3. Due to  bladder management, bowel management, safety, skin/wound care, disease management, medication administration, pain management and patient education, does the patient require 24 hr/day rehab nursing? Potentially 4. Does the patient require coordinated care of a physician, rehab nurse, PT, OT to address physical and functional deficits in the context of the above medical diagnosis(es)? Potentially Addressing deficits in the following areas: balance, endurance, locomotion, strength, transferring, bowel/bladder control, bathing, dressing and psychosocial support 5. Can the patient actively participate in an intensive therapy program of at least 3 hrs of therapy per day at least 5 days per week? No 6. The potential for patient to make measurable gains while on inpatient rehab is Currently, fair to poor 7. Anticipated functional outcomes upon discharge from inpatient rehab are n/a  with PT, n/a with OT, n/a with SLP. 8. Estimated rehab length of stay to reach the above functional goals is: Not applicable 9. Does the patient have adequate social supports and living environment to accommodate these discharge functional goals? Potentially 10. Anticipated D/C setting: At this point. SNF appears most appropriate 11. Anticipated post D/C treatments: [facility] therapy 12. Overall Rehab/Functional Prognosis: good and fair  RECOMMENDATIONS: This patient's condition is appropriate for continued rehabilitative care in the following setting: SNF Patient has agreed to participate in recommended program. Potentially Note that insurance prior authorization may be required for reimbursement for recommended care.  Comment: Rehabilitation admission coordinator to follow while patient hospitalized. If assist level improves to one-person assistance for mobility, may consider comprehensive, intensive inpatient rehabilitation. Family will also need to plan on post discharge 24 7 supervision,   Flora Lipps 08/18/2016

## 2016-08-18 NOTE — Progress Notes (Signed)
Noted Rehab MD's recommendation for SNF.  Spoke with admissions coordinator for CIR; she states that due to 2+ assist and not having 24/7 supervision, pt will require SNF for rehab.  She is calling daughter, [name], to update.  CSW notified of need for SNF for rehab at dc.   Reinaldo Raddle, RN, BSN  Trauma/Neuro ICU Case Manager [phone]

## 2016-08-18 NOTE — Evaluation (Signed)
Physical Therapy Evaluation Patient Details Name: Yvonne [name] MRN: [id number] DOB: 05-21-1926 Today's Date: 08/18/2016   History of Present Illness  pt presents after a fall sustaining L Radial fx s.p ORIF, L Pubic Rami fx, L Femur fx, and L ribs 3-5 fxs.  pt with hx of AAA Repair, A-fib, HTN, L THR 2/2 fx, and L2 Compression fx.    Clinical Impression  Pt very painful in L hip/pelvis during mobility, but was agreeable to OOB.  Pt has difficulty with maintaining TDWBing on L LE during transfer.  Feel pt would benefit from CIR level of therapies at D/C to maximize independence and decrease overall burden of care.      Follow Up Recommendations CIR    Equipment Recommendations  None recommended by PT    Recommendations for Other Services Rehab consult     Precautions / Restrictions Precautions Precautions: Fall Restrictions Weight Bearing Restrictions: Yes LUE Weight Bearing: Weight bear through elbow only LLE Weight Bearing: Touchdown weight bearing      Mobility  Bed Mobility Overal bed mobility: Needs Assistance Bed Mobility: Supine to Sit     Supine to sit: Max assist;+2 for physical assistance     General bed mobility comments: cues for step-by-step through bed mobility.  pt moves very slow due to pain in pelivs.    Transfers Overall transfer level: Needs assistance Equipment used: Left platform walker;2 person hand held assist Transfers: Sit to/from Stand;Stand Pivot Transfers Sit to Stand: Mod assist;+2 physical assistance Stand pivot transfers: Max assist;+2 physical assistance       General transfer comment: Initially attempted coming to standing with use of PFRW, however pt unable to maintain L LE TDWB to attempt transfer to recliner.  Came to stand a 2nd time with 2 person A and able to better A pt and complete pivot towards R side to recliner.  pt continues to have difficulty maintaining TDWBing.    Ambulation/Gait                Stairs             Wheelchair Mobility    Modified Rankin (Stroke Patients Only)       Balance Overall balance assessment: Needs assistance Sitting-balance support: Single extremity supported;Feet supported Sitting balance-Leahy Scale: Poor Sitting balance - Comments: pt leans on R UE, though question if this is more due to pain.   Postural control: Right lateral lean Standing balance support: During functional activity Standing balance-Leahy Scale: Poor                               Pertinent Vitals/Pain Pain Assessment: Faces Faces Pain Scale: Hurts even more Pain Location: L hip/pelvis Pain Descriptors / Indicators: Aching;Grimacing;Guarding Pain Intervention(s): Monitored during session;Premedicated before session;Repositioned    Home Living Family/patient expects to be discharged to:: Inpatient rehab Living Arrangements: Children               Additional Comments: pt has an apartment in the basement of her daughter's home.      Prior Function Level of Independence: Independent with assistive device(s) (with cane)               Hand Dominance        Extremity/Trunk Assessment   Upper Extremity Assessment Upper Extremity Assessment: Defer to OT evaluation    Lower Extremity Assessment Lower Extremity Assessment: Generalized weakness;LLE deficits/detail LLE Deficits / Details: PROM WFL, AROM  and strength limited by pain in hip/pelvis.  Sensation intact.   LLE: Unable to fully assess due to pain LLE Coordination: decreased fine motor;decreased gross motor    Cervical / Trunk Assessment Cervical / Trunk Assessment: Kyphotic  Communication   Communication: No difficulties  Cognition Arousal/Alertness: Awake/alert Behavior During Therapy: WFL for tasks assessed/performed Overall Cognitive Status: Within Functional Limits for tasks assessed                      General Comments      Exercises     Assessment/Plan    PT  Assessment Patient needs continued PT services  PT Problem List Decreased strength;Decreased activity tolerance;Decreased balance;Decreased mobility;Decreased coordination;Decreased knowledge of use of DME;Decreased knowledge of precautions;Pain          PT Treatment Interventions DME instruction;Gait training;Functional mobility training;Therapeutic activities;Therapeutic exercise;Balance training;Patient/family education    PT Goals (Current goals can be found in the Care Plan section)  Acute Rehab PT Goals Patient Stated Goal: To walk again. PT Goal Formulation: With patient Time For Goal Achievement: 09/01/16 Potential to Achieve Goals: Good    Frequency Min 4X/week   Barriers to discharge        Co-evaluation PT/OT/SLP Co-Evaluation/Treatment: Yes Reason for Co-Treatment: To address functional/ADL transfers PT goals addressed during session: Mobility/safety with mobility;Balance;Proper use of DME         End of Session Equipment Utilized During Treatment: Gait belt Activity Tolerance: Patient limited by pain Patient left: in chair;with call bell/phone within reach;with family/visitor present Nurse Communication: Mobility status         Time: [id number] PT Time Calculation (min) (ACUTE ONLY): 46 min   Charges:   PT Evaluation $PT Eval Moderate Complexity: 1 Procedure PT Treatments $Therapeutic Activity: 8-22 mins   PT G CodesCatarina Hartshorn, [place]  [phone] 08/18/2016, 12:11 PM

## 2016-08-18 NOTE — Progress Notes (Signed)
Patient ID: Yvonne [name], female   DOB: Sep 05, 1925, 83 y.o.   MRN: [id number]   LOS: 2 days   Subjective: Doing well, apprehensive about PT/OT today   Objective: Vital signs in last 24 hours: Temp:  [97.5 F (36.4 C)-98.7 F (37.1 C)] 98 F (36.7 C) (12/21 0610) Pulse Rate:  [70-103] 72 (12/21 0610) Resp:  [10-21] 18 (12/21 0610) BP: (107-150)/(41-62) 109/49 (12/21 0610) SpO2:  [91 %-100 %] 100 % (12/21 0610) Weight:  [56.1 kg (123 lb 9.6 oz)] 56.1 kg (123 lb 9.6 oz) (12/20 2024) Last BM Date: 08/15/16   IS: 1030ml   Physical Exam General appearance: alert and no distress Resp: clear to auscultation bilaterally Cardio: regular rate and rhythm GI: normal findings: bowel sounds normal and soft, non-tender Extremities: Left fingers numb Pulses: 2+ and symmetric   Assessment/Plan: Fall in driveway L rib FX 3-5 - pulm toilet L distal radius and ulna FX s/p ORIF - NWB per Dr. Amedeo Plenty, can WB through elbow L periprosthetic subtroch femur FX - non-op, TDWB per Dr. Rolena Infante L sup ramus FX - per Dr. Rolena Infante, non-op, no WB restriction for this Hypothyroidism - home synthroid PSVT -- Appreciate cards consult, they're to see again today FEN - SL IV VTE - PAS, Lovenox DIspo - Awaiting PT/OT consults    Lisette Abu, PA-C Pager: [phone] General Trauma PA Pager: [phone]  08/18/2016

## 2016-08-18 NOTE — Progress Notes (Signed)
  Echocardiogram 2D Echocardiogram has been performed.  Pearly Apachito L Androw 08/18/2016, 4:10 PM

## 2016-08-18 NOTE — Progress Notes (Signed)
Subjective: 1 Day Post-Op Procedure(s) (LRB): OPEN REDUCTION INTERNAL FIXATION (ORIF) WRIST FRACTURE (Left) Chart reviewed Patient reports pain as minimal in regards to the left wrist. She denies nausea, vomiting, fever or chills. She denies chest pains, shortness of breath. Her son is in the room with her this afternoon. She tolerated a regular diet today. She complains of socially pelvic pain to be expected given her fractures.   Objective: Vital signs in last 24 hours: Temp:  [97.5 F (36.4 C)-98.4 F (36.9 C)] 98 F (36.7 C) (12/21 0610) Pulse Rate:  [72-103] 72 (12/21 0610) Resp:  [10-18] 18 (12/21 0610) BP: (107-150)/(44-62) 111/45 (12/21 1109) SpO2:  [95 %-100 %] 100 % (12/21 0610) Weight:  [56.1 kg (123 lb 9.6 oz)] 56.1 kg (123 lb 9.6 oz) (12/20 2024)  Intake/Output from previous day: 12/20 0701 - 12/21 0700 In: [id number] [P.O.:300; I.V.:1195; IV Piggyback:100] Out: 639 [Urine:625; Drains:4; Blood:10] Intake/Output this shift: Total I/O In: 350 [P.O.:350] Out: -    Recent Labs  08/15/16 1944 08/16/16 0350 08/17/16 0406  HGB 11.0* 9.9* 8.5*    Recent Labs  08/16/16 0350 08/17/16 0406  WBC 8.3 6.8  RBC 3.22* 2.74*  HCT 30.8* 26.6*  PLT 96* 73*    Recent Labs  08/16/16 0350 08/17/16 0406  NA 140 138  K 4.5 4.0  CL 108 107  CO2 25 26  BUN 19 10  CREATININE 0.90 0.83  GLUCOSE 199* 151*  CALCIUM 8.4* 8.3*    Recent Labs  08/15/16 1944  INR 1.00    Focused examination of the left upper extremity shows that she has excellent digital range of motion with flexion and extension. She has no signs of increased flexion, cellulitis, compartment syndrome. The drain is removed without difficulties. She has diffuse ecchymosis and very fragile skin present about the upper extremities. Carter arm pillow is intact.  Assessment/Plan: 1 Day Post-Op Procedure(s) (LRB): OPEN REDUCTION INTERNAL FIXATION (ORIF) WRIST FRACTURE (Left) We have discussed with her  continuation of range of motion about the digits as well as massage. In regards to therapy she does have a platform walker in the room. I have discussed with her and her son we do not want weightbearing to the wrist and/or hand, no lifting or gripping twisting pushing or pulling however, she can place weight on the elbow for attempts at ambulation. Discharge disposition is pending we will continue to follow her along. We will need to follow her in our office in approximately 10-12 days for suture removal and cast application. Questions were encouraged and answered.  Yvonne [name] L 08/18/2016, 4:33 PM

## 2016-08-18 NOTE — Evaluation (Signed)
Occupational Therapy Evaluation Patient Details Name: Yvonne [name] MRN: [id number] DOB: 1926/04/09 Today's Date: 08/18/2016    History of Present Illness pt presents after a fall sustaining L Radial fx s.p ORIF, L Pubic Rami fx, L Femur fx, and L ribs 3-5 fxs.  pt with hx of AAA Repair, A-fib, HTN, L THR 2/2 fx, and L2 Compression fx.     Clinical Impression   PTA Pt independent in ADL/IADL and mobility with SPC. Pt currently max assist for LB ADL and BUE ADL, and +2 max assist for transfers. Pt very painful in L hip/pelvis during stand pivot transfer and sit <> stand, but was agreeable to OOB this session.  Pt has difficulty with maintaining TDWBing on L LE during transfer. Pt will benefit from skilled OT in the acute care setting prior to d/c to venue below. Feel pt would benefit from CIR level of therapies at D/C to maximize independence in ADL, decrease overall burden of care, and return to PLOF.     Follow Up Recommendations  CIR;Supervision/Assistance - 24 hour    Equipment Recommendations  Other (comment) (defer to next venue)    Recommendations for Other Services Rehab consult     Precautions / Restrictions Precautions Precautions: Fall Required Braces or Orthoses: Other Brace/Splint (LUE cast) Other Brace/Splint: carter arm pillow Restrictions Weight Bearing Restrictions: Yes LUE Weight Bearing: Weight bear through elbow only LLE Weight Bearing: Touchdown weight bearing      Mobility Bed Mobility Overal bed mobility: Needs Assistance Bed Mobility: Supine to Sit     Supine to sit: Max assist;+2 for physical assistance     General bed mobility comments: cues for step-by-step through bed mobility.  pt moves very slow due to pain in pelivs.    Transfers Overall transfer level: Needs assistance Equipment used: Left platform walker;2 person hand held assist Transfers: Sit to/from Stand;Stand Pivot Transfers Sit to Stand: Mod assist;+2 physical  assistance Stand pivot transfers: Max assist;+2 physical assistance       General transfer comment: Initially attempted coming to standing with use of PFRW, however pt unable to maintain L LE TDWB to attempt transfer to recliner.  Came to stand a 2nd time with 2 person A and able to better A pt and complete pivot towards R side to recliner.  pt continues to have difficulty maintaining TDWBing.      Balance Overall balance assessment: Needs assistance Sitting-balance support: Single extremity supported;Feet supported Sitting balance-Leahy Scale: Poor Sitting balance - Comments: pt leans on R UE, though question if this is more due to pain.   Postural control: Right lateral lean Standing balance support: During functional activity Standing balance-Leahy Scale: Poor                              ADL Overall ADL's : Needs assistance/impaired Eating/Feeding: Modified independent;Sitting   Grooming: Moderate assistance;Sitting   Upper Body Bathing: Moderate assistance;Sitting;Bed level   Lower Body Bathing: Maximal assistance;Bed level;Sitting/lateral leans   Upper Body Dressing : Moderate assistance;Sitting   Lower Body Dressing: Maximal assistance;Bed level;+2 for physical assistance   Toilet Transfer: +2 for physical assistance;Maximal assistance;+2 for safety/equipment;Stand-pivot;BSC Toilet Transfer Details (indicate cue type and reason): transfer to the right to allow Pt to use strong leg to assist, Pt wants to help and gives good effort, generalized weakness Toileting- Clothing Manipulation and Hygiene: Total assistance;Sit to/from stand Toileting - Clothing Manipulation Details (indicate cue type and reason):  hospital gown     Functional mobility during ADLs:  (only stand pivot and sit <> stand attempted this session) General ADL Comments: Pt limited by mobility and limited BUE tasks. Pt independent PTA. Will benefit from CIR level therapyto return to PLOF.      Vision Vision Assessment?: Vision impaired- to be further tested in functional context Additional Comments: No visual assessment performed during this session, only oral questions asked. Pt has previous glasses in the room with her, and she thinks they are the correct perscription. Pt says that her peripheral vision is blurry, and that since the fall she "sees movement" but then nothing is there.   Perception     Praxis      Pertinent Vitals/Pain Pain Assessment: Faces Faces Pain Scale: Hurts even more Pain Location: L hip/pelvis Pain Descriptors / Indicators: Aching;Grimacing;Guarding Pain Intervention(s): Monitored during session;Premedicated before session;Repositioned     Hand Dominance  (both, uses different hands for different things)   Extremity/Trunk Assessment Upper Extremity Assessment Upper Extremity Assessment: LUE deficits/detail;Generalized weakness LUE Deficits / Details: post-op deficits - in cast; nerve block still effective LUE: Unable to fully assess due to immobilization LUE Sensation:  (numb from nerve block still) LUE Coordination: decreased fine motor   Lower Extremity Assessment Lower Extremity Assessment: Defer to PT evaluation;Generalized weakness;LLE deficits/detail LLE Deficits / Details: PROM WFL, AROM and strength limited by pain in hip/pelvis.  Sensation intact.   LLE: Unable to fully assess due to pain LLE Coordination: decreased fine motor;decreased gross motor   Cervical / Trunk Assessment Cervical / Trunk Assessment: Kyphotic   Communication Communication Communication: No difficulties   Cognition Arousal/Alertness: Awake/alert Behavior During Therapy: WFL for tasks assessed/performed Overall Cognitive Status: Within Functional Limits for tasks assessed                     General Comments       Exercises       Shoulder Instructions      Home Living Family/patient expects to be discharged to:: Inpatient  rehab Living Arrangements: Children                               Additional Comments: pt has an apartment in the basement of her daughter's home.        Prior Functioning/Environment Level of Independence: Independent with assistive device(s)        Comments: Used SPC to ambulate, didn't cook much but independent in ADL/IADl        OT Problem List: Decreased strength;Decreased range of motion;Decreased activity tolerance;Impaired balance (sitting and/or standing);Impaired vision/perception;Decreased safety awareness;Decreased knowledge of use of DME or AE;Impaired UE functional use;Pain   OT Treatment/Interventions: Self-care/ADL training;Therapeutic exercise;Energy conservation;DME and/or AE instruction;Therapeutic activities;Visual/perceptual remediation/compensation;Patient/family education;Balance training    OT Goals(Current goals can be found in the care plan section) Acute Rehab OT Goals Patient Stated Goal: To walk again. OT Goal Formulation: With patient Time For Goal Achievement: 09/01/16 Potential to Achieve Goals: Good ADL Goals Pt Will Perform Grooming: with modified independence;sitting Pt Will Perform Upper Body Dressing: with modified independence;sitting Pt Will Perform Lower Body Dressing: with min guard assist;sit to/from stand Pt Will Transfer to Toilet: with supervision;ambulating;bedside commode (L platform walker) Pt Will Perform Toileting - Clothing Manipulation and hygiene: with modified independence;sit to/from stand  OT Frequency: Min 2X/week   Barriers to D/C:            Co-evaluation PT/OT/SLP  Co-Evaluation/Treatment: Yes Reason for Co-Treatment: To address functional/ADL transfers PT goals addressed during session: Mobility/safety with mobility OT goals addressed during session: ADL's and self-care      End of Session Equipment Utilized During Treatment: Gait belt;Other (comment);Oxygen (L platform walker (left in room),  carter arm pillow) Nurse Communication: Mobility status;Weight bearing status;Other (comment) (transfer status (+2, transfer to the right side))  Activity Tolerance: Patient limited by pain Patient left: in chair;with call bell/phone within reach;with family/visitor present   Time: [id number] OT Time Calculation (min): 47 min Charges:  OT General Charges $OT Visit: 1 Procedure OT Evaluation $OT Eval Moderate Complexity: 1 Procedure G-Codes:    Merri Ray Yvonne [name] 09-02-16, 1:40 PM  Hulda Humphrey OTR/L [phone]

## 2016-08-18 NOTE — Progress Notes (Addendum)
I met with pt at bedside. No family present. I explained that due to her injuries, after any short term rehab, she will need 24/7 assist at home, which is not available. We are recommending SNF rehab for prolonged recovery. I have placed a call to her daughter, [name], to discuss our recommendations. [phone] I spoke with daughter, [name], and she is aware of our recommendation of SNF and is in agreement. We will sign off. [phone]

## 2016-08-18 NOTE — Progress Notes (Signed)
Assumed care of patient.

## 2016-08-18 NOTE — Progress Notes (Signed)
Inpatient Rehabilitation  Per PT request, patient was screened by Gunnar Fusi for appropriateness for an Inpatient Acute Rehab consult.  At this time we are recommending an Inpatient Rehab consult.  PA paged to request order; please order if you are agreeable.    Carmelia Roller., CCC/SLP Admission Coordinator  [facility]  Cell [phone]

## 2016-08-18 NOTE — Op Note (Signed)
NAMEBYRDIE, VOEGELE              ACCOUNT NO.:  [id number]  MEDICAL RECORD NO.:  [id number]  LOCATION:  E38C                         FACILITY:  [facility]  PHYSICIAN:  Satira Anis. Kelsie Kramp, M.D.DATE OF BIRTH:  Nov 14, 1925  DATE OF PROCEDURE:08/17/2016 DATE OF DISCHARGE:TBD                              OPERATIVE REPORT   PREOPERATIVE DIAGNOSES:  Comminuted complex intra-articular distal radius fracture greater than 7 part, left upper extremity, with skin tears, dorsal ulnarly about the wrist.  POSTOPERATIVE DIAGNOSES:  Comminuted complex intra-articular distal radius fracture greater than 7 part, left upper extremity, with skin tears, dorsal ulnarly about the wrist.  PROCEDURE: 1. Open reduction and internal fixation greater than 7 part articular     distal radius fracture with a volar rim DVR plate and screw     construct as well as 5 mL of StaGraft. 2. Four view x-ray series. 3. Closed manipulation distal ulnar shaft fracture in the very distal     portion. 4. I and D and repair of 3 cm skin tear dorsal ulnar hand. 5. Excisional debridement with knife, curette and scissors.  SURGEON:  Satira Anis. Amedeo Plenty, MD  ASSISTANT:  Avelina Laine, PA-C.  COMPLICATIONS:  None.  ANESTHESIA:  Block with IV sedation.  TOURNIQUET TIME:  Less than 90 minutes.  DRAINS:  One #7 TLS drain.  INDICATIONS:  This pleasant female presents with the above-mentioned diagnosis.  She is a healthy active lady but has multiple medical problems and is 82 years of age.  Given her independent status, we elected to proceed with ORIF of the wrist as outlined to the family. Risks and benefits were discussed.  Unfortunately, this is so significantly comminuted and in such disarray that I feel that ORIF certainly worthy an attempt.  OPERATIVE PROCEDURE:  The patient seen by myself and Anesthesia, taken to operative suite, underwent smooth induction of IV sedation, preoperative block looked excellent.  Two  Hibiclens, prescrub was by myself, followed by Betadine scrub and paint by Mr. Avelina Laine, PA-C was accomplished.  Arm was very gingerly held at all times.  Sterile field was secured.  Time-out was called.  Tourniquet was insufflated. Volar radial approach to the wrist was made.  Dissection was carried down.  FCR tendon sheath was incised dorsally and palmarly.  Following this, carpal canal contents were retracted ulnarly.  Pronator was incised.  The fracture was accessed.  We then reassembled the fracture with provisional fixation and following this, StaGraft 5 mL was placed in the metaphyseal void, followed by re-placing the shaft.  She had a prior fracture with deformity here and that is where they pieced together the malunion to some extent back to where it was prior to her fracture most recently.  I made a little bit of a slot for that plate, so that it would not kick off the end of bone with rongeur and then applied an extended volar rim plate.  I got the screws very perfectly purchased in my opinion without difficulty and this allowed for sound stable construct.  Radial height, inclination and volar tilt were back to their preoperative parameters in my estimation.  She had some loss of a radial inclination which was part  of the malunion process previously noted.  Following this, irrigated copiously, closed the pronator.  I was pleased with the radial height, the restoration of volar tilt and the inclination back to its usual confines.  Once pronator was closed and final copy AP lateral 4 views x-ray series were checked, we then turned attention toward the ulna.  This was evaluated multiple times during the course of the procedure but certainly it was pleasing to see this stable with distal radioulnar joint  mechanics without complicating feature. Thus we performed gentle closed manipulation to try to spear it and stopped at that point.  Following this, I performed I and D of  the dorsal hand and repaired with chromic suture of the skin tear 3 cm roughly.  The patient tolerated this well.  This was an I and D with skin and subcutaneous tissue with scissor, knife, blade, and curette.  The patient tolerated this well. There were no complicating features.  She was then dressed with Neosporin, Adaptic, Xeroform, and a sugar-tong splint without difficulty.  We very carefully held her and made sure we did not over tighten the dressing.  She tolerated the procedure well. She will be continued on inpatient status for Trauma Surgery.  She will notify us should any problems occur.  Otherwise we will see her throughout her hospitalization and make sure things go smoothly.  Do's and Don't's discussed and all questions have been encouraged and answered.  I will allow her some very gentle ginger weightbearing to her elbow and absolutely none through her wrist, in terms of the touchdown weightbearing status for her lower extremities.     Satira Anis. Amedeo Plenty, M.D.     [facility]  D:  08/17/2016  T:  08/18/2016  Job:  [id number]

## 2016-08-18 NOTE — Progress Notes (Signed)
Subjective:  S/P L humorous ORIF. Clinically stable. A & O. VSS NSR  Objective:  Temp:  [97.5 F (36.4 C)-98.7 F (37.1 C)] 98 F (36.7 C) (12/21 0610) Pulse Rate:  [70-103] 72 (12/21 0610) Resp:  [10-21] 18 (12/21 0610) BP: (107-150)/(41-62) 109/49 (12/21 0610) SpO2:  [91 %-100 %] 100 % (12/21 0610) Weight:  [123 lb 9.6 oz (56.1 kg)] 123 lb 9.6 oz (56.1 kg) (12/20 2024) Weight change:   Intake/Output from previous day: 12/20 0701 - 12/21 0700 In: 8250 [P.O.:300; I.V.:1195; IV Piggyback:100] Out: 639 [Urine:625; Drains:4; Blood:10]  Intake/Output from this shift: No intake/output data recorded.  Physical Exam: General appearance: alert and no distress Neck: no adenopathy, no carotid bruit, no JVD, supple, symmetrical, trachea midline and thyroid not enlarged, symmetric, no tenderness/mass/nodules Lungs: clear to auscultation bilaterally Heart: regular rate and rhythm, S1, S2 normal, no murmur, click, rub or gallop Extremities: extremities normal, atraumatic, no cyanosis or edema  Lab Results: Results for orders placed or performed during the hospital encounter of 08/15/16 (from the past 48 hour(s))  CBC     Status: Abnormal   Collection Time: 08/17/16  4:06 AM  Result Value Ref Range   WBC 6.8 4.0 - 10.5 K/uL   RBC 2.74 (L) 3.87 - 5.11 MIL/uL   Hemoglobin 8.5 (L) 12.0 - 15.0 g/dL   HCT 26.6 (L) 36.0 - 46.0 %   MCV 97.1 78.0 - 100.0 fL   MCH 31.0 26.0 - 34.0 pg   MCHC 32.0 30.0 - 36.0 g/dL   RDW 14.7 11.5 - 15.5 %   Platelets 73 (L) 150 - 400 K/uL    Comment: REPEATED TO VERIFY CONSISTENT WITH PREVIOUS RESULT   Basic metabolic panel     Status: Abnormal   Collection Time: 08/17/16  4:06 AM  Result Value Ref Range   Sodium 138 135 - 145 mmol/L   Potassium 4.0 3.5 - 5.1 mmol/L   Chloride 107 101 - 111 mmol/L   CO2 26 22 - 32 mmol/L   Glucose, Bld 151 (H) 65 - 99 mg/dL   BUN 10 6 - 20 mg/dL   Creatinine, Ser 0.83 0.44 - 1.00 mg/dL   Calcium 8.3 (L) 8.9 -  10.3 mg/dL   GFR calc non Af Amer >60 >60 mL/min   GFR calc Af Amer >60 >60 mL/min    Comment: (NOTE) The eGFR has been calculated using the CKD EPI equation. This calculation has not been validated in all clinical situations. eGFR's persistently <60 mL/min signify possible Chronic Kidney Disease.    Anion gap 5 5 - 15    Imaging: Imaging results have been reviewed  Tele- NSR  Assessment/Plan:   1. Active Problems: 2.   Fall 3.   NSVT (nonsustained ventricular tachycardia) ([place]) 4.   Fracture of multiple ribs of left side 5.   Left wrist fracture 6.   Closed left subtrochanteric femur fracture ([facility]) 7.   Closed fracture of left superior pubic ramus ([facility]) 8.   Time Spent Directly with Patient:  15 minutes  Length of Stay:  LOS: 2 days   POD # 1 L humorous ORIF. Cardiac stable. NSR. This appears to have been a mechanical fall. Doubt syncope. Not worried about the short burst of NSVT. Exam benign. Pt just recently saw her cardiologist, Dr Sallyanne Kuster, in the office last month and was set up with a 6 moth ROV which she can keep. No clinical reason to see sooner. Will sign off.  Quay Burow 08/18/2016, 9:17 AM

## 2016-08-19 LAB — TYPE AND SCREEN
BLOOD PRODUCT EXPIRATION DATE: 201712282359
Blood Product Expiration Date: 201712282359
ISSUE DATE / TIME: 201712181824
ISSUE DATE / TIME: 201712181832
UNIT TYPE AND RH: 6200
Unit Type and Rh: 6200

## 2016-08-19 NOTE — Progress Notes (Signed)
Subjective: 2 Days Post-Op Procedure(s) (LRB): OPEN REDUCTION INTERNAL FIXATION (ORIF) WRIST FRACTURE (Left) Patient   Objective: The patient states that she is sore all over today. She feels as though the splint is slightly tighter about the left upper extremity. She states she was unable to participate in therapy today. Her family accompanies her in the room today. She denies any nausea, vomiting, fever or chills.   Vital signs in last 24 hours: Temp:  [98.1 F (36.7 C)-98.7 F (37.1 C)] 98.1 F (36.7 C) (12/22 0550) Pulse Rate:  [73-80] 73 (12/22 0550) Resp:  [16-18] 16 (12/22 0550) BP: (105-158)/(36-60) 108/36 (12/22 0944) SpO2:  [93 %-96 %] 93 % (12/22 0944)  Intake/Output from previous day: 12/21 0701 - 12/22 0700 In: 640 [P.O.:590; IV Piggyback:50] Out: -  Intake/Output this shift: Total I/O In: 240 [P.O.:240] Out: -    Recent Labs  08/17/16 0406  HGB 8.5*    Recent Labs  08/17/16 0406  WBC 6.8  RBC 2.74*  HCT 26.6*  PLT 73*    Recent Labs  08/17/16 0406  NA 138  K 4.0  CL 107  CO2 26  BUN 10  CREATININE 0.83  GLUCOSE 151*  CALCIUM 8.3*   No results for input(s): LABPT, INR in the last 72 hours.  Evaluation of the left upper extremity shows that her splint is intact she has had some mild migration of the splint material distally to the fingers and thus I have trimmed this and rewrapped the distal portion. She does not have any worrisome edema of the digits she has diffuse ecchymosis present. Gross sensation is intact refill is intact and she has excellent range of motion present. No signs of infection, dystrophy or compartment syndrome is present. Splint is not overly compressive. She is appropriately elevating the upper extremity.   Assessment/Plan: 2 Days Post-Op Procedure(s) (LRB): OPEN REDUCTION INTERNAL FIXATION (ORIF) WRIST FRACTURE (Left) We have discussed all issues with she and her family today. We have discussed with her that we will  need to follow up with her in our office setting in approximately 10-12 days for removal of her splint, suture removal, repeat radiographs a wound check and application of a sugar tonged cast. Tentatively she will be discharged tomorrow to SNF. Have discussed all discharge issues pertinent to the upper extremity with the family. Once again she can be weightbearing through the elbow of the left upper extremity with a platform walker and with attempts at ambulation however no weightbearing to the wrist and/or hand. Questions were encouraged and answered.  Violeta Lecount L 08/19/2016, 2:19 PM

## 2016-08-19 NOTE — Progress Notes (Signed)
CSW continues to follow with pt/family for likely SNF discharge to [facility] tomorrow.    Reinaldo Raddle, RN, BSN  Trauma/Neuro ICU Case Manager [phone]

## 2016-08-19 NOTE — NC FL2 (Signed)
[place] LEVEL OF CARE SCREENING TOOL     IDENTIFICATION  Patient Name: Yvonne [name] Birthdate: Jan 14, 1926 Sex: female Admission Date (Current Location): 08/15/2016  [facility] and [place] Number:  Herbalist and Address:  [facility] [place]. [facility], [place] [address], [place], [place] [address]      Provider Number: [id number]  Attending Physician Name and Address:  Trauma Md, MD  Relative Name and Phone Number:       Current Level of Care: Hospital Recommended Level of Care: [place] Prior Approval Number:    Date Approved/Denied:   PASRR Number:   [id number]  Discharge Plan: SNF    Current Diagnoses: Patient Active Problem List   Diagnosis Date Noted  . Fracture of multiple ribs of left side 08/18/2016  . Left wrist fracture 08/18/2016  . Closed left subtrochanteric femur fracture ([place]) 08/18/2016  . Closed fracture of left superior pubic ramus ([place]) 08/18/2016  . NSVT (nonsustained ventricular tachycardia) ([place])   . Fall 08/16/2016  . Physical exam 08/08/2016  . MVP (mitral valve prolapse) 06/24/2016  . Upper airway cough syndrome 03/07/2016  . Shortness of breath 02/23/2016  . Other fatigue 02/23/2016  . Compression fracture of thoracic vertebra ([place]) 01/22/2016  . Traumatic compression fracture of T7 thoracic vertebra ([place]) 01/13/2016  . Abdominal pain, epigastric 12/28/2015  . Osteoporosis 12/28/2015  . Surgery, elective 12/09/2015  . Compression fracture of L5 lumbar vertebra ([place]) 12/09/2015  . Second degree AV block 06/26/2015  . Aortic insufficiency 05/25/2014  . Compression fracture of body of thoracic vertebra (HCC) 04/01/2014  . Compression fracture of L4 lumbar vertebra (HCC) 04/01/2014  . Orthostatic hypotension 12/19/2013  . Syncope 12/19/2013  . AV block, 1st degree 12/19/2013  . Malnourished ([place]) 12/19/2013  . Hx: UTI (urinary tract infection) 12/19/2013  . Systolic murmur [id number]  .  Pelvic fracture ([place]) 10/25/2012  . Thoracic aortic aneurysm ([place])   . History of PSVT (paroxysmal supraventricular tachycardia)   . Compression fracture of L2 lumbar vertebra (HCC)   . Afib ([place])   . Volume overload   . SVT (supraventricular tachycardia) ([place]) 09/05/2010  . Hypothyroidism 09/02/2010  . Essential hypertension 09/02/2010    Orientation RESPIRATION BLADDER Height & Weight     Self, Situation, Place, Time  O2 (2L) Continent Weight: 123 lb 9.6 oz (56.1 kg) Height:  5\' 6"  (167.6 cm)  BEHAVIORAL SYMPTOMS/MOOD NEUROLOGICAL BOWEL NUTRITION STATUS      Continent Diet (Regular Diet/SEE DC summary)  AMBULATORY STATUS COMMUNICATION OF NEEDS Skin   Extensive Assist Verbally Surgical wounds, Skin abrasions, Bruising (secondary to fall)                       Personal Care Assistance Level of Assistance  Bathing, Feeding, Dressing Bathing Assistance: Limited assistance Feeding assistance: Limited assistance Dressing Assistance: Limited assistance     Functional Limitations Info  Sight, Hearing, Speech Sight Info: Adequate Hearing Info: Adequate Speech Info: Adequate    SPECIAL CARE FACTORS FREQUENCY  PT (By licensed PT), OT (By licensed OT)     PT Frequency: 5x a week OT Frequency: 5x a week            Contractures Contractures Info: Not present    Additional Factors Info  Code Status, Allergies Code Status Info: Full Code Allergies Info: NKA           Current Medications (08/19/2016):  This is the current hospital active medication  list Current Facility-Administered Medications  Medication Dose Route Frequency Provider Last Rate Last Dose  . amLODipine (NORVASC) tablet 5 mg  5 mg Oral Daily Ralene Ok, MD   5 mg at 08/18/16 1110  . ceFAZolin (ANCEF) IVPB 1 g/50 mL premix  1 g Intravenous Q8H Cecilio Asper Batchelder, RPH   1 g at 08/19/16 0557  . docusate sodium (COLACE) capsule 100 mg  100 mg Oral BID Lisette Abu, PA-C   100 mg at 08/18/16  2103  . flecainide (TAMBOCOR) tablet 25 mg  25 mg Oral Q12H Ralene Ok, MD   25 mg at 08/18/16 2104  . HYDROcodone-acetaminophen (NORCO/VICODIN) 5-325 MG per tablet 0.5-2 tablet  0.5-2 tablet Oral Q4H PRN Lisette Abu, PA-C      . HYDROmorphone (DILAUDID) injection 0.5 mg  0.5 mg Intravenous Q4H PRN Lisette Abu, PA-C   0.5 mg at 08/19/16 0010  . levothyroxine (SYNTHROID, LEVOTHROID) tablet 50 mcg  50 mcg Oral QAC breakfast Ralene Ok, MD   50 mcg at 08/18/16 1116  . losartan (COZAAR) tablet 25 mg  25 mg Oral QHS Ralene Ok, MD   25 mg at 08/18/16 2109  . losartan (COZAAR) tablet 50 mg  50 mg Oral Daily Ralene Ok, MD   50 mg at 08/18/16 1110  . MEDLINE mouth rinse  15 mL Mouth Rinse BID Georganna Skeans, MD   15 mL at 08/18/16 2108  . ondansetron (ZOFRAN) tablet 4 mg  4 mg Oral Q6H PRN Ralene Ok, MD       Or  . ondansetron [facility]) injection 4 mg  4 mg Intravenous Q6H PRN Ralene Ok, MD   4 mg at 08/17/16 1525  . polyethylene glycol (MIRALAX / GLYCOLAX) packet 17 g  17 g Oral Daily Lisette Abu, PA-C   17 g at 08/18/16 1109  . traMADol (ULTRAM) tablet 50 mg  50 mg Oral Q6H Georganna Skeans, MD   50 mg at 08/19/16 [id number]     Discharge Medications: Please see discharge summary for a list of discharge medications.  Relevant Imaging Results:  Relevant Lab Results:   Additional Information SSN:  [id number]  Lilly Cove, [place]

## 2016-08-19 NOTE — Anesthesia Postprocedure Evaluation (Signed)
Anesthesia Post Note  Patient: Yvonne [name]  Procedure(s) Performed: Procedure(s) (LRB): OPEN REDUCTION INTERNAL FIXATION (ORIF) WRIST FRACTURE (Left)  Patient location during evaluation: PACU Anesthesia Type: MAC and Regional Level of consciousness: awake and alert Pain management: pain level controlled Vital Signs Assessment: post-procedure vital signs reviewed and stable Respiratory status: spontaneous breathing, nonlabored ventilation, respiratory function stable and patient connected to nasal cannula oxygen Cardiovascular status: stable and blood pressure returned to baseline Anesthetic complications: no       Last Vitals:  Vitals:   08/19/16 0550 08/19/16 0944  BP: (!) 127/47 (!) 108/36  Pulse: 73   Resp: 16   Temp: 36.7 C     Last Pain:  Vitals:   08/19/16 0550  TempSrc: Oral  PainSc:                  [place]

## 2016-08-19 NOTE — Plan of Care (Signed)
Problem: Pain Managment: Goal: General experience of comfort will improve Outcome: Progressing Current pain control regimen has patients pain under control

## 2016-08-19 NOTE — Progress Notes (Signed)
Pt refused to be repositioned at this time. Also denies pain.

## 2016-08-19 NOTE — Care Management Important Message (Signed)
Important Message  Patient Details  Name: Yvonne [name] MRN: [id number] Date of Birth: [birth year]   Medicare Important Message Given:  Yes    [name] 08/19/2016, 11:06 AM

## 2016-08-19 NOTE — Clinical Social Work Placement (Signed)
   CLINICAL SOCIAL WORK PLACEMENT  NOTE  Date:  08/19/2016  Patient Details  Name: Yvonne [name] MRN: [id number] Date of Birth: Nov 25, 1925  Clinical Social Work is seeking post-discharge placement for this patient at the [place] level of care (*CSW will initial, date and re-position this form in  chart as items are completed):  Yes   Patient/family provided with [place] Work Department's list of facilities offering this level of care within the geographic area requested by the patient (or if unable, by the patient's family).  Yes   Patient/family informed of their freedom to choose among providers that offer the needed level of care, that participate in Medicare, Medicaid or managed care program needed by the patient, have an available bed and are willing to accept the patient.  Yes   Patient/family informed of 's ownership interest in [facility] and [facility], as well as of the fact that they are under no obligation to receive care at these facilities.  PASRR submitted to EDS on       PASRR number received on       Existing PASRR number confirmed on 08/19/16     FL2 transmitted to all facilities in geographic area requested by pt/family on 08/19/16     FL2 transmitted to all facilities within larger geographic area on       Patient informed that his/her managed care company has contracts with or will negotiate with certain facilities, including the following:            Patient/family informed of bed offers received.  Patient chooses bed at       Physician recommends and patient chooses bed at      Patient to be transferred to   on  .  Patient to be transferred to facility by       Patient family notified on   of transfer.  Name of family member notified:        PHYSICIAN Please sign FL2     Additional Comment:    _______________________________________________ Lilly Cove, LCSW 08/19/2016, 9:29  AM

## 2016-08-19 NOTE — Progress Notes (Signed)
Bed at [facility] for admission on Saturday. Daughter [name] will complete paperwork today at 4pm.  Weekend SW to facilitate and complete DC.  Lane Hacker, MSW Clinical Social Work: Printmaker Coverage for :  [phone]

## 2016-08-19 NOTE — Progress Notes (Signed)
Patient ID: Yvonne [name], female   DOB: 1926/08/14, 82 y.o.   MRN: [id number]   LOS: 3 days   Subjective: NSC, LUE splint feels a bit tighter   Objective: Vital signs in last 24 hours: Temp:  [98.1 F (36.7 C)-98.7 F (37.1 C)] 98.1 F (36.7 C) (12/22 0550) Pulse Rate:  [73-80] 73 (12/22 0550) Resp:  [16-18] 16 (12/22 0550) BP: (105-158)/(38-60) 127/47 (12/22 0550) SpO2:  [95 %-96 %] 96 % (12/22 0550) Last BM Date: 08/15/16   Physical Exam General appearance: alert and no distress Resp: clear to auscultation bilaterally Cardio: regular rate and rhythm GI: normal findings: bowel sounds normal and soft, non-tender Extremities: Sensation intact Pulses: 2+ and symmetric   Assessment/Plan: Fall in driveway L rib FX 3-5- pulm toilet L distal radius and ulna FX s/p ORIF- NWB per Dr. Amedeo Plenty, can WB through elbow L periprosthetic subtroch femur FX- non-op, TDWB per Dr. Rolena Infante L sup ramus FX- per Dr. Rolena Infante, non-op, no WB restriction for this Hypothyroidism- home synthroid PSVT -- Appreciate cards consult, they've signed off FEN- SL IV VTE- PAS, Lovenox DIspo- SNF when bed available    Lisette Abu, PA-C Pager: [phone] General Trauma PA Pager: [phone]  08/19/2016

## 2016-08-19 NOTE — Clinical Social Work Note (Signed)
Clinical Social Work Assessment  Patient Details  Name: Yvonne [name] MRN: [id number] Date of Birth: June 12, 1926  Date of referral:  08/19/16               Reason for consult:  Facility Placement, Discharge Planning                Permission sought to share information with:  Case Manager, Customer service manager, Family Supports Permission granted to share information::  Yes, Verbal Permission Granted  Name::        Agency::  SNF HUB  Relationship::  [name]: daughter  Sport and exercise psychologist Information:     Housing/Transportation Living arrangements for the past 2 months:  [place] of Information:  Patient, Medical Team, Case Manager, Adult Children Patient Interpreter Needed:  None Criminal Activity/Legal Involvement Pertinent to Current Situation/Hospitalization:  No - Comment as needed Significant Relationships:  Adult Children, Other Family Members Lives with:  Adult Children Do you feel safe going back to the place where you live?  No Need for family participation in patient care:  Yes (Comment)  Care giving concerns:  Patient lives with daughter full time and at this time, patient requires more assistance and time for rehab than daughter can provide.  Agreeable for ST rehab due to CIR feeling she may need more time in rehab than CIR can offer.  Patient was independent prior to admission, had a fall requiring rehab/admission.   Social Worker assessment / plan:  LCSW received call from CIR and CM due to denial of CIR.  SNF has been worked up and patient has been faxed out awaiting referrals for SNF.  Family and patient agreeable to SNF. Bed offer for [facility] was presented to daughter and patient. Both agreeable. DC for Saturday.   Employment status:  Retired Nurse, adult PT Recommendations:  Inpatient [place], [place] / Referral to community resources:  [place]  Patient/Family's  Response to care:  Agreeable for plan  Patient/Family's Understanding of and Emotional Response to Diagnosis, Current Treatment, and Prognosis:  Daughter very concerned that patient is being rushed out of hospital and her pain is not controlled.    Emotional Assessment Appearance:  Appears stated age Attitude/Demeanor/Rapport:    Affect (typically observed):  Accepting, Adaptable Orientation:  Oriented to Self, Oriented to Place, Oriented to Situation Alcohol / Substance use:  Not Applicable Psych involvement (Current and /or in the community):  No (Comment)  Discharge Needs  Concerns to be addressed:  No discharge needs identified Readmission within the last 30 days:  No Current discharge risk:  None Barriers to Discharge:  No Barriers Identified, Continued Medical Work up   Lilly Cove, LCSW 08/19/2016, 9:32 AM

## 2016-08-19 NOTE — Progress Notes (Signed)
Occupational Therapy Treatment Patient Details Name: Yvonne [name] MRN: [id number] DOB: 11-15-1925 Today's Date: 08/19/2016    History of present illness pt presents after a fall sustaining L Radial fx s.p ORIF, L Pubic Rami fx, L Femur fx, and L ribs 3-5 fxs.  pt with hx of AAA Repair, A-fib, HTN, L THR 2/2 fx, and L2 Compression fx.     OT comments  Pt progressing toward OT goals but limited by pain this session. Pt able to tolerate sitting at EOB with single UE for simulated ADL this session for approximately 10 minutes. Attempted multiple sit<>stand transfers and pt able to boost into standing on fifth attempt. Unable to complete stand-pivot transfer while adhering to TDWB status of LLE this session due to pain and fatigue. Instructed pt on L shoulder AAROM and digit AROM for HEP. Due to pt's current activity tolerance for therapy and inability to have 24 hour assistance from family, pt would be a good candidate for continued OT services through short-term SNF placement in order to improve independence with ADL and functional mobility in order to facilitate return to PLOF. Updated D/C recommendations accordingly.   Follow Up Recommendations  Supervision/Assistance - 24 hour;SNF    Equipment Recommendations  None recommended by OT (Defer to next venue of care)    Recommendations for Other Services      Precautions / Restrictions Precautions Precautions: Fall Required Braces or Orthoses: Other Brace/Splint Other Brace/Splint: carter arm pillow Restrictions Weight Bearing Restrictions: Yes LUE Weight Bearing: Weight bear through elbow only LLE Weight Bearing: Touchdown weight bearing       Mobility Bed Mobility Overal bed mobility: Needs Assistance Bed Mobility: Supine to Sit     Supine to sit: Max assist;+2 for physical assistance     General bed mobility comments: required +2 max assist for LE mobility and trunk upright. Verbal cues for sequencing throughout.    Transfers Overall transfer level: Needs assistance Equipment used: Left platform walker;2 person hand held assist Transfers: Sit to/from Stand Sit to Stand: +2 physical assistance;Mod assist         General transfer comment: Attempted to come to standing with PFRW x5. Pt successful with one attempt to stand 5-10 seconds but needed to sit 2/2 pain. +2 mod assist to power to stand and maintain balance once standing.     Balance Overall balance assessment: Needs assistance Sitting-balance support: Feet supported;Single extremity supported Sitting balance-Leahy Scale: Fair Sitting balance - Comments: pt leans on R UE, though question if this is more due to pain.   Postural control: Right lateral lean Standing balance support: Single extremity supported;During functional activity Standing balance-Leahy Scale: Poor Standing balance comment: Pt requires mod assist +2 to maintain standing balance with PFRW                   ADL Overall ADL's : Needs assistance/impaired     Grooming: Sitting;Moderate assistance                     Toilet Transfer Details (indicate cue type and reason): Attempted with max assist +2 x3. Pt unable to come fully to standing position due to pain and weakness.  Toileting- Clothing Manipulation and Hygiene: Total assistance;Sit to/from stand         General ADL Comments: Pt motivated to participate but limited by pain this session.      Vision  Additional Comments: Plan to continue vision assessment next session.   Perception     Praxis      Cognition   Behavior During Therapy: WFL for tasks assessed/performed Overall Cognitive Status: Within Functional Limits for tasks assessed                       Extremity/Trunk Assessment               Exercises General Exercises - Upper Extremity Shoulder Flexion: Left;Seated;5 reps;AAROM (AAROM due to heaviness of splint) Digit Composite Flexion:  AROM;10 reps;Seated   Shoulder Instructions       General Comments      Pertinent Vitals/ Pain       Pain Assessment: 0-10 Pain Score: 8  Pain Location: L hip/pelvis Pain Descriptors / Indicators: Aching;Grimacing;Guarding Pain Intervention(s): Monitored during session;Limited activity within patient's tolerance;Repositioned;Patient requesting pain meds-RN notified  Home Living                                          Prior Functioning/Environment              Frequency  Min 2X/week        Progress Toward Goals  OT Goals(current goals can now be found in the care plan section)  Progress towards OT goals: Progressing toward goals  Acute Rehab OT Goals Patient Stated Goal: To walk again. OT Goal Formulation: With patient Time For Goal Achievement: 09/01/16 Potential to Achieve Goals: Good ADL Goals Pt Will Perform Grooming: with modified independence;sitting Pt Will Perform Upper Body Dressing: with modified independence;sitting Pt Will Perform Lower Body Dressing: with min guard assist;sit to/from stand Pt Will Transfer to Toilet: with supervision;ambulating;bedside commode (L platform RW) Pt Will Perform Toileting - Clothing Manipulation and hygiene: with modified independence;sit to/from stand  Plan Discharge plan needs to be updated    Co-evaluation    PT/OT/SLP Co-Evaluation/Treatment: Yes Reason for Co-Treatment: Complexity of the patient's impairments (multi-system involvement);For patient/therapist safety;To address functional/ADL transfers PT goals addressed during session: Mobility/safety with mobility;Balance;Proper use of DME;Strengthening/ROM OT goals addressed during session: ADL's and self-care      End of Session Equipment Utilized During Treatment: Gait belt (L platform RW)   Activity Tolerance Patient limited by pain   Patient Left in bed;with call bell/phone within reach;with family/visitor present   Nurse  Communication Mobility status;Patient requests pain meds        Time: [id number] OT Time Calculation (min): 40 min  Charges: OT General Charges $OT Visit: 1 Procedure OT Treatments $Self Care/Home Management : 8-22 mins  Norman Herrlich, OTR/L [phone] 08/19/2016, 1:44 PM

## 2016-08-19 NOTE — Progress Notes (Signed)
Physical Therapy Treatment Patient Details Name: Yvonne [name] MRN: [id number] DOB: 1925-12-24 Today's Date: 08/19/2016    History of Present Illness (P) pt presents after a fall sustaining L Radial fx s.p ORIF, L Pubic Rami fx, L Femur fx, and L ribs 3-5 fxs.  pt with hx of AAA Repair, A-fib, HTN, L THR 2/2 fx, and L2 Compression fx.      PT Comments    Pt limited by pain. Pt unable to transfer to [facility] today despite x5 attempts. Pt successful with sit<>stand 1/5 attempts and able to maintain for 5-10 seconds before requesting seated rest break. Required +2 mod assist as 1 therapist assisted to maintain NWB status of LUE with PFRW and second therapist to assist with power to stand. Pt is a good candidate for SNF following d/c from hospital to address deficits in mobility and activity tolerance before return to home. PT will continue to follow acutely.  Follow Up Recommendations  SNF     Equipment Recommendations  None recommended by PT    Recommendations for Other Services       Precautions / Restrictions Precautions Precautions: (P) Fall Required Braces or Orthoses: (P) Other Brace/Splint Other Brace/Splint: (P) carter arm pillow Restrictions Weight Bearing Restrictions: Yes LUE Weight Bearing: Weight bear through elbow only LLE Weight Bearing: Touchdown weight bearing    Mobility  Bed Mobility Overal bed mobility: (P) Needs Assistance Bed Mobility: (P) Supine to Sit     Supine to sit: (P) Max assist;+2 for physical assistance     General bed mobility comments: (P) required +2 max assist for LE mobility and trunk upright. Verbal cues for sequencing throughout.   Transfers Overall transfer level: Needs assistance Equipment used: Left platform walker;2 person hand held assist Transfers: Sit to/from Stand Sit to Stand: Mod assist;+2 physical assistance         General transfer comment: Attempted to come to standing with PFRW x5. Pt successful with one attempt to  stand 5-10 seconds but needed to sit 2/2 pain. +2 mod assist to power to stand and maintain balance once standing.   Ambulation/Gait             General Gait Details: NT- pt unable to demonstrate at this time   Stairs            Wheelchair Mobility    Modified Rankin (Stroke Patients Only)       Balance Overall balance assessment: (P) Needs assistance Sitting-balance support: Feet supported;Single extremity supported (Therapist holding LUE throughout. RUE supported with PFRW) Sitting balance-Leahy Scale: Fair     Standing balance support: Single extremity supported;During functional activity Standing balance-Leahy Scale: Poor Standing balance comment: Pt requires mod assist +2 to maintain standing balance with PFRW                    Cognition Arousal/Alertness: (P) Awake/alert Behavior During Therapy: (P) WFL for tasks assessed/performed Overall Cognitive Status: (P) Within Functional Limits for tasks assessed                      Exercises      General Comments        Pertinent Vitals/Pain Pain Assessment: (P) 0-10 Pain Score: (P) 8  Pain Location: (P) L hip/pelvis Pain Descriptors / Indicators: (P) Aching;Grimacing;Guarding Pain Intervention(s): (P) Monitored during session;Limited activity within patient's tolerance;Repositioned;Patient requesting pain meds-RN notified    Home Living  Prior Function            PT Goals (current goals can now be found in the care plan section) Acute Rehab PT Goals Patient Stated Goal: To walk again. PT Goal Formulation: With patient Time For Goal Achievement: 09/01/16 Potential to Achieve Goals: Good Progress towards PT goals: Progressing toward goals    Frequency    Min 4X/week      PT Plan Discharge plan needs to be updated    Co-evaluation PT/OT/SLP Co-Evaluation/Treatment: Yes Reason for Co-Treatment: (P) Complexity of the patient's impairments  (multi-system involvement);For patient/therapist safety;To address functional/ADL transfers PT goals addressed during session: Mobility/safety with mobility;Balance;Proper use of DME;Strengthening/ROM OT goals addressed during session: (P) ADL's and self-care     End of Session Equipment Utilized During Treatment: Gait belt Activity Tolerance: Patient limited by pain Patient left: in bed;with call bell/phone within reach;with family/visitor present     Time: [id number] PT Time Calculation (min) (ACUTE ONLY): 39 min  Charges:  $Therapeutic Activity: 23-37 mins                    G Codes:      Tonia Brooms 09/18/16, 1:39 PM Tonia Brooms, SPT [phone]

## 2016-08-20 MED ORDER — HYDROCODONE-ACETAMINOPHEN 5-325 MG PO TABS: 0.5000 | ORAL_TABLET | ORAL | 0 refills | Status: DC | PRN

## 2016-08-20 MED ORDER — TRAMADOL HCL 50 MG PO TABS: 50.0000 mg | ORAL_TABLET | Freq: Four times a day (QID) | ORAL | 0 refills | Status: DC

## 2016-08-20 NOTE — Progress Notes (Signed)
Physical Therapy Treatment Patient Details Name: MELISSE FLECKER MRN: [id number] DOB: 1926-02-23 Today's Date: 08/20/2016    History of Present Illness pt presents after a fall sustaining L Radial fx s.p ORIF, L Pubic Rami fx, L Femur fx, and L ribs 3-5 fxs.  pt with hx of AAA Repair, A-fib, HTN, L THR 2/2 fx, and L2 Compression fx.      PT Comments    Pt admitted with above diagnosis. Pt currently with functional limitations due to balance and endurance deficits. Pt did much better with stand pivot transfers with PFRW today.  Pt is progressing although this PT feels it will be slow progress.  Plan to d/c to SNF today.  Pt will benefit from skilled PT to increase their independence and safety with mobility to allow discharge to the venue listed below.    Follow Up Recommendations  SNF     Equipment Recommendations  None recommended by PT    Recommendations for Other Services       Precautions / Restrictions Precautions Precautions: Fall Required Braces or Orthoses: Other Brace/Splint Other Brace/Splint: carter arm pillow Restrictions Weight Bearing Restrictions: Yes LUE Weight Bearing: Weight bear through elbow only LLE Weight Bearing: Touchdown weight bearing    Mobility  Bed Mobility Overal bed mobility: Needs Assistance Bed Mobility: Supine to Sit     Supine to sit: +2 for physical assistance;Mod assist     General bed mobility comments: required +2 mod assist for LE mobility and trunk upright. Verbal cues for sequencing throughout.  use of pad to scoot to EOB.  Transfers Overall transfer level: Needs assistance Equipment used: Left platform walker;2 person hand held assist Transfers: Sit to/from Stand;Stand Pivot Transfers Sit to Stand: +2 physical assistance;Mod assist Stand pivot transfers: +2 physical assistance;Mod assist       General transfer comment: Initially stand pivot transfer bed to 3N1 with mod assist of 2.  Pt urinated and then stood to [place] To  be cleaned with mod assist of 2.  Fatigued quickly and needed a seated rest break prior to staniding again and taking pivotal shimmy of right foot around to recliner with use of PFRW.    Ambulation/Gait             General Gait Details: NT- pt unable to demonstrate at this time   Stairs            Wheelchair Mobility    Modified Rankin (Stroke Patients Only)       Balance Overall balance assessment: Needs assistance Sitting-balance support: Feet supported;Single extremity supported Sitting balance-Leahy Scale: Fair Sitting balance - Comments: Can sit EOB with min to min guard assist today.  Postural control: Right lateral lean Standing balance support: During functional activity;Bilateral upper extremity supported Standing balance-Leahy Scale: Poor Standing balance comment: Pt requires mod assist +2 to maintain standing balance with PFRW  Pt was able to stand more upright today.                    Cognition Arousal/Alertness: Awake/alert Behavior During Therapy: WFL for tasks assessed/performed Overall Cognitive Status: Within Functional Limits for tasks assessed                      Exercises      General Comments        Pertinent Vitals/Pain Pain Assessment: 0-10 Pain Score: 6  Pain Location: L hip/pelvis Pain Descriptors / Indicators: Aching;Grimacing;Guarding Pain Intervention(s): Limited activity within patient's tolerance;Monitored  during session;Premedicated before session;Repositioned  VSS    Home Living                      Prior Function            PT Goals (current goals can now be found in the care plan section) Acute Rehab PT Goals Patient Stated Goal: To walk again. Progress towards PT goals: Progressing toward goals    Frequency    Min 4X/week      PT Plan Current plan remains appropriate    Co-evaluation             End of Session Equipment Utilized During Treatment: Gait belt Activity  Tolerance: Patient limited by fatigue Patient left: with call bell/phone within reach;with family/visitor present;in chair;with chair alarm set     Time: [id number] PT Time Calculation (min) (ACUTE ONLY): 18 min  Charges:  $Therapeutic Activity: 8-22 mins                    G Codes:      Omario Ander F Ralph Brouwer 2016-08-24, 2:22 PM  Thomson Herbers,PT Acute Rehabilitation [phone] (pager)

## 2016-08-20 NOTE — Progress Notes (Signed)
Report given to staff at [facility] facility.

## 2016-08-20 NOTE — Discharge Summary (Signed)
Physician Discharge Summary  Patient ID: Yvonne [name] MRN: [id number] DOB/AGE: 1926-05-10 82 y.o.  Admit date: 08/15/2016 Discharge date: 08/20/2016  Discharge Diagnoses Patient Active Problem List   Diagnosis Date Noted  . Fracture of multiple ribs of left side 08/18/2016  . Left wrist fracture 08/18/2016  . Closed left subtrochanteric femur fracture ([place]) 08/18/2016  . Closed fracture of left superior pubic ramus ([place]) 08/18/2016  . NSVT (nonsustained ventricular tachycardia) ([place])   . Fall 08/16/2016  . Physical exam 08/08/2016  . MVP (mitral valve prolapse) 06/24/2016  . Upper airway cough syndrome 03/07/2016  . Shortness of breath 02/23/2016  . Other fatigue 02/23/2016  . Compression fracture of thoracic vertebra ([place]) 01/22/2016  . Traumatic compression fracture of T7 thoracic vertebra ([place]) 01/13/2016  . Abdominal pain, epigastric 12/28/2015  . Osteoporosis 12/28/2015  . Surgery, elective 12/09/2015  . Compression fracture of L5 lumbar vertebra ([place]) 12/09/2015  . Second degree AV block 06/26/2015  . Aortic insufficiency 05/25/2014  . Compression fracture of body of thoracic vertebra (HCC) 04/01/2014  . Compression fracture of L4 lumbar vertebra (HCC) 04/01/2014  . Orthostatic hypotension 12/19/2013  . Syncope 12/19/2013  . AV block, 1st degree 12/19/2013  . Malnourished ([place]) 12/19/2013  . Hx: UTI (urinary tract infection) 12/19/2013  . Systolic murmur [id number]  . Pelvic fracture ([place]) 10/25/2012  . Thoracic aortic aneurysm ([place])   . History of PSVT (paroxysmal supraventricular tachycardia)   . Compression fracture of L2 lumbar vertebra (HCC)   . Afib ([place])   . Volume overload   . SVT (supraventricular tachycardia) ([place]) 09/05/2010  . Hypothyroidism 09/02/2010  . Essential hypertension 09/02/2010    Consultants Dr. Melina Schools for orthopedic surgery  Dr. Roseanne Kaufman for hand surgery  Dr. Quay Burow for cardiology  Dr. Alysia Penna for  PM&R   Procedures 12/21 -- Open reduction and internal fixation greater than 7 part articular distal radius fracture with a volar rim DVR plate and screw construct as well as 5 mL of StaGraft, four view x-ray series, closed manipulation distal ulnar shaft fracture in the very distal portion, I and D and repair of 3 cm skin tear dorsal ulnar hand, and excisional debridement with knife, curette and scissors by Dr. Amedeo Plenty   HPI: Yvonne [name] was evaluated at the [facility] ER secondary to a fall. She was walking on the driveway and fell onto her left side. She was found to have a left distal both bone forearm fracture, left rib fractures, and a left periprosthesis hip fracture. She was admitted to the trauma service and orthopedic surgery was consulted.   Hospital Course: The patient did not suffer any respiratory compromise from her rib fractures. Orthopedic surgery recommended non-operative treatment of her pelvic and hip fractures. She had a short run of ventricular tachycardia and cardiology was consulted but were not concerned. Hand surgery recommended operative fixation for her wrist and she was taken to the OR for the listed procedure. She was mobilized with physical and occupational therapies who recommended inpatient rehabilitation and they were consulted. As she did not have 24 hour supervision available at discharge they recommended skilled nursing facility placement. Her pain was controlled on oral medications. She was discharged to the facility in good condition.   Allergies as of 08/20/2016   No Known Allergies     Medication List    TAKE these medications   acetaminophen 500 MG tablet Commonly known as:  TYLENOL Take 1,000 mg by mouth every 6 (six) hours as  needed (For pain.).   amLODipine 5 MG tablet Commonly known as:  NORVASC TAKE 1 TABLET BY MOUTH  DAILY   amoxicillin 500 MG tablet Commonly known as:  AMOXIL Take 4 tablets (2gram) by mouth one hour prior to dental work.    aspirin EC 325 MG tablet Take 325 mg by mouth every evening.   calcium-vitamin D 500-200 MG-UNIT tablet Commonly known as:  OSCAL WITH D Take 1 tablet by mouth daily at 12 noon.   cholecalciferol 1000 units tablet Commonly known as:  VITAMIN D Take 2,000 Units by mouth daily at 12 noon.   ciprofloxacin 500 MG tablet Commonly known as:  CIPRO Take 1 tablet (500 mg total) by mouth 2 (two) times daily.   flecainide 50 MG tablet Commonly known as:  TAMBOCOR Take one-half tablet by  mouth two times daily   HYDROcodone-acetaminophen 5-325 MG tablet Commonly known as:  NORCO/VICODIN Take 0.5-2 tablets by mouth every 4 (four) hours as needed (Pain).   ibuprofen 200 MG tablet Commonly known as:  ADVIL,MOTRIN Take 400 mg by mouth every 6 (six) hours as needed (For pain.).   levothyroxine 50 MCG tablet Commonly known as:  SYNTHROID, LEVOTHROID Take 1 tablet (50 mcg total) by mouth every morning.   losartan 50 MG tablet Commonly known as:  COZAAR Take 25-50 mg by mouth daily. Take whole tablet in the morning then take half tablet at night   multivitamin with minerals Tabs tablet Take 1 tablet by mouth daily.   traMADol 50 MG tablet Commonly known as:  ULTRAM Take 1 tablet (50 mg total) by mouth every 6 (six) hours.       Follow-up Information    Paulene Floor, MD. Schedule an appointment as soon as possible for a visit.   Specialty:  Orthopedic Surgery Contact information: [address] [place] [address] [id number]        Dahlia Bailiff, MD. Schedule an appointment as soon as possible for a visit.   Specialty:  Orthopedic Surgery Contact information: [address] [place] [address] [phone]        [facility] Follow up.   Why:  Call as needed Contact information: [address] [id number] [place] [phone]            Signed: Lisette Abu, PA-C Pager: [phone] General Trauma PA Pager: [phone] 08/20/2016, 9:02 AM

## 2016-08-20 NOTE — Clinical Social Work Placement (Signed)
Medical Social Worker facilitated patient discharge including contacting patient family and facility to confirm patient discharge plans.  Clinical information faxed to facility and family agreeable with plan.  MSW arranged ambulance transport via [place] to [facility].  RN to call report prior to discharge.  Medical Social Worker will sign off for now as social work intervention is no longer needed. Please consult [place] again if new need arises.   CLINICAL SOCIAL WORK PLACEMENT  NOTE  Date:  08/20/2016  Patient Details  Name: Yvonne [name] MRN: [id number] Date of Birth: 05/03/1926  Clinical Social Work is seeking post-discharge placement for this patient at the [place] level of care (*CSW will initial, date and re-position this form in  chart as items are completed):  Yes   Patient/family provided with [facility] Work Department's list of facilities offering this level of care within the geographic area requested by the patient (or if unable, by the patient's family).  Yes   Patient/family informed of their freedom to choose among providers that offer the needed level of care, that participate in Medicare, Medicaid or managed care program needed by the patient, have an available bed and are willing to accept the patient.  Yes   Patient/family informed of [place]'s ownership interest in [facility] and [facility], as well as of the fact that they are under no obligation to receive care at these facilities.  PASRR submitted to EDS on       PASRR number received on       Existing PASRR number confirmed on 08/19/16     FL2 transmitted to all facilities in geographic area requested by pt/family on 08/19/16     FL2 transmitted to all facilities within larger geographic area on       Patient informed that his/her managed care company has contracts with or will negotiate with certain facilities, including the following:         Yes   Patient/family informed of bed offers received.  Patient chooses bed at  [facility] )     Physician recommends and patient chooses bed at      Patient to be transferred to  [facility] ) on 08/20/16.  Patient to be transferred to facility by  [facility] )     Patient family notified on 08/20/16 of transfer.  Name of family member notified:   (Pt's dtr, [name] )     PHYSICIAN Please sign FL2, Please prepare priority discharge summary, including medications     Additional Comment:    _______________________________________________ Glendon Axe A 08/20/2016, 9:29 AM

## 2016-08-20 NOTE — Progress Notes (Signed)
Patient ID: Yvonne [name], female   DOB: 11/15/1925, 82 y.o.   MRN: [id number]   LOS: 4 days   Subjective: Doesn't feel great this morning but non-specific. Still ready to go to SNF.   Objective: Vital signs in last 24 hours: Temp:  [98.6 F (37 C)-98.9 F (37.2 C)] 98.9 F (37.2 C) (12/23 0505) Pulse Rate:  [84-89] 84 (12/23 0505) Resp:  [17-18] 17 (12/23 0505) BP: (108-147)/(36-57) 147/52 (12/23 0505) SpO2:  [91 %-96 %] 94 % (12/23 0505) Last BM Date: 08/15/16   Physical Exam General appearance: alert and no distress Resp: clear to auscultation bilaterally Cardio: regular rate and rhythm GI: normal findings: bowel sounds normal and soft, non-tender Pulses: 2+ and symmetric   Assessment/Plan: Fall in driveway L rib FX 3-5- pulm toilet L distal radius and ulna FX s/p ORIF- NWB per Dr. Amedeo Plenty, can WB through elbow L periprosthetic subtroch femur FX- non-op, TDWB per Dr. Rolena Infante L sup ramus FX- per Dr. Rolena Infante, non-op, no WB restriction for this Hypothyroidism- home synthroid PSVT-- Appreciate cards consult, they've signed off DIspo- SNF today    Lisette Abu, PA-C Pager: [phone] General Trauma PA Pager: [phone]  08/20/2016

## 2016-09-23 ENCOUNTER — Telehealth: Payer: Self-pay | Admitting: Family Medicine

## 2016-09-23 NOTE — Telephone Encounter (Signed)
Given that heart rate, lack of PO intake- pt is likely dehydrated and needs to go to ER.  Especially if they think she has the flu- she is at very high risk given her age.  Since she just got out of the facility- there is also the risk of a facility acquired illness like PNA.

## 2016-09-23 NOTE — Telephone Encounter (Signed)
Pt daughter made aware and states that she is not wanting to take her to the ED at this time. As she is worried about her mom getting sick. Pt daughter advised that she may reevaluate after she gets home and speaks with her mom. Pt daughter made aware that due to symptoms and vital from [facility] that PCP would still take her to the ED.

## 2016-09-23 NOTE — Telephone Encounter (Signed)
Patient's daughter calling to speak with Yvonne [name], advised she is unavailable as they are in clinic.  She states she has to speak with her today regarding her mother who was just discharged from [place] [facility] on yesterday.  She states she can not bring her mother in to the office and needs to speak with Yvonne [name] before the weekend.  She is requesting a call back.

## 2016-09-23 NOTE — Telephone Encounter (Signed)
Called [facility] back and gave the verbal ok for skilled nursing. Also advised of PCP recommendations to take pt to the ER.

## 2016-09-23 NOTE — Telephone Encounter (Signed)
Caller with [facility] asking for verbal orders for skilled nursing 1 x a wk for 1 wk, 2 x a wk for 4 wks, then 1 x a wk for 3 wks. Caller also states that pt heart rate was 130, BP was 140/70 and temp was 100.3. Pt has had 12oz of fluid and ate one piece of toast today. Caller states that she thinks pt may have the flu.

## 2016-09-23 NOTE — Telephone Encounter (Signed)
I strongly disagree w/ this decision.  I would either take her to the ER or call EMS given her high risk.

## 2016-09-28 ENCOUNTER — Telehealth: Payer: Self-pay | Admitting: Family Medicine

## 2016-09-28 NOTE — Telephone Encounter (Signed)
Ok for pt to have home health aide if she qualifies

## 2016-09-28 NOTE — Telephone Encounter (Signed)
Verbal ok given.

## 2016-09-28 NOTE — Telephone Encounter (Signed)
[place] for home health aid?

## 2016-09-28 NOTE — Telephone Encounter (Signed)
Caller LM on VM stating that he is with [facility] and asking for a call back for verbal orders to continue care for OT. Caller also asked for order to have a home health aide.

## 2016-10-24 ENCOUNTER — Telehealth: Payer: Self-pay | Admitting: Family Medicine

## 2016-10-24 MED ORDER — MIRTAZAPINE 15 MG PO TBDP: 15.0000 mg | ORAL_TABLET | Freq: Every day | ORAL | 3 refills | Status: DC

## 2016-10-24 NOTE — Telephone Encounter (Signed)
Ok to send Mirtazapine 15mg , #30, 3 refills

## 2016-10-24 NOTE — Telephone Encounter (Signed)
Pt daughter informed. Med filled to local pharmacy.

## 2016-10-24 NOTE — Telephone Encounter (Signed)
Daughter calling asking if KT would call in mirtazatine 15mg  to [facility] [place], daughter states that pt was on this while in rehab and is now out, daughter would like a call back to let her this has been done.

## 2016-10-24 NOTE — Telephone Encounter (Signed)
Ok for med? 

## 2016-10-26 ENCOUNTER — Telehealth: Payer: Self-pay | Admitting: Cardiovascular Disease

## 2016-10-26 NOTE — Telephone Encounter (Signed)
Understood, thanks. Keep [place] posted. MCr

## 2016-10-26 NOTE — Telephone Encounter (Signed)
New message    Yvonne [name] is calling to report blood pressure.   Pt c/o BP issue: STAT if pt c/o blurred vision, one-sided weakness or slurred speech  1. What are your last 5 BP readings? Today-160/68 Yes-150/78  2. Are you having any other symptoms (ex. Dizziness, headache, blurred vision, passed out)? No  3. What is your BP issue? Pt blood pressure is higher than normal.

## 2016-10-26 NOTE — Telephone Encounter (Signed)
Spoke with pt states that OT therapist was at her house and he stated that her BP was too high today readings were 160/68 150/78. She states that she is not concerned, it usually runs in the mid 130-low 140's, she just wanted to let Dr Sallyanne Kuster know. Pt denies any other symptoms, chest pain or pressure, shortness of breath, edema, no lightheaded or dizziness. She states that she will keep an eye on this and call back.

## 2016-10-26 NOTE — Telephone Encounter (Signed)
noted 

## 2016-11-08 ENCOUNTER — Other Ambulatory Visit: Payer: Self-pay | Admitting: Family Medicine

## 2016-11-17 DIAGNOSIS — S32512D Fracture of superior rim of left pubis, subsequent encounter for fracture with routine healing: Secondary | ICD-10-CM | POA: Diagnosis not present

## 2016-11-17 DIAGNOSIS — S62102D Fracture of unspecified carpal bone, left wrist, subsequent encounter for fracture with routine healing: Secondary | ICD-10-CM | POA: Diagnosis not present

## 2016-11-23 DIAGNOSIS — S62102D Fracture of unspecified carpal bone, left wrist, subsequent encounter for fracture with routine healing: Secondary | ICD-10-CM | POA: Diagnosis not present

## 2016-12-06 ENCOUNTER — Encounter: Payer: Self-pay | Admitting: Physician Assistant

## 2016-12-06 ENCOUNTER — Ambulatory Visit (INDEPENDENT_AMBULATORY_CARE_PROVIDER_SITE_OTHER): Payer: Medicare Other | Admitting: Physician Assistant

## 2016-12-06 VITALS — BP 118/62 | HR 87 | Temp 98.0°F | Resp 14 | Ht 66.0 in | Wt 122.0 lb

## 2016-12-06 DIAGNOSIS — H6123 Impacted cerumen, bilateral: Secondary | ICD-10-CM | POA: Diagnosis not present

## 2016-12-06 NOTE — Patient Instructions (Signed)
We are unable to successfully remove ear wax today. I have gotten you an appointment with Dr. Redmond Baseman tomorrow at 10:20. Please arrive at 10:00 for appointment. Take care!

## 2016-12-06 NOTE — Progress Notes (Signed)
Patient presents to clinic today c/o ear fullness and wax buildup worsening over the past couple of weeks. Notes history significant for cerumen impaction. States she has very tiny ear canals that curve. Uses OTC Debrox with little improvement. States she noted gradual decrease in hearing as wax has built up. Denies ear pain, tinnitus, dizziness or URI symptoms.  Past Medical History:  Diagnosis Date  . Acute blood loss anemia 2011   WITH ANEURYSM ONLY  . Afib ([place])    -no   . Arthritis   . Cancer ([place]) LATE 1970'S   skin cancer - arm squamous  . Compression fracture of L2 lumbar vertebra (HCC)   . GERD (gastroesophageal reflux disease)    "mild"  . Heart murmur   . History of atrial paroxysmal tachycardia   . History of blood transfusion   . History of hypothyroidism   . Hypertension   . Hypothyroidism   . Mitral valve prolapse   . Moderate aortic insufficiency 05/25/2014  . Osteoporosis   . Pericardial tamponade 05/2010  . Pneumonia    many years ago  . Premature atrial contractions   . Syncope    VERY RARE  . Thoracic aortic aneurysm ([place]) 05/2010   Ruptured ascending  . UTI (lower urinary tract infection)    CHRONIC  . Volume overload 2011    Current Outpatient Prescriptions on File Prior to Visit  Medication Sig Dispense Refill  . acetaminophen (TYLENOL) 500 MG tablet Take 1,000 mg by mouth every 6 (six) hours as needed (For pain.).    Marland Kitchen amLODipine (NORVASC) 5 MG tablet TAKE 1 TABLET BY MOUTH  DAILY 90 tablet 2  . aspirin EC 325 MG tablet Take 325 mg by mouth every evening.     . calcium-vitamin D (OSCAL WITH D) 500-200 MG-UNIT per tablet Take 1 tablet by mouth daily at 12 noon.    . cholecalciferol (VITAMIN D) 1000 UNITS tablet Take 2,000 Units by mouth daily at 12 noon.     . flecainide (TAMBOCOR) 50 MG tablet Take one-half tablet by  mouth two times daily 90 tablet 2  . levothyroxine (SYNTHROID, LEVOTHROID) 50 MCG tablet TAKE 1 TABLET BY MOUTH  EVERY MORNING  90 tablet 0  . losartan (COZAAR) 50 MG tablet Take 25-50 mg by mouth daily. Take whole tablet in the morning then take half tablet at night    . mirtazapine (REMERON SOL-TAB) 15 MG disintegrating tablet Take 1 tablet (15 mg total) by mouth at bedtime. 30 tablet 3  . Multiple Vitamin (MULTIVITAMIN WITH MINERALS) TABS tablet Take 1 tablet by mouth daily.    Marland Kitchen amoxicillin (AMOXIL) 500 MG tablet Take 4 tablets (2gram) by mouth one hour prior to dental work. (Patient not taking: Reported on 12/06/2016) 20 tablet 0   No current facility-administered medications on file prior to visit.     No Known Allergies  Family History  Problem Relation Age of Onset  . Heart failure Mother   . COPD Mother     never smoker  . Stroke Father   . Hypertension Brother   . Hypertension Brother   . Hypertension Sister   . Hypertension Sister   . Stroke Sister   . Stomach cancer      nephew  . Breast cancer      neice    Social History   Social History  . Marital status: Widowed    Spouse name: N/A  . Number of children: N/A  . Years  of education: N/A   Social History Main Topics  . Smoking status: Never Smoker  . Smokeless tobacco: Never Used  . Alcohol use No  . Drug use: No  . Sexual activity: Not Asked   Other Topics Concern  . None   Social History Narrative  . None    Review of Systems - See HPI.  All other ROS are negative.  BP 118/62   Pulse 87   Temp 98 F (36.7 C) (Oral)   Resp 14   Ht 5\' 6"  (1.676 m)   Wt 122 lb (55.3 kg)   SpO2 97%   BMI 19.69 kg/m   Physical Exam  Constitutional: She is oriented to person, place, and time and well-developed, well-nourished, and in no distress.  HENT:  Head: Normocephalic and atraumatic.  Cerumen impaction noted bilaterally.  Eyes: Conjunctivae are normal.  Neck: Neck supple.  Cardiovascular: Normal rate, regular rhythm, normal heart sounds and intact distal pulses.   Pulmonary/Chest: Effort normal and breath sounds normal. No  respiratory distress. She has no wheezes. She has no rales. She exhibits no tenderness.  Neurological: She is alert and oriented to person, place, and time.  Skin: Skin is warm and dry. No rash noted.  Vitals reviewed.  Assessment/Plan: 1. Bilateral impacted cerumen Unable to remove manually and with irrigation -- ear canals are very small and with curvature.  Able to get patient appointment tomorrow with her ENT for removal via suction.    Leeanne Rio, PA-C

## 2016-12-06 NOTE — Progress Notes (Signed)
Pre visit review using our clinic review tool, if applicable. No additional management support is needed unless otherwise documented below in the visit note. 

## 2016-12-14 ENCOUNTER — Other Ambulatory Visit: Payer: Self-pay | Admitting: Neurosurgery

## 2016-12-14 DIAGNOSIS — S32050S Wedge compression fracture of fifth lumbar vertebra, sequela: Secondary | ICD-10-CM

## 2016-12-15 ENCOUNTER — Encounter (HOSPITAL_COMMUNITY): Payer: Self-pay | Admitting: *Deleted

## 2016-12-15 ENCOUNTER — Ambulatory Visit
Admission: RE | Admit: 2016-12-15 | Discharge: 2016-12-15 | Disposition: A | Discharge status: Home / Self Care | Payer: Medicare Other | Source: Ambulatory Visit | Attending: Neurosurgery | Admitting: Neurosurgery

## 2016-12-15 DIAGNOSIS — S32050S Wedge compression fracture of fifth lumbar vertebra, sequela: Secondary | ICD-10-CM

## 2016-12-15 MED ORDER — CEFAZOLIN SODIUM-DEXTROSE 2-4 GM/100ML-% IV SOLN
2.0000 g | INTRAVENOUS | Status: AC
  Administered 2016-12-16: 2 g via INTRAVENOUS
  Filled 2016-12-15: qty 100

## 2016-12-15 NOTE — Progress Notes (Signed)
Pt denies any acute cardiopulmonary issues. Pt under the care of Dr. Sallyanne Kuster, Cardiology. Pt stated that her last dose of Aspirin was Tuesday when asked if she was given pre-op instructions regarding Aspirin. Pt made aware to stop taking vitamins, fish oil, and herbal medications. Do not take any NSAIDs ie: Ibuprofen, Advil, Naproxen, BC and Goody Powder . Pt verbalized understanding of all pre-op instructions.

## 2016-12-16 ENCOUNTER — Encounter (HOSPITAL_COMMUNITY): Payer: Self-pay | Admitting: *Deleted

## 2016-12-16 ENCOUNTER — Ambulatory Visit (HOSPITAL_COMMUNITY): Payer: Medicare Other | Admitting: Anesthesiology

## 2016-12-16 ENCOUNTER — Ambulatory Visit (HOSPITAL_COMMUNITY): Payer: Medicare Other

## 2016-12-16 ENCOUNTER — Encounter (HOSPITAL_COMMUNITY)
Admission: RE | Disposition: A | Discharge status: Home / Self Care | Payer: Self-pay | Source: Ambulatory Visit | Attending: Neurosurgery

## 2016-12-16 ENCOUNTER — Ambulatory Visit (HOSPITAL_COMMUNITY)
Admission: RE | Admit: 2016-12-16 | Discharge: 2016-12-17 | Disposition: A | Discharge status: Home / Self Care | Payer: Medicare Other | Source: Ambulatory Visit | Attending: Neurosurgery | Admitting: Neurosurgery

## 2016-12-16 DIAGNOSIS — Z96642 Presence of left artificial hip joint: Secondary | ICD-10-CM | POA: Diagnosis not present

## 2016-12-16 DIAGNOSIS — E039 Hypothyroidism, unspecified: Secondary | ICD-10-CM | POA: Insufficient documentation

## 2016-12-16 DIAGNOSIS — M810 Age-related osteoporosis without current pathological fracture: Secondary | ICD-10-CM | POA: Diagnosis not present

## 2016-12-16 DIAGNOSIS — I10 Essential (primary) hypertension: Secondary | ICD-10-CM | POA: Diagnosis not present

## 2016-12-16 DIAGNOSIS — I739 Peripheral vascular disease, unspecified: Secondary | ICD-10-CM | POA: Insufficient documentation

## 2016-12-16 DIAGNOSIS — Z419 Encounter for procedure for purposes other than remedying health state, unspecified: Secondary | ICD-10-CM

## 2016-12-16 DIAGNOSIS — I480 Paroxysmal atrial fibrillation: Secondary | ICD-10-CM | POA: Insufficient documentation

## 2016-12-16 DIAGNOSIS — Z79899 Other long term (current) drug therapy: Secondary | ICD-10-CM | POA: Insufficient documentation

## 2016-12-16 DIAGNOSIS — Z7982 Long term (current) use of aspirin: Secondary | ICD-10-CM | POA: Diagnosis not present

## 2016-12-16 DIAGNOSIS — Z952 Presence of prosthetic heart valve: Secondary | ICD-10-CM | POA: Diagnosis not present

## 2016-12-16 DIAGNOSIS — M4854XA Collapsed vertebra, not elsewhere classified, thoracic region, initial encounter for fracture: Secondary | ICD-10-CM | POA: Insufficient documentation

## 2016-12-16 DIAGNOSIS — S22000A Wedge compression fracture of unspecified thoracic vertebra, initial encounter for closed fracture: Secondary | ICD-10-CM | POA: Diagnosis present

## 2016-12-16 DIAGNOSIS — I351 Nonrheumatic aortic (valve) insufficiency: Secondary | ICD-10-CM | POA: Insufficient documentation

## 2016-12-16 HISTORY — DX: Fracture of unspecified carpal bone, unspecified wrist, initial encounter for closed fracture: S62.109A

## 2016-12-16 HISTORY — PX: KYPHOPLASTY: SHX5884

## 2016-12-16 HISTORY — DX: Wedge compression fracture of unspecified thoracic vertebra, initial encounter for closed fracture: S22.000A

## 2016-12-16 LAB — BASIC METABOLIC PANEL
ANION GAP: 9 (ref 5–15)
BUN: 20 mg/dL (ref 6–20)
CHLORIDE: 103 mmol/L (ref 101–111)
CO2: 26 mmol/L (ref 22–32)
Calcium: 9.3 mg/dL (ref 8.9–10.3)
Creatinine, Ser: 0.94 mg/dL (ref 0.44–1.00)
GFR calc non Af Amer: 53 mL/min — ABNORMAL LOW (ref >60)
Glucose, Bld: 113 mg/dL — ABNORMAL HIGH (ref 65–99)
Potassium: 4.4 mmol/L (ref 3.5–5.1)
Sodium: 138 mmol/L (ref 135–145)

## 2016-12-16 LAB — CBC
HCT: 38.1 % (ref 36.0–46.0)
HEMOGLOBIN: 12.1 g/dL (ref 12.0–15.0)
MCH: 29.4 pg (ref 26.0–34.0)
MCHC: 31.8 g/dL (ref 30.0–36.0)
MCV: 92.5 fL (ref 78.0–100.0)
Platelets: 147 10*3/uL — ABNORMAL LOW (ref 150–400)
RBC: 4.12 MIL/uL (ref 3.87–5.11)
RDW: 14.8 % (ref 11.5–15.5)
WBC: 6.4 10*3/uL (ref 4.0–10.5)

## 2016-12-16 SURGERY — KYPHOPLASTY
Anesthesia: General

## 2016-12-16 MED ORDER — FENTANYL CITRATE (PF) 100 MCG/2ML IJ SOLN
INTRAMUSCULAR | Status: AC
  Administered 2016-12-16: 25 ug
  Filled 2016-12-16: qty 2

## 2016-12-16 MED ORDER — MIRTAZAPINE 15 MG PO TBDP
15.0000 mg | ORAL_TABLET | Freq: Every day | ORAL | Status: DC
  Administered 2016-12-16: 15 mg via ORAL
  Filled 2016-12-16: qty 1

## 2016-12-16 MED ORDER — OXYCODONE HCL 5 MG PO TABS
5.0000 mg | ORAL_TABLET | Freq: Four times a day (QID) | ORAL | Status: DC | PRN
  Administered 2016-12-16 – 2016-12-17 (×3): 5 mg via ORAL
  Filled 2016-12-16 (×3): qty 1

## 2016-12-16 MED ORDER — ACETAMINOPHEN 500 MG PO TABS
1000.0000 mg | ORAL_TABLET | Freq: Four times a day (QID) | ORAL | Status: DC | PRN
  Administered 2016-12-16 – 2016-12-17 (×2): 1000 mg via ORAL
  Filled 2016-12-16 (×2): qty 2

## 2016-12-16 MED ORDER — HYDROMORPHONE HCL 1 MG/ML IJ SOLN
0.2500 mg | INTRAMUSCULAR | Status: DC | PRN
  Administered 2016-12-16 (×2): 0.25 mg via INTRAVENOUS

## 2016-12-16 MED ORDER — ROCURONIUM BROMIDE 10 MG/ML (PF) SYRINGE
PREFILLED_SYRINGE | INTRAVENOUS | Status: AC
  Filled 2016-12-16: qty 5

## 2016-12-16 MED ORDER — PHENYLEPHRINE HCL 10 MG/ML IJ SOLN
INTRAVENOUS | Status: DC | PRN
  Administered 2016-12-16: 45 ug/min via INTRAVENOUS

## 2016-12-16 MED ORDER — CEFAZOLIN SODIUM 1 G IJ SOLR
INTRAMUSCULAR | Status: AC
  Filled 2016-12-16: qty 20

## 2016-12-16 MED ORDER — LIDOCAINE 2% (20 MG/ML) 5 ML SYRINGE
INTRAMUSCULAR | Status: AC
  Filled 2016-12-16: qty 5

## 2016-12-16 MED ORDER — IOPAMIDOL (ISOVUE-300) INJECTION 61%
INTRAVENOUS | Status: AC
  Filled 2016-12-16: qty 50

## 2016-12-16 MED ORDER — 0.9 % SODIUM CHLORIDE (POUR BTL) OPTIME
TOPICAL | Status: DC | PRN
  Administered 2016-12-16: 1000 mL

## 2016-12-16 MED ORDER — PROPOFOL 10 MG/ML IV BOLUS
INTRAVENOUS | Status: DC | PRN
Start: 2016-12-16 — End: 2016-12-16
  Administered 2016-12-16: 100 mg via INTRAVENOUS

## 2016-12-16 MED ORDER — LACTATED RINGERS IV SOLN
INTRAVENOUS | Status: DC | PRN
  Administered 2016-12-16: 12:00:00 via INTRAVENOUS

## 2016-12-16 MED ORDER — CALCIUM CARBONATE-VITAMIN D 500-200 MG-UNIT PO TABS: 1.0000 | ORAL_TABLET | Freq: Every day | ORAL | Status: DC

## 2016-12-16 MED ORDER — HYDROMORPHONE HCL 1 MG/ML IJ SOLN
INTRAMUSCULAR | Status: AC
  Filled 2016-12-16: qty 0.5

## 2016-12-16 MED ORDER — IOPAMIDOL (ISOVUE-300) INJECTION 61%
INTRAVENOUS | Status: DC | PRN
  Administered 2016-12-16: 50 mL

## 2016-12-16 MED ORDER — AMLODIPINE BESYLATE 5 MG PO TABS
5.0000 mg | ORAL_TABLET | Freq: Every day | ORAL | Status: DC
  Administered 2016-12-17: 5 mg via ORAL
  Filled 2016-12-16: qty 1

## 2016-12-16 MED ORDER — CHLORHEXIDINE GLUCONATE CLOTH 2 % EX PADS: 6.0000 | MEDICATED_PAD | Freq: Once | CUTANEOUS | Status: DC

## 2016-12-16 MED ORDER — POTASSIUM CHLORIDE IN NACL 20-0.9 MEQ/L-% IV SOLN: INTRAVENOUS | Status: DC

## 2016-12-16 MED ORDER — LOSARTAN POTASSIUM 50 MG PO TABS
50.0000 mg | ORAL_TABLET | Freq: Every day | ORAL | Status: DC
  Administered 2016-12-16 – 2016-12-17 (×2): 50 mg via ORAL
  Filled 2016-12-16 (×2): qty 1

## 2016-12-16 MED ORDER — HYDROMORPHONE HCL 1 MG/ML IJ SOLN: 0.2500 mg | INTRAMUSCULAR | Status: DC | PRN

## 2016-12-16 MED ORDER — ONDANSETRON HCL 4 MG/2ML IJ SOLN
INTRAMUSCULAR | Status: AC
  Filled 2016-12-16: qty 2

## 2016-12-16 MED ORDER — LEVOTHYROXINE SODIUM 100 MCG PO TABS
50.0000 ug | ORAL_TABLET | Freq: Every day | ORAL | Status: DC
  Administered 2016-12-17: 50 ug via ORAL
  Filled 2016-12-16: qty 1

## 2016-12-16 MED ORDER — LACTATED RINGERS IV SOLN
INTRAVENOUS | Status: DC
  Administered 2016-12-16: 10:00:00 via INTRAVENOUS

## 2016-12-16 MED ORDER — LOSARTAN POTASSIUM 50 MG PO TABS
25.0000 mg | ORAL_TABLET | Freq: Every day | ORAL | Status: DC
  Administered 2016-12-16: 25 mg via ORAL
  Filled 2016-12-16: qty 1

## 2016-12-16 MED ORDER — FENTANYL CITRATE (PF) 250 MCG/5ML IJ SOLN
INTRAMUSCULAR | Status: AC
  Filled 2016-12-16: qty 5

## 2016-12-16 MED ORDER — ONDANSETRON HCL 4 MG/2ML IJ SOLN
INTRAMUSCULAR | Status: DC | PRN
  Administered 2016-12-16: 4 mg via INTRAVENOUS

## 2016-12-16 MED ORDER — METHOCARBAMOL 500 MG PO TABS
500.0000 mg | ORAL_TABLET | Freq: Four times a day (QID) | ORAL | Status: DC | PRN
  Administered 2016-12-16 – 2016-12-17 (×2): 500 mg via ORAL
  Filled 2016-12-16 (×2): qty 1

## 2016-12-16 MED ORDER — SUGAMMADEX SODIUM 200 MG/2ML IV SOLN
INTRAVENOUS | Status: AC
  Filled 2016-12-16: qty 2

## 2016-12-16 MED ORDER — ASPIRIN EC 325 MG PO TBEC
325.0000 mg | DELAYED_RELEASE_TABLET | Freq: Every evening | ORAL | Status: DC
  Administered 2016-12-16: 325 mg via ORAL
  Filled 2016-12-16: qty 1

## 2016-12-16 MED ORDER — FLECAINIDE ACETATE 50 MG PO TABS
25.0000 mg | ORAL_TABLET | Freq: Two times a day (BID) | ORAL | Status: DC
  Administered 2016-12-16 – 2016-12-17 (×2): 25 mg via ORAL
  Filled 2016-12-16 (×2): qty 1

## 2016-12-16 MED ORDER — ROCURONIUM BROMIDE 100 MG/10ML IV SOLN
INTRAVENOUS | Status: DC | PRN
  Administered 2016-12-16: 30 mg via INTRAVENOUS

## 2016-12-16 MED ORDER — LIDOCAINE-EPINEPHRINE 1 %-1:100000 IJ SOLN
INTRAMUSCULAR | Status: DC | PRN
  Administered 2016-12-16: 10 mL

## 2016-12-16 MED ORDER — SUGAMMADEX SODIUM 200 MG/2ML IV SOLN
INTRAVENOUS | Status: DC | PRN
  Administered 2016-12-16: 150 mg via INTRAVENOUS

## 2016-12-16 MED ORDER — FENTANYL CITRATE (PF) 100 MCG/2ML IJ SOLN
INTRAMUSCULAR | Status: DC | PRN
  Administered 2016-12-16: 50 ug via INTRAVENOUS

## 2016-12-16 MED ORDER — LIDOCAINE-EPINEPHRINE 1 %-1:100000 IJ SOLN
INTRAMUSCULAR | Status: AC
  Filled 2016-12-16: qty 1

## 2016-12-16 MED ORDER — LIDOCAINE HCL (CARDIAC) 20 MG/ML IV SOLN
INTRAVENOUS | Status: DC | PRN
  Administered 2016-12-16: 60 mg via INTRAVENOUS

## 2016-12-16 MED ORDER — DEXTROSE 5 % IV SOLN
INTRAVENOUS | Status: DC | PRN
  Administered 2016-12-16: 12:00:00 via INTRAVENOUS

## 2016-12-16 MED ORDER — VITAMIN D 1000 UNITS PO TABS: 2000.0000 [IU] | ORAL_TABLET | Freq: Every day | ORAL | Status: DC

## 2016-12-16 SURGICAL SUPPLY — 41 items
ADH SKN CLS APL DERMABOND .7 (GAUZE/BANDAGES/DRESSINGS) ×1
BLADE CLIPPER SURG (BLADE) IMPLANT
BLADE SURG 15 STRL LF DISP TIS (BLADE) ×1 IMPLANT
BLADE SURG 15 STRL SS (BLADE) ×3
CARTRIDGE OIL MAESTRO DRILL (MISCELLANEOUS) ×1 IMPLANT
CEMENT KYPHON C01A KIT/MIXER (Cement) ×2 IMPLANT
CONT SPEC 4OZ CLIKSEAL STRL BL (MISCELLANEOUS) ×2 IMPLANT
DERMABOND ADVANCED (GAUZE/BANDAGES/DRESSINGS) ×2
DERMABOND ADVANCED .7 DNX12 (GAUZE/BANDAGES/DRESSINGS) ×1 IMPLANT
DIFFUSER DRILL AIR PNEUMATIC (MISCELLANEOUS) ×1 IMPLANT
DRAPE C-ARM 42X72 X-RAY (DRAPES) ×3 IMPLANT
DRAPE HALF SHEET 40X57 (DRAPES) ×3 IMPLANT
DRAPE LAPAROTOMY 100X72X124 (DRAPES) ×3 IMPLANT
DRAPE SURG 17X23 STRL (DRAPES) ×3 IMPLANT
DRAPE WARM FLUID 44X44 (DRAPE) ×3 IMPLANT
DURAPREP 26ML APPLICATOR (WOUND CARE) ×3 IMPLANT
GAUZE SPONGE 4X4 16PLY XRAY LF (GAUZE/BANDAGES/DRESSINGS) ×3 IMPLANT
GLOVE ECLIPSE 6.5 STRL STRAW (GLOVE) ×3 IMPLANT
GLOVE EXAM NITRILE LRG STRL (GLOVE) IMPLANT
GLOVE EXAM NITRILE XL STR (GLOVE) IMPLANT
GLOVE EXAM NITRILE XS STR PU (GLOVE) IMPLANT
GOWN STRL REUS W/ TWL LRG LVL3 (GOWN DISPOSABLE) ×2 IMPLANT
GOWN STRL REUS W/ TWL XL LVL3 (GOWN DISPOSABLE) IMPLANT
GOWN STRL REUS W/TWL 2XL LVL3 (GOWN DISPOSABLE) ×2 IMPLANT
GOWN STRL REUS W/TWL LRG LVL3 (GOWN DISPOSABLE) ×3
GOWN STRL REUS W/TWL XL LVL3 (GOWN DISPOSABLE)
KIT BASIN OR (CUSTOM PROCEDURE TRAY) ×3 IMPLANT
KIT ROOM TURNOVER OR (KITS) ×3 IMPLANT
NDL HYPO 25X1 1.5 SAFETY (NEEDLE) ×1 IMPLANT
NEEDLE HYPO 25X1 1.5 SAFETY (NEEDLE) ×3 IMPLANT
NS IRRIG 1000ML POUR BTL (IV SOLUTION) ×3 IMPLANT
OIL CARTRIDGE MAESTRO DRILL (MISCELLANEOUS)
PACK SURGICAL SETUP 50X90 (CUSTOM PROCEDURE TRAY) ×3 IMPLANT
PAD ARMBOARD 7.5X6 YLW CONV (MISCELLANEOUS) ×9 IMPLANT
SPECIMEN JAR SMALL (MISCELLANEOUS) IMPLANT
STAPLER SKIN PROX WIDE 3.9 (STAPLE) ×3 IMPLANT
SUT VIC AB 3-0 SH 8-18 (SUTURE) ×3 IMPLANT
SYR CONTROL 10ML LL (SYRINGE) ×4 IMPLANT
TOWEL GREEN STERILE (TOWEL DISPOSABLE) ×2 IMPLANT
TOWEL GREEN STERILE FF (TOWEL DISPOSABLE) ×3 IMPLANT
TRAY KYPHOPAK 15/3 ONESTEP 1ST (MISCELLANEOUS) ×2 IMPLANT

## 2016-12-16 NOTE — H&P (Signed)
BP (!) 202/59   Pulse 80   Temp 98.4 F (36.9 C) (Oral)   Resp 16   Ht 5\' 6"  (1.676 m)   Wt 55.3 kg (122 lb)   SpO2 99%   BMI 19.69 kg/m  Cc: thoracic spine pain Yvonne [name] is a long term patient of mine with multiple thoracic and lumbar spine compression fractures. She had new onset pain within the last 2 weeks and thoracic spine films revealed a new compression fracture at T5. This was confirmed by a CT of the thoracic spine.  No Known Allergies Prior to Admission medications   Medication Sig Start Date End Date Taking? Authorizing Provider  acetaminophen (TYLENOL) 500 MG tablet Take 1,000 mg by mouth every 6 (six) hours as needed for moderate pain or headache.    Yes Historical Provider, MD  amLODipine (NORVASC) 5 MG tablet TAKE 1 TABLET BY MOUTH  DAILY 07/25/16  Yes Mihai Croitoru, MD  aspirin EC 325 MG tablet Take 325 mg by mouth every evening.    Yes Historical Provider, MD  calcium-vitamin D (OSCAL WITH D) 500-200 MG-UNIT per tablet Take 1 tablet by mouth daily at 12 noon.   Yes Historical Provider, MD  cholecalciferol (VITAMIN D) 1000 UNITS tablet Take 2,000 Units by mouth daily at 12 noon.    Yes Historical Provider, MD  flecainide (TAMBOCOR) 50 MG tablet Take one-half tablet by  mouth two times daily 03/11/16  Yes Mihai Croitoru, MD  levothyroxine (SYNTHROID, LEVOTHROID) 50 MCG tablet TAKE 1 TABLET BY MOUTH  EVERY MORNING 11/08/16  Yes Midge Minium, MD  losartan (COZAAR) 50 MG tablet Take 25-50 mg by mouth See admin instructions. Take 50 mg in the morning then take 25 mg at night 07/05/16  Yes Historical Provider, MD  mirtazapine (REMERON SOL-TAB) 15 MG disintegrating tablet Take 1 tablet (15 mg total) by mouth at bedtime. 10/24/16  Yes Midge Minium, MD  oxyCODONE (OXY IR/ROXICODONE) 5 MG immediate release tablet Take 5 mg by mouth every 6 (six) hours as needed for moderate pain.  12/13/16  Yes Historical Provider, MD  amoxicillin (AMOXIL) 500 MG tablet Take 4 tablets  (2gram) by mouth one hour prior to dental work. Patient not taking: Reported on 12/06/2016 07/10/15   Sanda Klein, MD   Past Surgical History:  Procedure Laterality Date  . ABLATION    . APPENDECTOMY    . BREAST SURGERY Right    right biospy  . Classic atrioventricular nodal reentrant  10/20/2010  . COLONOSCOPY    . EUS N/A 02/18/2016   Procedure: UPPER ENDOSCOPIC ULTRASOUND (EUS) LINEAR;  Surgeon: Milus Banister, MD;  Location: [facility];  Service: Endoscopy;  Laterality: N/A;  . EYE SURGERY Bilateral 2003   cataract bilateral  . HERNIA REPAIR  1993   Right Inguinal  . KYPHOPLASTY  09/05/2012   Procedure: KYPHOPLASTY;  Surgeon: Winfield Cunas, MD;  Location: [facility];  Service: Neurosurgery;  Laterality: N/A;  L1 kyphoplasty  . KYPHOPLASTY N/A 04/01/2014   Procedure: KYPHOPLASTY;  Surgeon: Ashok Pall, MD;  Location: [place] [facility];  Service: Neurosurgery;  Laterality: N/A;  Lumbar Four Kyphoplasty  . KYPHOPLASTY N/A 08/07/2015   Procedure: KYPHOPLASTY LUMBAR THREE;  Surgeon: Ashok Pall, MD;  Location: [place] [facility];  Service: Neurosurgery;  Laterality: N/A;  . KYPHOPLASTY N/A 12/09/2015   Procedure: Lumbar five, KYPHOPLASTY;  Surgeon: Ashok Pall, MD;  Location: [place];  Service: Neurosurgery;  Laterality: N/A;  L5 kyphoplasty  . KYPHOPLASTY  N/A 01/13/2016   Procedure: Thoracic seven KYPHOPLASTY;  Surgeon: Ashok Pall, MD;  Location: [facility];  Service: Neurosurgery;  Laterality: N/A;  . KYPHOPLASTY N/A 01/22/2016   Procedure: THORACIC EIGHT KYPHOPLASTY;  Surgeon: Ashok Pall, MD;  Location: [facility];  Service: Neurosurgery;  Laterality: N/A;  T8 KYPHOPLASTY  . L2 kyphoplasty using Kyphon system.  03/17/2009  . ORIF WRIST FRACTURE Left 08/17/2016   Procedure: OPEN REDUCTION INTERNAL FIXATION (ORIF) WRIST FRACTURE;  Surgeon: Roseanne Kaufman, MD;  Location: [facility];  Service: Orthopedics;  Laterality: Left;  block done as well   . PERCUTANEOUS PINNING WRIST FRACTURE  Bilateral 1995   bil  . Replacement of ascending aorta and hemiarch and resuspension of the aortic valve with circulatory cardiopulmonary bypass and circulatory arrest  06/25/2010   Dr Servando Snare  . Revision of left total hip replacement using some allograft  11/23/2010  . TOE OSTEOTOMY     Right 2nd   . TONSILLECTOMY    . TUBAL LIGATION  1966   Past Medical History:  Diagnosis Date  . Acute blood loss anemia 2011   WITH ANEURYSM ONLY  . Afib ([place])    -no   . Arthritis   . Cancer ([place]) LATE 1970'S   skin cancer - arm squamous  . Compression fracture of body of thoracic vertebra ([place])    T 5  . Compression fracture of L2 lumbar vertebra ([facility])   . GERD (gastroesophageal reflux disease)    "mild"  . Heart murmur   . History of atrial paroxysmal tachycardia   . History of blood transfusion   . History of hypothyroidism   . Hypertension   . Hypothyroidism   . Mitral valve prolapse   . Moderate aortic insufficiency 05/25/2014  . Osteoporosis   . Pericardial tamponade 05/2010  . Pneumonia    many years ago  . Premature atrial contractions   . Syncope    VERY RARE  . Thoracic aortic aneurysm ([place]) 05/2010   Ruptured ascending  . UTI (lower urinary tract infection)    CHRONIC  . Volume overload 2011  . Wrist fracture    left   Family History  Problem Relation Age of Onset  . Heart failure Mother   . COPD Mother     never smoker  . Stroke Father   . Hypertension Brother   . Hypertension Brother   . Hypertension Sister   . Hypertension Sister   . Stroke Sister   . Stomach cancer      nephew  . Breast cancer      neice   Social History   Social History  . Marital status: Widowed    Spouse name: N/A  . Number of children: N/A  . Years of education: N/A   Occupational History  . Not on file.   Social History Main Topics  . Smoking status: Never Smoker  . Smokeless tobacco: Never Used  . Alcohol use No  . Drug use: No  . Sexual activity: Not on file    Other Topics Concern  . Not on file   Social History Narrative  . No narrative on file   Physical Exam  Constitutional: She is oriented to person, place, and time. She appears well-developed and well-nourished. She appears distressed.  HENT:  Head: Normocephalic and atraumatic.  Eyes: Conjunctivae and EOM are normal. Pupils are equal, round, and reactive to light.  Neck: Normal range of motion. Neck supple.  Cardiovascular: Normal rate, regular rhythm  and normal heart sounds.   Pulmonary/Chest: Effort normal and breath sounds normal.  Abdominal: Soft. Bowel sounds are normal.  Neurological: She is alert and oriented to person, place, and time. She has normal strength. She displays normal reflexes. No cranial nerve deficit or sensory deficit. She exhibits normal muscle tone. She displays a negative Romberg sign. Coordination and gait normal. She displays no Babinski's sign on the right side. She displays no Babinski's sign on the left side.  Reflex Scores:      Tricep reflexes are 2+ on the right side and 2+ on the left side.      Bicep reflexes are 2+ on the right side and 2+ on the left side.      Brachioradialis reflexes are 2+ on the right side and 2+ on the left side.      Patellar reflexes are 2+ on the right side and 2+ on the left side.      Achilles reflexes are 2+ on the right side and 2+ on the left side. Skin: Skin is warm and dry.  Psychiatric: She has a normal mood and affect. Her behavior is normal. Thought content normal.   Admit for kyphoplasty at T5. Risks and benefits are well understood since Yvonne [name] has had this procedure greater than 5 times. She would like to proceed.

## 2016-12-16 NOTE — Op Note (Signed)
12/16/2016  1:42 PM  PATIENT:  Yvonne [name]  83 y.o. female with a subacute T5 fracture, compression  PRE-OPERATIVE DIAGNOSIS:  WEDGE COMPRESSION FRACTURE OF FITH LUMBAR VERTEBRA  POST-OPERATIVE DIAGNOSIS:  WEDGE COMPRESSION FRACTURE OF FITH LUMBAR VERTEBRA  PROCEDURE:  Procedure(s): KYPHOPLASTY THORACIC FIVE  SURGEON:  Surgeon(s): Ashok Pall, MD  ANESTHESIA:   general  EBL:  Total I/O In: 900 [I.V.:900] Out: 0   BLOOD ADMINISTERED:none  COUNT:per nursing  SPECIMEN:  No Specimen  DICTATION: [name] was taken to the operating room, intubated and placed under a general anesthetic without difficulty. Shewas positioned prone on the operating room table with all pressure points properly padded. Her back was prepped and draped in a sterile manner. With fluoroscopy I localized the T5 pedicles bilaterally. I injected lidocaine into the entry sites on both the left and right sides. I started by making a stab incision on the right side and entering the right T5pedicle with fluoroscopic guidance. Once good position was obtained, I drilled into the vertebral body. I then placed the kyphoplasty balloon into the T5 vertebra and inflated the balloon. I then inserted 3cc of methylmethacrylate into the vertebral body under fluoroscopic guidance. I achieved a very good fill of the cavity, staying within the confines of the vertebral body. I removed the instrumentation from the vertebral body, and the final films looked good. I closed the stab incision with vicryl suture and used Dermabond for a sterile dressing. Marland Kitchen    PLAN OF CARE: Admit to inpatient   PATIENT DISPOSITION:  PACU - hemodynamically stable.   Delay start of Pharmacological VTE agent (>24hrs) due to surgical blood loss or risk of bleeding:  yes

## 2016-12-16 NOTE — Anesthesia Procedure Notes (Signed)
Procedure Name: Intubation Date/Time: 12/16/2016 12:22 PM Performed by: Jacquiline Doe A Pre-anesthesia Checklist: Patient identified, Emergency Drugs available, Suction available and Patient being monitored Patient Re-evaluated:Patient Re-evaluated prior to inductionOxygen Delivery Method: Circle System Utilized and Circle system utilized Preoxygenation: Pre-oxygenation with 100% oxygen Intubation Type: IV induction and Cricoid Pressure applied Ventilation: Mask ventilation without difficulty Laryngoscope Size: Mac and 3 Grade View: Grade I Tube type: Oral Tube size: 7.0 mm Number of attempts: 1 Airway Equipment and Method: Stylet Placement Confirmation: ETT inserted through vocal cords under direct vision,  positive ETCO2 and breath sounds checked- equal and bilateral Secured at: 21 cm Tube secured with: Tape Dental Injury: Teeth and Oropharynx as per pre-operative assessment

## 2016-12-16 NOTE — Transfer of Care (Signed)
Immediate Anesthesia Transfer of Care Note  Patient: Yvonne [name]  Procedure(s) Performed: Procedure(s) with comments: KYPHOPLASTY THORACIC FIVE (N/A) - KYPHOPLASTY THORACIC 5  Patient Location: PACU  Anesthesia Type:General  Level of Consciousness: awake, oriented, sedated, patient cooperative and responds to stimulation  Airway & Oxygen Therapy: Patient Spontanous Breathing and Patient connected to nasal cannula oxygen  Post-op Assessment: Report given to RN, Post -op Vital signs reviewed and stable, Patient moving all extremities and Patient moving all extremities X 4  Post vital signs: Reviewed and stable  Last Vitals:  Vitals:   12/16/16 0909 12/16/16 1324  BP: (!) 202/59 (!) 187/74  Pulse:  81  Resp:  (!) 22  Temp:  (P) 36.3 C    Last Pain:  Vitals:   12/16/16 1007  TempSrc:   PainSc: 7          Complications: No apparent anesthesia complications

## 2016-12-16 NOTE — Anesthesia Postprocedure Evaluation (Signed)
Anesthesia Post Note  Patient: CRISTA NUON  Procedure(s) Performed: Procedure(s) (LRB): KYPHOPLASTY THORACIC FIVE (N/A)  Patient location during evaluation: PACU Anesthesia Type: General Level of consciousness: awake and alert Pain management: pain level controlled Vital Signs Assessment: post-procedure vital signs reviewed and stable Respiratory status: spontaneous breathing, nonlabored ventilation, respiratory function stable and patient connected to nasal cannula oxygen Cardiovascular status: blood pressure returned to baseline and stable Postop Assessment: no signs of nausea or vomiting Anesthetic complications: no       Last Vitals:  Vitals:   12/16/16 1415 12/16/16 1430  BP: (!) 155/63 136/73  Pulse:    Resp:    Temp:      Last Pain:  Vitals:   12/16/16 1400  TempSrc:   PainSc: Asleep                 Kiosha Buchan,W. EDMOND

## 2016-12-16 NOTE — Anesthesia Preprocedure Evaluation (Addendum)
Anesthesia Evaluation  Patient identified by MRN, date of birth, ID band Patient awake    Reviewed: Allergy & Precautions, H&P , NPO status , Patient's Chart, lab work & pertinent test results  Airway Mallampati: II  TM Distance: >3 FB Neck ROM: Full    Dental no notable dental hx. (+) Upper Dentures, Dental Advisory Given   Pulmonary neg pulmonary ROS,    Pulmonary exam normal breath sounds clear to auscultation       Cardiovascular hypertension, Pt. on medications + Peripheral Vascular Disease  + dysrhythmias Atrial Fibrillation + Valvular Problems/Murmurs AI  Rhythm:Regular Rate:Normal     Neuro/Psych negative neurological ROS  negative psych ROS   GI/Hepatic Neg liver ROS, GERD  Controlled,  Endo/Other  Hypothyroidism   Renal/GU negative Renal ROS  negative genitourinary   Musculoskeletal  (+) Arthritis , Osteoarthritis,    Abdominal   Peds  Hematology negative hematology ROS (+) anemia ,   Anesthesia Other Findings   Reproductive/Obstetrics negative OB ROS                            Anesthesia Physical Anesthesia Plan  ASA: III  Anesthesia Plan: General   Post-op Pain Management:    Induction: Intravenous  Airway Management Planned: Oral ETT  Additional Equipment:   Intra-op Plan:   Post-operative Plan: Extubation in OR  Informed Consent: I have reviewed the patients History and Physical, chart, labs and discussed the procedure including the risks, benefits and alternatives for the proposed anesthesia with the patient or authorized representative who has indicated his/her understanding and acceptance.   Dental advisory given  Plan Discussed with: CRNA  Anesthesia Plan Comments:         Anesthesia Quick Evaluation

## 2016-12-17 DIAGNOSIS — M4854XA Collapsed vertebra, not elsewhere classified, thoracic region, initial encounter for fracture: Secondary | ICD-10-CM | POA: Diagnosis not present

## 2016-12-17 NOTE — Progress Notes (Signed)
Pt doing well. Pt and daughter given D/C instructions with verbal understanding. Pt's IV was removed prior to D/C. Pt's incision is covered with bandaid and has no sign of infection. Pt D/C'd home via wheelchair @ [phone] per MD order. Pt is stable @ D/C and has no other needs at this time. Holli Humbles, RN

## 2016-12-17 NOTE — Discharge Summary (Signed)
Physician Discharge Summary  Patient ID: Yvonne [name] MRN: [id number] DOB/AGE: [birth year] 81 y.o.  Admit date: 12/16/2016 Discharge date: 12/17/2016  Admission Diagnoses:Osteoporosis and Thoracic 5 compression fracture  Discharge Diagnoses: Same Active Problems:   Thoracic compression fracture [facility])   Discharged Condition: good  Hospital Course: Patient underwent kyphoplasty of T 5 with good pain relief.  Consults: None  Significant Diagnostic Studies: None  Treatments: surgery: kyphoplasty of T 5  Discharge Exam: Blood pressure (!) 104/47, pulse 75, temperature 98.5 F (36.9 C), temperature source Oral, resp. rate 18, height 5\' 6"  (1.676 m), weight 55.3 kg (122 lb), SpO2 95 %. Neurologic: Alert and oriented X 3, normal strength and tone. Normal symmetric reflexes. Normal coordination and gait Wound:CDI   Disposition: Home  Discharge Instructions    Diet - low sodium heart healthy    Complete by:  As directed    Increase activity slowly    Complete by:  As directed      Allergies as of 12/17/2016   No Known Allergies     Medication List    TAKE these medications   acetaminophen 500 MG tablet Commonly known as:  TYLENOL Take 1,000 mg by mouth every 6 (six) hours as needed for moderate pain or headache.   amLODipine 5 MG tablet Commonly known as:  NORVASC TAKE 1 TABLET BY MOUTH  DAILY   amoxicillin 500 MG tablet Commonly known as:  AMOXIL Take 4 tablets (2gram) by mouth one hour prior to dental work.   aspirin EC 325 MG tablet Take 325 mg by mouth every evening.   calcium-vitamin D 500-200 MG-UNIT tablet Commonly known as:  OSCAL WITH D Take 1 tablet by mouth daily at 12 noon.   cholecalciferol 1000 units tablet Commonly known as:  VITAMIN D Take 2,000 Units by mouth daily at 12 noon.   flecainide 50 MG tablet Commonly known as:  TAMBOCOR Take one-half tablet by  mouth two times daily   levothyroxine 50 MCG tablet Commonly known as:  SYNTHROID,  LEVOTHROID TAKE 1 TABLET BY MOUTH  EVERY MORNING   losartan 50 MG tablet Commonly known as:  COZAAR Take 25-50 mg by mouth See admin instructions. Take 50 mg in the morning then take 25 mg at night   mirtazapine 15 MG disintegrating tablet Commonly known as:  REMERON SOL-TAB Take 1 tablet (15 mg total) by mouth at bedtime.   oxyCODONE 5 MG immediate release tablet Commonly known as:  Oxy IR/ROXICODONE Take 5 mg by mouth every 6 (six) hours as needed for moderate pain.        Signed: Peggyann Shoals, MD 12/17/2016, 7:09 AM

## 2016-12-17 NOTE — Progress Notes (Signed)
Subjective: Patient reports doing well  Objective: Vital signs in last 24 hours: Temp:  [97.3 F (36.3 C)-98.9 F (37.2 C)] 98.5 F (36.9 C) (04/21 0455) Pulse Rate:  [75-95] 75 (04/21 0455) Resp:  [13-22] 18 (04/21 0455) BP: (104-202)/(47-74) 104/47 (04/21 0455) SpO2:  [92 %-100 %] 95 % (04/21 0455) Weight:  [55.3 kg (122 lb)] 55.3 kg (122 lb) (04/20 0902)  Intake/Output from previous day: 04/20 0701 - 04/21 0700 In: 1620 [P.O.:720; I.V.:900] Out: 0  Intake/Output this shift: No intake/output data recorded.  Physical Exam: Strength full.  Dressing CDI.  Pain improved.  Lab Results:  Recent Labs  12/16/16 0917  WBC 6.4  HGB 12.1  HCT 38.1  PLT 147*   BMET  Recent Labs  12/16/16 0917  NA 138  K 4.4  CL 103  CO2 26  GLUCOSE 113*  BUN 20  CREATININE 0.94  CALCIUM 9.3    Studies/Results: Dg Thoracic Spine 2 View  Result Date: 12/16/2016 CLINICAL DATA:  Portable imaging provided four T5 kyphoplasty. EXAM: THORACIC SPINE 2 VIEWS; DG C-ARM 61-120 MIN COMPARISON:  Thoracic CT, 12/15/2016. Portable thoracic spine imaging dated 01/22/2016. FINDINGS: Two submitted images show intervertebral cement within the fractured T5 vertebra, above the previously stabilized T7 vertebra. The kyphoplasty cement appears well positioned. There is no evidence of a procedure complication. IMPRESSION: Imaging provided for T5 kyphoplasty. Electronically Signed   By: Lajean Manes M.D.   On: 12/16/2016 13:41   Ct Thoracic Spine Wo Contrast  Result Date: 12/15/2016 CLINICAL DATA:  T5 fracture with pain. Multiple previous back interventions. EXAM: CT THORACIC SPINE WITHOUT CONTRAST TECHNIQUE: Multidetector CT images of the thoracic were obtained using the standard protocol without intravenous contrast. COMPARISON:  Radiography 12/13/2016. CT 08/04/2016. Chest radiography 08/15/2016 and 08/17/2016 FINDINGS: Alignment: Normal Vertebrae: Previously augmented compression fractures at T7, T8 and L1  without further progression. Old healed T12 fracture without augmentation. Subacute appearing fracture at T5 with loss of height of 50%. Mild posterior bowing of the posterior margin of the vertebral body without frank retropulsion. This was present on the radiography of 2 days ago but not on the CT of 08/04/2016. The increased density and indistinctness of fracture lines suggests that this may be subacute/healing. Paraspinal and other soft tissues: Pleuroparenchymal scarring at the apices with calcified pleural plaques. Chronic dilated ascending aorta with graft repair. Maximal diameter 6.3 Cm. This is unchanged allowing for variation in measurement technique. Disc levels: No significant disc space finding at T8-9 or above. At T9-10, and T10-11, there is disc calcification and annular calcification with annular bulging. This indents the ventral subarachnoid space but probably does not cause neural compression. At T11-12, there is retropulsion of the posterosuperior corner of the T12 vertebral body related to the old fracture measuring 4 mm. No likely neural compression. IMPRESSION: Probably subacute/healing compression fracture at T5 with loss of height of 50%. Mild posterior bowing but no frank retropulsion or stenosis. Sclerotic density and indistinct fracture lines suggest that this is probably subacute and healing. Old healed augmented fractures at T7, T8 and L1. Old healed un augmented fracture at T12. Electronically Signed   By: Nelson Chimes M.D.   On: 12/15/2016 15:21   Dg C-arm 61-120 Min  Result Date: 12/16/2016 CLINICAL DATA:  Portable imaging provided four T5 kyphoplasty. EXAM: THORACIC SPINE 2 VIEWS; DG C-ARM 61-120 MIN COMPARISON:  Thoracic CT, 12/15/2016. Portable thoracic spine imaging dated 01/22/2016. FINDINGS: Two submitted images show intervertebral cement within the fractured T5 vertebra, above  the previously stabilized T7 vertebra. The kyphoplasty cement appears well positioned. There is no  evidence of a procedure complication. IMPRESSION: Imaging provided for T5 kyphoplasty. Electronically Signed   By: Lajean Manes M.D.   On: 12/16/2016 13:41    Assessment/Plan: Discharge home.    LOS: 0 days    Peggyann Shoals, MD 12/17/2016, 7:08 AM

## 2016-12-18 ENCOUNTER — Encounter (HOSPITAL_COMMUNITY): Payer: Self-pay | Admitting: Neurosurgery

## 2016-12-21 ENCOUNTER — Other Ambulatory Visit: Payer: Self-pay | Admitting: Cardiovascular Disease

## 2016-12-21 MED ORDER — AMLODIPINE BESYLATE 5 MG PO TABS: 5.0000 mg | ORAL_TABLET | Freq: Every day | ORAL | 1 refills | Status: DC

## 2016-12-21 MED ORDER — LOSARTAN POTASSIUM 50 MG PO TABS: 25.0000 mg | ORAL_TABLET | ORAL | 0 refills | Status: DC

## 2016-12-21 MED ORDER — LOSARTAN POTASSIUM 50 MG PO TABS: 25.0000 mg | ORAL_TABLET | ORAL | 1 refills | Status: DC

## 2016-12-21 MED ORDER — AMLODIPINE BESYLATE 5 MG PO TABS: 5.0000 mg | ORAL_TABLET | Freq: Every day | ORAL | 0 refills | Status: DC

## 2016-12-21 NOTE — Telephone Encounter (Signed)
Pt's medications was sent to pt's pharmacies as requested. Confirmation received from both.

## 2016-12-26 ENCOUNTER — Other Ambulatory Visit: Payer: Self-pay | Admitting: Cardiovascular Disease

## 2016-12-27 NOTE — Telephone Encounter (Signed)
REFILL 

## 2017-01-02 ENCOUNTER — Other Ambulatory Visit: Payer: Self-pay | Admitting: *Deleted

## 2017-01-02 ENCOUNTER — Telehealth: Payer: Self-pay | Admitting: Cardiovascular Disease

## 2017-01-02 ENCOUNTER — Encounter: Payer: Self-pay | Admitting: Cardiovascular Disease

## 2017-01-02 ENCOUNTER — Ambulatory Visit (INDEPENDENT_AMBULATORY_CARE_PROVIDER_SITE_OTHER): Payer: Medicare Other | Admitting: Cardiovascular Disease

## 2017-01-02 VITALS — BP 108/50 | HR 87 | Ht 66.0 in | Wt 120.0 lb

## 2017-01-02 DIAGNOSIS — Z79899 Other long term (current) drug therapy: Secondary | ICD-10-CM

## 2017-01-02 DIAGNOSIS — I471 Supraventricular tachycardia, unspecified: Secondary | ICD-10-CM

## 2017-01-02 DIAGNOSIS — I10 Essential (primary) hypertension: Secondary | ICD-10-CM

## 2017-01-02 DIAGNOSIS — I712 Thoracic aortic aneurysm, without rupture, unspecified: Secondary | ICD-10-CM

## 2017-01-02 DIAGNOSIS — I351 Nonrheumatic aortic (valve) insufficiency: Secondary | ICD-10-CM

## 2017-01-02 DIAGNOSIS — I441 Atrioventricular block, second degree: Secondary | ICD-10-CM

## 2017-01-02 DIAGNOSIS — I341 Nonrheumatic mitral (valve) prolapse: Secondary | ICD-10-CM

## 2017-01-02 DIAGNOSIS — I5033 Acute on chronic diastolic (congestive) heart failure: Secondary | ICD-10-CM

## 2017-01-02 MED ORDER — FUROSEMIDE 40 MG PO TABS: 40.0000 mg | ORAL_TABLET | Freq: Every day | ORAL | 0 refills | Status: DC

## 2017-01-02 MED ORDER — POTASSIUM CHLORIDE CRYS ER 20 MEQ PO TBCR: 20.0000 meq | EXTENDED_RELEASE_TABLET | Freq: Every day | ORAL | 3 refills | Status: DC

## 2017-01-02 MED ORDER — FLECAINIDE ACETATE 50 MG PO TABS: 25.0000 mg | ORAL_TABLET | Freq: Two times a day (BID) | ORAL | 3 refills | Status: AC

## 2017-01-02 MED ORDER — FUROSEMIDE 40 MG PO TABS: 40.0000 mg | ORAL_TABLET | Freq: Every day | ORAL | 3 refills | Status: DC

## 2017-01-02 MED ORDER — POTASSIUM CHLORIDE CRYS ER 20 MEQ PO TBCR: 20.0000 meq | EXTENDED_RELEASE_TABLET | Freq: Every day | ORAL | 0 refills | Status: DC

## 2017-01-02 MED ORDER — LOSARTAN POTASSIUM 50 MG PO TABS: ORAL_TABLET | ORAL | 3 refills | Status: DC

## 2017-01-02 MED ORDER — AMLODIPINE BESYLATE 5 MG PO TABS: 5.0000 mg | ORAL_TABLET | Freq: Every day | ORAL | 3 refills | Status: AC

## 2017-01-02 NOTE — Patient Instructions (Signed)
Medication Instructions: Dr Sallyanne Kuster has recommended making the following medication changes: 1. START Furosemide 40 mg - take 1 tablet by mouth daily 2. START Potassium 20 mEq - take 1 tablet by mouth daily  Labwork: Your physician recommends that you return for lab work in 1 week.  Testing/Procedures: NONE ORDERED  Follow-up: Your physician recommends that you schedule a follow-up appointment in 2 weeks with Bernerd Pho, PA.  Dr Sallyanne Kuster recommends that you schedule a follow-up appointment first available.  If you need a refill on your cardiac medications before your next appointment, please call your pharmacy.

## 2017-01-02 NOTE — Progress Notes (Signed)
Cardiology Office Note    Date:  01/04/2017   ID:  Yvonne [name], DOB Jan 02, 1926, MRN [id number]  PCP:  Midge Minium, MD  Cardiologist:   Sanda Klein, MD   Chief complaint: Dyspnea  History of Present Illness:  Yvonne [name] is a 83 y.o. female with a history of repaired rupture of the ascending aortic aneurysm, paroxysmal atrial tachycardia, mitral valve prolapse and second-degree AV block Mobitz type I. She has residual moderate aortic insufficiency following the aortic valve resuspension at the time of her aneurysm repair.  She has had multiple kyphoplasty is over the last few months due to recurrent osteoporosis related fractures. The kyphoplasty is routinely resolve the thoracic spine related pain, but she is becoming more short of breath. Her last kyphoplasty procedure was on T5 performed 2 weeks ago. She denies edema. She has mild orthostatic dizziness. She has not had syncope. She has developed NYHA functional class II-III  dyspnea. She denies angina and has not been troubled by palpitations.  In 2011 she had surgical repair of a ruptured ascending aortic aneurysm and resuspension of the native aortic valve. 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 Followed by Dr. Ceasar Mons. She had problems with paroxysmal atrial tachycardia and had a successful ablation by Dr. Thompson Grayer. Following that she was still troubled by frequent symptomatic PACs and continues to take treatment with flecainide. She has excellent symptom relief. Beta blockers had to be discontinued after she develops symptomatic second degree Mobitz type I AV block that led to syncope. Hypertension is controlled on a combination of ARB and dihydropyridine calcium channel blocker. She has not had atrial fibrillation.  Past Medical History:  Diagnosis Date  . Acute blood loss anemia 2011   WITH ANEURYSM ONLY  .  Afib ([place])    -no   . Arthritis   . Cancer ([place]) LATE 1970'S   skin cancer - arm squamous  . Compression fracture of body of thoracic vertebra ([place])    T 5  . Compression fracture of L2 lumbar vertebra ([facility])   . GERD (gastroesophageal reflux disease)    "mild"  . Heart murmur   . History of atrial paroxysmal tachycardia   . History of blood transfusion   . History of hypothyroidism   . Hypertension   . Hypothyroidism   . Mitral valve prolapse   . Moderate aortic insufficiency 05/25/2014  . Osteoporosis   . Pericardial tamponade 05/2010  . Pneumonia    many years ago  . Premature atrial contractions   . Syncope    VERY RARE  . Thoracic aortic aneurysm ([place]) 05/2010   Ruptured ascending  . UTI (lower urinary tract infection)    CHRONIC  . Volume overload 2011  . Wrist fracture    left    Past Surgical History:  Procedure Laterality Date  . ABLATION    . APPENDECTOMY    . BREAST SURGERY Right    right biospy  . Classic atrioventricular nodal reentrant  10/20/2010  . COLONOSCOPY    . EUS N/A 02/18/2016   Procedure: UPPER ENDOSCOPIC ULTRASOUND (EUS) LINEAR;  Surgeon: Milus Banister, MD;  Location: [facility];  Service: Endoscopy;  Laterality: N/A;  . EYE SURGERY Bilateral 2003   cataract bilateral  . HERNIA REPAIR  1993   Right Inguinal  . KYPHOPLASTY  09/05/2012   Procedure: KYPHOPLASTY;  Surgeon: Marylyn Ishihara  Osie Bond, MD;  Location: [facility];  Service: Neurosurgery;  Laterality: N/A;  L1 kyphoplasty  . KYPHOPLASTY N/A 04/01/2014   Procedure: KYPHOPLASTY;  Surgeon: Ashok Pall, MD;  Location: [facility];  Service: Neurosurgery;  Laterality: N/A;  Lumbar Four Kyphoplasty  . KYPHOPLASTY N/A 08/07/2015   Procedure: KYPHOPLASTY LUMBAR THREE;  Surgeon: Ashok Pall, MD;  Location: [facility];  Service: Neurosurgery;  Laterality: N/A;  . KYPHOPLASTY N/A 12/09/2015   Procedure: Lumbar five, KYPHOPLASTY;  Surgeon: Ashok Pall, MD;  Location: [facility];  Service:  Neurosurgery;  Laterality: N/A;  L5 kyphoplasty  . KYPHOPLASTY N/A 01/13/2016   Procedure: Thoracic seven KYPHOPLASTY;  Surgeon: Ashok Pall, MD;  Location: [facility];  Service: Neurosurgery;  Laterality: N/A;  . KYPHOPLASTY N/A 01/22/2016   Procedure: THORACIC EIGHT KYPHOPLASTY;  Surgeon: Ashok Pall, MD;  Location: [facility];  Service: Neurosurgery;  Laterality: N/A;  T8 KYPHOPLASTY  . KYPHOPLASTY N/A 12/16/2016   Procedure: KYPHOPLASTY THORACIC FIVE;  Surgeon: Ashok Pall, MD;  Location: [facility];  Service: Neurosurgery;  Laterality: N/A;  KYPHOPLASTY THORACIC 5  . L2 kyphoplasty using Kyphon system.  03/17/2009  . ORIF WRIST FRACTURE Left 08/17/2016   Procedure: OPEN REDUCTION INTERNAL FIXATION (ORIF) WRIST FRACTURE;  Surgeon: Roseanne Kaufman, MD;  Location: [facility];  Service: Orthopedics;  Laterality: Left;  block done as well   . PERCUTANEOUS PINNING WRIST FRACTURE Bilateral 1995   bil  . Replacement of ascending aorta and hemiarch and resuspension of the aortic valve with circulatory cardiopulmonary bypass and circulatory arrest  06/25/2010   Dr Servando Snare  . Revision of left total hip replacement using some allograft  11/23/2010  . TOE OSTEOTOMY     Right 2nd   . TONSILLECTOMY    . TUBAL LIGATION  1966    Current Medications: Outpatient Medications Prior to Visit  Medication Sig Dispense Refill  . acetaminophen (TYLENOL) 500 MG tablet Take 1,000 mg by mouth every 6 (six) hours as needed for moderate pain or headache.     Marland Kitchen amoxicillin (AMOXIL) 500 MG tablet Take 4 tablets (2gram) by mouth one hour prior to dental work. 20 tablet 0  . aspirin EC 325 MG tablet Take 325 mg by mouth every evening.     . calcium-vitamin D (OSCAL WITH D) 500-200 MG-UNIT per tablet Take 1 tablet by mouth daily at 12 noon.    . cholecalciferol (VITAMIN D) 1000 UNITS tablet Take 2,000 Units by mouth daily at 12 noon.     Marland Kitchen levothyroxine (SYNTHROID, LEVOTHROID) 50 MCG tablet TAKE 1 TABLET BY MOUTH  EVERY  MORNING 90 tablet 0  . mirtazapine (REMERON SOL-TAB) 15 MG disintegrating tablet Take 1 tablet (15 mg total) by mouth at bedtime. 30 tablet 3  . oxyCODONE (OXY IR/ROXICODONE) 5 MG immediate release tablet Take 5 mg by mouth every 6 (six) hours as needed for moderate pain.   0  . amLODipine (NORVASC) 5 MG tablet Take 1 tablet (5 mg total) by mouth daily. 90 tablet 1  . flecainide (TAMBOCOR) 50 MG tablet TAKE ONE-HALF TABLET BY  MOUTH TWO TIMES DAILY 90 tablet 0  . losartan (COZAAR) 50 MG tablet Take 0.5-1 tablets (25-50 mg total) by mouth See admin instructions. Take 50 mg in the morning then take 25 mg at night 135 tablet 1   No facility-administered medications prior to visit.      Allergies:   Patient has no known allergies.   Social History  Social History  . Marital status: Widowed    Spouse name: N/A  . Number of children: N/A  . Years of education: N/A   Social History Main Topics  . Smoking status: Never Smoker  . Smokeless tobacco: Never Used  . Alcohol use No  . Drug use: No  . Sexual activity: Not Asked   Other Topics Concern  . None   Social History Narrative  . None     Family History:  The patient's family history includes COPD in her mother; Heart failure in her mother; Hypertension in her brother, brother, sister, and sister; Stroke in her father and sister. She is one of 3 siblings from a group of 9 siblings that have aneurysms of the thoracic aorta. Her children have all had scans that did not show evidence of aneurysm.  ROS:   Please see the history of present illness.    ROS All other systems reviewed and are negative.   PHYSICAL EXAM:   VS:  BP (!) 108/50 (BP Location: Right Arm, Patient Position: Sitting, Cuff Size: Normal)   Pulse 87   Ht 5\' 6"  (1.676 m)   Wt 54.4 kg (120 lb)   BMI 19.37 kg/m     General: Alert, oriented x3, no distress. Very slender and somewhat frail-appearing Head: no evidence of trauma, PERRL, EOMI, no exophtalmos or lid  lag, no myxedema, no xanthelasma; normal ears, nose and oropharynx Neck: 6-7 centimeters elevation in jugular venous pulsations and prompt hepatojugular reflux; brisk carotid pulses without delay and no carotid bruits Chest: clear to auscultation, no signs of consolidation by percussion or palpation, normal fremitus, symmetrical and full respiratory excursions Cardiovascular: normal position and quality of the apical impulse, regular rhythm, normal first and second heart sounds, 1-2/6 early peaking aortic ejection murmur, 2/6 decrescendo diastolic murmur in the aortic focus, no apical murmurs, rubs or gallops. Midsystolic click is present. Abdomen: no tenderness or distention, no masses by palpation, no abnormal pulsatility or arterial bruits, normal bowel sounds, no hepatosplenomegaly Extremities: no clubbing, cyanosis or edema; 2+ radial, ulnar and brachial pulses bilaterally; 2+ right femoral, posterior tibial and dorsalis pedis pulses; 2+ left femoral, posterior tibial and dorsalis pedis pulses; no subclavian or femoral bruits Neurological: grossly nonfocal   Wt Readings from Last 3 Encounters:  01/02/17 54.4 kg (120 lb)  12/16/16 55.3 kg (122 lb)  12/06/16 55.3 kg (122 lb)      Studies/Labs Reviewed:   EKG:  EKG is ordered today.  The ekg ordered today demonstrates Sinus rhythm With first-degree AV block (PR interval 274 ms), QS pattern in leads V1-V2 and QTC 442 ms Recent Labs: 08/08/2016: ALT 8; TSH 1.65 12/16/2016: BUN 20; Creatinine, Ser 0.94; Hemoglobin 12.1; Platelets 147; Potassium 4.4; Sodium 138    ASSESSMENT:    1. Acute on chronic diastolic (congestive) heart failure ([place])   2. SVT (supraventricular tachycardia) ([place])   3. Second degree AV block   4. Thoracic aortic aneurysm without rupture ([place])   5. Essential hypertension   6. MVP (mitral valve prolapse)   7. Nonrheumatic aortic valve insufficiency   8. Medication management      PLAN:  In order of problems  listed above:  1. CHF: Echo in December showed findings suggestive of diastolic dysfunction and elevated filling pressures, but at that point she was not particularly dyspneic. Elevated jugular venous pulsations  suggest some degree of fluid overload. Will repeat echocardiogram to reassess aortic insufficiency and left ventricular function and filling pressures. The  recurrent vertebral compression fractures have probably had some impact on her thoracic mechanics and may be at least in part responsible for her shortness of breath. 2. SVT/PACs: Flecainide has offered excellent symptom relief and she wishes to continue this. We have discussed potential toxicity of this drug in her age group but so far no symptoms or ECG signs of flecainide toxicity. First-degree AV block is only concerning finding on her EKG 3. 2nd deg AVB: This was seen while he was taking beta blockers but has not returned since these were discontinued 4. Ao aneurysm: Multiple family members have this disorder and is likely genetic. Unable to take beta blockers due to symptomatic bradycardia. 5. HTN: Excellent control. Continue amlodipine and losartan 6. MVP: Previous echocardiograms have not shown mitral insufficiency, click is present on exam but I don't hear apical holosystolic murmur. Will reevaluate on echo done for aortic insufficiency anyway. 7. AI: Secondary to aorto annular ectasia and aortic valve resuspension surgery. Previously described as moderate. Most recent echo from June 2016 described the aortic insufficiency as mild. Need to reevaluate since she has developed significant shortness of breath. Unfortunately she would not be a candidate for redo thoracotomy and aortic valve replacement/repair.    Medication Adjustments/Labs and Tests Ordered: Current medicines are reviewed at length with the patient today.  Concerns regarding medicines are outlined above.  Medication changes, Labs and Tests ordered today are listed in  the Patient Instructions below. Patient Instructions  Medication Instructions: Dr Sallyanne Kuster has recommended making the following medication changes: 1. START Furosemide 40 mg - take 1 tablet by mouth daily 2. START Potassium 20 mEq - take 1 tablet by mouth daily  Labwork: Your physician recommends that you return for lab work in 1 week.  Testing/Procedures: NONE ORDERED  Follow-up: Your physician recommends that you schedule a follow-up appointment in 2 weeks with Bernerd Pho, PA.  Dr Sallyanne Kuster recommends that you schedule a follow-up appointment first available.  If you need a refill on your cardiac medications before your next appointment, please call your pharmacy.    Signed, Sanda Klein, MD  01/04/2017 6:50 PM    [facility] [address], [place], [place]  [address] Phone: [phone]; Fax: [phone]

## 2017-01-02 NOTE — Telephone Encounter (Signed)
New message     Pt needs to start these today , [facility] not [facility]  *STAT* If patient is at the pharmacy, call can be transferred to refill team.   1. Which medications need to be refilled? (please list name of each medication and dose if known)  Lasix and Pottassium   2. Which pharmacy/location (including street and city if local pharmacy) is medication to be sent to[facility] [place]   3. Do they need a 30 day or 90 day supply? [place]

## 2017-01-02 NOTE — Telephone Encounter (Signed)
JUST NEEDED A TEMP SUPPLY TO LOCAL PHARMACY, THIS HAS BEEN SENT, PT & DAUGHTER WHO CALLED IS AWARE.

## 2017-01-10 ENCOUNTER — Telehealth: Payer: Self-pay | Admitting: Cardiovascular Disease

## 2017-01-10 NOTE — Telephone Encounter (Signed)
New message     Pt c/o medication issue:  1. Name of Medication: lasix  2. How are you currently taking this medication (dosage and times per day)?  Daily 40 mg a day is too much  3. Are you having a reaction (difficulty breathing--STAT)? Doesn't feel well , dehydrated   4. What is your medication issue? Dehydrated , very weak

## 2017-01-10 NOTE — Telephone Encounter (Signed)
Spoke with Mal Misty (daughter) she states that lasix in her opinion is too strong for pt. She states that she went to her other doctor today and states that shis is what he thought too(I think that she said Neuro, but not sure) he said that he thought that pt was dehydrated from the lasix and that daughter should let us know. I spoke with pharmacist and she states that what we do typically is to cut lasix in half and weigh daily and see if lasix continues to work at lower dose typically we can keep pt at the lower dose. Informed daughter of this direction, she will make a log of pt's weights/trends and call us back if we is >3 lbs daily or >5 lbs in a week until her appt with Bernerd Pho 01-18-17 and discuss this at that time with her, she will bring wt log to this appt for review

## 2017-01-11 LAB — BASIC METABOLIC PANEL
BUN: 42 mg/dL — ABNORMAL HIGH (ref 7–25)
CO2: 27 mmol/L (ref 20–31)
Calcium: 9.4 mg/dL (ref 8.6–10.4)
Chloride: 99 mmol/L (ref 98–110)
Creat: 1.34 mg/dL — ABNORMAL HIGH (ref 0.60–0.88)
GLUCOSE: 109 mg/dL — AB (ref 65–99)
POTASSIUM: 4.3 mmol/L (ref 3.5–5.3)
SODIUM: 141 mmol/L (ref 135–146)

## 2017-01-11 NOTE — Telephone Encounter (Signed)
Agree with recommendations MCr

## 2017-01-17 NOTE — Progress Notes (Signed)
Cardiology Office Note    Date:  01/18/2017   ID:  Yvonne [name], DOB Apr 20, 1926, MRN [id number]  PCP:  Midge Minium, MD  Cardiologist: Dr. Sallyanne Kuster   Chief Complaint  Patient presents with  . Follow-up    2 weeks    History of Present Illness:    Yvonne [name] is a 83 y.o. female withy past medical history of ruptured AAA (s/p repair in 2011 with resuspension of the aortic valve), PAT (s/p ablation, on Flecainide), MVP, osteoporosis (s/p multiple kyphoplasty procedures), and 2nd degree AV Block Mobitz I who presents to the office today for 2-week follow-up.   She was last examined by Dr. Sallyanne Kuster on 01/02/2017 and reported worsening dyspnea on exertion. Prior echocardiograms had shown evidence of diastolic dysfunction, therefore a repeat echocardiogram was ordered (mentioned in notes but no order placed). She was started on Lasix 40mg  daily with K+ supplementation. Weight was 120 lbs at that time. Her family called the office on 5/15 due to concerns she was becoming dehydrated with the Lasix dosing, therefore this was reduced to 20 mg daily. Labs were checked and showed a slight bump in creatinine to 1.34 (at 0.94 previously).   In talking with the patient today, she reports that her breathing has been at baseline since taking her first dose of Lasix on 01/03/2017. She experienced significant urine output at that time but since then noticed a steady decline in her urination. She denies any recurrent episodes of dyspnea on exertion, orthopnea, PND, or lower extremity edema. She reports consuming potato chips regularly and feels like this might have caused her initial episode of dyspnea.  Since taking Lasix, even at 20 mg daily, she has felt weak and her daughter has noted a decreased appetite. She consumes minimal food at baseline and usually only has one cup of coffee in the morning and two Ensure drinks throughout the day.  Past Medical History:  Diagnosis Date  . Acute blood  loss anemia 2011   WITH ANEURYSM ONLY  . Afib ([place])    -no   . Arthritis   . Cancer ([place]) LATE 1970'S   skin cancer - arm squamous  . Compression fracture of body of thoracic vertebra ([place])    T 5  . Compression fracture of L2 lumbar vertebra ([facility])   . GERD (gastroesophageal reflux disease)    "mild"  . Heart murmur   . History of atrial paroxysmal tachycardia   . History of blood transfusion   . History of hypothyroidism   . Hypertension   . Hypothyroidism   . Mitral valve prolapse   . Moderate aortic insufficiency 05/25/2014  . Osteoporosis   . Pericardial tamponade 05/2010  . Pneumonia    many years ago  . Premature atrial contractions   . Syncope    VERY RARE  . Thoracic aortic aneurysm ([place]) 05/2010   Ruptured ascending  . UTI (lower urinary tract infection)    CHRONIC  . Volume overload 2011  . Wrist fracture    left    Past Surgical History:  Procedure Laterality Date  . ABLATION    . APPENDECTOMY    . BREAST SURGERY Right    right biospy  . Classic atrioventricular nodal reentrant  10/20/2010  . COLONOSCOPY    . EUS N/A 02/18/2016   Procedure: UPPER ENDOSCOPIC ULTRASOUND (EUS) LINEAR;  Surgeon: Milus Banister, MD;  Location: [facility];  Service: Endoscopy;  Laterality: N/A;  . EYE SURGERY Bilateral 2003  cataract bilateral  . HERNIA REPAIR  1993   Right Inguinal  . KYPHOPLASTY  09/05/2012   Procedure: KYPHOPLASTY;  Surgeon: Winfield Cunas, MD;  Location: [facility]UR[facility] OR[facility];  Service: Neurosurgery;  Laterality: N/A;  L1 kyphoplasty  . KYPHOPLASTY N/A 04/01/2014   Procedure: KYPHOPLASTY;  Surgeon: Ashok Pall, MD;  Location: [facility]UR[facility] ORS;  Service: Neurosurgery;  Laterality: N/A;  Lumbar Four Kyphoplasty  . KYPHOPLASTY N/A 08/07/2015   Procedure: KYPHOPLASTY LUMBAR THREE;  Surgeon: Ashok Pall, MD;  Location: [facility] NEUR[facility] ORS;  Service: Neurosurgery;  Laterality: N/A;  . KYPHOPLASTY N/A 12/09/2015   Procedure: Lumbar five, KYPHOPLASTY;  Surgeon: Ashok Pall, MD;   Location: [facility] NEUR[facility] ORS;  Service: Neurosurgery;  Laterality: N/A;  L5 kyphoplasty  . KYPHOPLASTY N/A 01/13/2016   Procedure: Thoracic seven KYPHOPLASTY;  Surgeon: Ashok Pall, MD;  Location: [facility] NEURO ORS;  Service: Neurosurgery;  Laterality: N/A;  . KYPHOPLASTY N/A 01/22/2016   Procedure: THORACIC EIGHT KYPHOPLASTY;  Surgeon: Ashok Pall, MD;  Location: [facility] NEURO ORS;  Service: Neurosurgery;  Laterality: N/A;  T8 KYPHOPLASTY  . KYPHOPLASTY N/A 12/16/2016   Procedure: KYPHOPLASTY THORACIC FIVE;  Surgeon: Ashok Pall, MD;  Location: [facility];  Service: Neurosurgery;  Laterality: N/A;  KYPHOPLASTY THORACIC 5  . L2 kyphoplasty using Kyphon system.  03/17/2009  . ORIF WRIST FRACTURE Left 08/17/2016   Procedure: OPEN REDUCTION INTERNAL FIXATION (ORIF) WRIST FRACTURE;  Surgeon: Roseanne Kaufman, MD;  Location: [facility];  Service: Orthopedics;  Laterality: Left;  block done as well   . PERCUTANEOUS PINNING WRIST FRACTURE Bilateral 1995   bil  . Replacement of ascending aorta and hemiarch and resuspension of the aortic valve with circulatory cardiopulmonary bypass and circulatory arrest  06/25/2010   Dr Servando Snare  . Revision of left total hip replacement using some allograft  11/23/2010  . TOE OSTEOTOMY     Right 2nd   . TONSILLECTOMY    . TUBAL LIGATION  1966    Current Medications: Outpatient Medications Prior to Visit  Medication Sig Dispense Refill  . acetaminophen (TYLENOL) 500 MG tablet Take 1,000 mg by mouth every 6 (six) hours as needed for moderate pain or headache.     Marland Kitchen amLODipine (NORVASC) 5 MG tablet Take 1 tablet (5 mg total) by mouth daily. 90 tablet 3  . amoxicillin (AMOXIL) 500 MG tablet Take 4 tablets (2gram) by mouth one hour prior to dental work. 20 tablet 0  . aspirin EC 325 MG tablet Take 325 mg by mouth every evening.     . calcium-vitamin D (OSCAL WITH D) 500-200 MG-UNIT per tablet Take 1 tablet by mouth daily at 12 noon.    . cholecalciferol (VITAMIN D) 1000 UNITS tablet Take  2,000 Units by mouth daily at 12 noon.     . flecainide (TAMBOCOR) 50 MG tablet Take 0.5 tablets (25 mg total) by mouth 2 (two) times daily. 90 tablet 3  . levothyroxine (SYNTHROID, LEVOTHROID) 50 MCG tablet TAKE 1 TABLET BY MOUTH  EVERY MORNING 90 tablet 0  . losartan (COZAAR) 50 MG tablet Take 1 tablet (50 mg total) by mouth in the morning then take 0.5 tablet (25 mg total) by mouth at night. 135 tablet 3  . mirtazapine (REMERON SOL-TAB) 15 MG disintegrating tablet Take 1 tablet (15 mg total) by mouth at bedtime. 30 tablet 3  . oxyCODONE (OXY IR/ROXICODONE) 5 MG immediate release tablet Take 5 mg by mouth every 6 (six) hours as needed for moderate pain.   0  .  furosemide (LASIX) 40 MG tablet Take 1 tablet (40 mg total) by mouth daily. (Patient taking differently: Take 20 mg by mouth daily. ) 14 tablet 0  . potassium chloride SA (KLOR-CON M20) 20 MEQ tablet Take 1 tablet (20 mEq total) by mouth daily. 14 tablet 0   No facility-administered medications prior to visit.      Allergies:   Patient has no known allergies.   Social History   Social History  . Marital status: Widowed    Spouse name: N/A  . Number of children: N/A  . Years of education: N/A   Social History Main Topics  . Smoking status: Never Smoker  . Smokeless tobacco: Never Used  . Alcohol use No  . Drug use: No  . Sexual activity: Not Asked   Other Topics Concern  . None   Social History Narrative  . None     Family History:  The patient's family history includes COPD in her mother; Heart failure in her mother; Hypertension in her brother, brother, sister, and sister; Stroke in her father and sister.   Review of Systems:   Please see the history of present illness.     General:  No chills, fever, night sweats or weight changes. Positive for fatigue.  Cardiovascular:  No chest pain, dyspnea on exertion, edema, orthopnea, palpitations, paroxysmal nocturnal dyspnea. Dermatological: No rash,  lesions/masses Respiratory: No cough, dyspnea Urologic: No hematuria, dysuria Abdominal:   No nausea, vomiting, diarrhea, bright red blood per rectum, melena, or hematemesis Neurologic:  No visual changes, wkns, changes in mental status. All other systems reviewed and are otherwise negative except as noted above.   Physical Exam:    VS:  BP (!) 112/54   Pulse 80   Ht 5\' 6"  (1.676 m)   Wt 119 lb (54 kg)   BMI 19.21 kg/m    General: Well developed, elderly Caucasian female appearing in no acute distress. Head: Normocephalic, atraumatic, sclera non-icteric, no xanthomas, nares are without discharge.  Neck: No carotid bruits. JVD not elevated.  Lungs: Respirations regular and unlabored, without wheezes or rales.  Heart: Regular rate and rhythm. No S3 or S4.  No rubs, or gallops appreciated. 2/6 SEM along RUSB.  Abdomen: Soft, non-tender, non-distended with normoactive bowel sounds. No hepatomegaly. No rebound/guarding. No obvious abdominal masses. Msk:  Strength and tone appear normal for age. No joint deformities or effusions. Extremities: No clubbing or cyanosis. No lower extremity edema.  Distal pedal pulses are 2+ bilaterally. Neuro: Alert and oriented X 3. Moves all extremities spontaneously. No focal deficits noted. Psych:  Responds to questions appropriately with a normal affect. Skin: No rashes or lesions noted  Wt Readings from Last 3 Encounters:  01/18/17 119 lb (54 kg)  01/02/17 120 lb (54.4 kg)  12/16/16 122 lb (55.3 kg)     Studies/Labs Reviewed:   EKG:  EKG is not ordered today.   Recent Labs: 08/08/2016: ALT 8; TSH 1.65 12/16/2016: Hemoglobin 12.1; Platelets 147 01/10/2017: BUN 42; Creat 1.34; Potassium 4.3; Sodium 141   Lipid Panel    Component Value Date/Time   CHOL 162 08/08/2016 1400   TRIG 85.0 08/08/2016 1400   HDL 86.00 08/08/2016 1400   CHOLHDL 2 08/08/2016 1400   VLDL 17.0 08/08/2016 1400   LDLCALC 59 08/08/2016 1400    Additional studies/  records that were reviewed today include:   Echocardiogram: 07/2016 Study Conclusions  - Left ventricle: The cavity size was normal. There was mild   concentric hypertrophy.  Systolic function was normal. The   estimated ejection fraction was in the range of 60% to 65%. Wall   motion was normal; there were no regional wall motion   abnormalities. Features are consistent with a pseudonormal left   ventricular filling pattern, with concomitant abnormal relaxation   and increased filling pressure (grade 2 diastolic dysfunction).   Doppler parameters are consistent with high ventricular filling   pressure. - Aortic valve: Transvalvular velocity was within the normal range.   There was no stenosis. There was moderate regurgitation. Aortic   regurgitation appears more severe visually. However, given the   pressure half time >200 ms and lack of flow reversal in the   aorta, it is unlikely to be severe. Valve area (VTI): 1.67 cm^2.   Valve area (Vmax): 1.61 cm^2. Valve area (Vmean): 1.57 cm^2.   Regurgitation pressure half-time: 418 ms. - Mitral valve: Calcified annulus. Transvalvular velocity was   within the normal range. There was no evidence for stenosis.   There was trivial regurgitation. - Right ventricle: The cavity size was normal. Wall thickness was   normal. Systolic function was normal. - Right atrium: The atrium was moderately dilated. - Atrial septum: No defect or patent foramen ovale was identified   by color flow Doppler. - Tricuspid valve: There was mild regurgitation.   Assessment:    1. Dyspnea on exertion   2. Ruptured aneurysm of thoracic aorta ([place])   3. SVT (supraventricular tachycardia) ([place])   4. Aortic valve insufficiency, etiology of cardiac valve disease unspecified   5. MVP (mitral valve prolapse)      Plan:   In order of problems listed above:  1. Dyspnea on Exertion - previously evaluated in the office on 01/02/2017 and reported worsening dyspnea on  exertion. She was started on Lasix 40mg  daily with K+ supplementation and experienced significant urine output the first day with resolution of her symptoms. Lasix has since been reduced to 20mg  daily in the setting of dehydration. - in talking with the patient and her daughter today, they are both concerned that she is becoming dehydrated as it sounds like she consumes less than 30 ounces of fluid per day and has a decreased appetite. Since starting Lasix, her appetite has further decreased and she has complained of worsening weakness. - will change daily Lasix dosing to 20mg  daily PRN for worsening dyspnea, > 3 lb weight gain overnight or > 5 lbs in one week as she does appear dry on examination and I am concerned this will worsen if she remains on daily dosing in the setting of her poor PO intake. She lives with her daughter who will check her weights daily. Continue with plans for a repeat echocardiogram to assess her EF and valve function.  2. History of AAA - s/p repair in 2011 with aortic valve resuspension at that time. - CTA in 07/2016 showedsStable dilatation of the ascending thoracic aorta, status post graft repair with no evidence of dissection and no significant change since prior studies.  3. SVT/ PAC's - she denies any recent palpitations.  - she remains on Flecainide 25mg  BID (granted CAD noted on prior CTA and she is aware of the increased risk of this per previous discussions with Dr. Sallyanne Kuster). Not on BB therapy secondary to history of 2nd Degree AV Block with BB's in the past.   4. Aortic Insufficiency/ MVP - echo in 07/2016 showed moderate AI and trivial MR.  - plan for repeat echocardiogram as above.  Medication Adjustments/Labs and Tests Ordered: Current medicines are reviewed at length with the patient today.  Concerns regarding medicines are outlined above.  Medication changes, Labs and Tests ordered today are listed in the Patient Instructions below. Patient Instructions   Medication Instructions: Please take 20 mg Lasix if weight gain over 3 pounds overnight or 5 pounds in one week. If you need to take the Lasix please take 20 mg potassium pill daily. (only when taking the Lasix)  Procedures/Testing: Your physician has requested that you have an echocardiogram. Echocardiography is a painless test that uses sound waves to create images of your heart. It provides your doctor with information about the size and shape of your heart and how well your heart's chambers and valves are working. This procedure takes approximately one hour. There are no restrictions for this procedure. This will be done at [address]  Follow-Up: Please keep your follow up with Dr. Sallyanne Kuster.  If you need a refill on your cardiac medications before your next appointment, please call your pharmacy.    Signed, Erma Heritage, PA-C  01/18/2017 8:28 PM    [address], [place], [place]  [address] Phone: [phone]; Fax: [phone]  [address], [place], [place] [address] Phone: [phone]

## 2017-01-18 ENCOUNTER — Encounter: Payer: Self-pay | Admitting: Student

## 2017-01-18 ENCOUNTER — Ambulatory Visit (INDEPENDENT_AMBULATORY_CARE_PROVIDER_SITE_OTHER): Payer: Medicare Other | Admitting: Student

## 2017-01-18 VITALS — BP 112/54 | HR 80 | Ht 66.0 in | Wt 119.0 lb

## 2017-01-18 DIAGNOSIS — I351 Nonrheumatic aortic (valve) insufficiency: Secondary | ICD-10-CM | POA: Diagnosis not present

## 2017-01-18 DIAGNOSIS — R0609 Other forms of dyspnea: Secondary | ICD-10-CM

## 2017-01-18 DIAGNOSIS — I471 Supraventricular tachycardia, unspecified: Secondary | ICD-10-CM

## 2017-01-18 DIAGNOSIS — I341 Nonrheumatic mitral (valve) prolapse: Secondary | ICD-10-CM

## 2017-01-18 DIAGNOSIS — I711 Thoracic aortic aneurysm, ruptured, unspecified: Secondary | ICD-10-CM

## 2017-01-18 MED ORDER — POTASSIUM CHLORIDE CRYS ER 20 MEQ PO TBCR: 20.0000 meq | EXTENDED_RELEASE_TABLET | Freq: Every day | ORAL | 5 refills | Status: DC | PRN

## 2017-01-18 MED ORDER — FUROSEMIDE 20 MG PO TABS: 20.0000 mg | ORAL_TABLET | Freq: Every day | ORAL | 5 refills | Status: DC | PRN

## 2017-01-18 NOTE — Patient Instructions (Signed)
Medication Instructions: Please take 20 mg Lasix if weight gain over 3 pounds overnight or 5 pounds in one week. If you need to take the Lasix please take 20 mg potassium pill daily. (only when taking the Lasix)   Procedures/Testing: Your physician has requested that you have an echocardiogram. Echocardiography is a painless test that uses sound waves to create images of your heart. It provides your doctor with information about the size and shape of your heart and how well your heart's chambers and valves are working. This procedure takes approximately one hour. There are no restrictions for this procedure. This will be done at [address]   Follow-Up: Please keep your follow up with Dr. Sallyanne Kuster.    If you need a refill on your cardiac medications before your next appointment, please call your pharmacy.

## 2017-01-19 NOTE — Progress Notes (Signed)
Thank you. Even 20 mg is too much for a tiny lady, sometimes MCr.

## 2017-02-02 ENCOUNTER — Ambulatory Visit (HOSPITAL_COMMUNITY): Payer: Medicare Other | Attending: Cardiology

## 2017-02-02 ENCOUNTER — Other Ambulatory Visit: Payer: Self-pay

## 2017-02-02 DIAGNOSIS — I351 Nonrheumatic aortic (valve) insufficiency: Secondary | ICD-10-CM

## 2017-02-02 DIAGNOSIS — R0609 Other forms of dyspnea: Secondary | ICD-10-CM

## 2017-02-02 DIAGNOSIS — I348 Other nonrheumatic mitral valve disorders: Secondary | ICD-10-CM | POA: Insufficient documentation

## 2017-02-06 ENCOUNTER — Other Ambulatory Visit: Payer: Self-pay | Admitting: General Practice

## 2017-02-06 MED ORDER — LEVOTHYROXINE SODIUM 50 MCG PO TABS: 50.0000 ug | ORAL_TABLET | Freq: Every morning | ORAL | 0 refills | Status: DC

## 2017-02-06 NOTE — Telephone Encounter (Signed)
Last OV 08/08/16 Levothyroxine last filled 11/08/16 #90 with 0

## 2017-02-13 ENCOUNTER — Ambulatory Visit (INDEPENDENT_AMBULATORY_CARE_PROVIDER_SITE_OTHER): Payer: Medicare Other | Admitting: Family Medicine

## 2017-02-13 ENCOUNTER — Encounter: Payer: Self-pay | Admitting: Family Medicine

## 2017-02-13 VITALS — BP 121/72 | HR 83 | Temp 98.1°F | Resp 16 | Ht 66.0 in | Wt 122.5 lb

## 2017-02-13 DIAGNOSIS — I10 Essential (primary) hypertension: Secondary | ICD-10-CM

## 2017-02-13 DIAGNOSIS — E440 Moderate protein-calorie malnutrition: Secondary | ICD-10-CM | POA: Diagnosis not present

## 2017-02-13 LAB — CBC WITH DIFFERENTIAL/PLATELET
BASOS ABS: 0 10*3/uL (ref 0.0–0.1)
BASOS PCT: 0.6 % (ref 0.0–3.0)
EOS ABS: 0.1 10*3/uL (ref 0.0–0.7)
Eosinophils Relative: 1.6 % (ref 0.0–5.0)
HEMATOCRIT: 41.1 % (ref 36.0–46.0)
HEMOGLOBIN: 13.5 g/dL (ref 12.0–15.0)
LYMPHS PCT: 24.7 % (ref 12.0–46.0)
Lymphs Abs: 1.4 10*3/uL (ref 0.7–4.0)
MCHC: 32.8 g/dL (ref 30.0–36.0)
MCV: 92.5 fl (ref 78.0–100.0)
MONO ABS: 0.4 10*3/uL (ref 0.1–1.0)
Monocytes Relative: 7.5 % (ref 3.0–12.0)
Neutro Abs: 3.8 10*3/uL (ref 1.4–7.7)
Neutrophils Relative %: 65.6 % (ref 43.0–77.0)
Platelets: 155 10*3/uL (ref 150.0–400.0)
RBC: 4.44 Mil/uL (ref 3.87–5.11)
RDW: 15.3 % (ref 11.5–15.5)
WBC: 5.8 10*3/uL (ref 4.0–10.5)

## 2017-02-13 LAB — TSH: TSH: 1.33 u[IU]/mL (ref 0.35–4.50)

## 2017-02-13 LAB — LIPID PANEL
Cholesterol: 157 mg/dL (ref 0–200)
HDL: 74.1 mg/dL (ref >39.00)
LDL Cholesterol: 61 mg/dL (ref 0–99)
NonHDL: 82.64
TRIGLYCERIDES: 110 mg/dL (ref 0.0–149.0)
Total CHOL/HDL Ratio: 2
VLDL: 22 mg/dL (ref 0.0–40.0)

## 2017-02-13 LAB — BASIC METABOLIC PANEL
BUN: 25 mg/dL — AB (ref 6–23)
CALCIUM: 9.9 mg/dL (ref 8.4–10.5)
CO2: 27 mEq/L (ref 19–32)
CREATININE: 0.91 mg/dL (ref 0.40–1.20)
Chloride: 104 mEq/L (ref 96–112)
GFR: 61.92 mL/min (ref >60.00)
GLUCOSE: 88 mg/dL (ref 70–99)
Potassium: 4.7 mEq/L (ref 3.5–5.1)
Sodium: 140 mEq/L (ref 135–145)

## 2017-02-13 LAB — HEPATIC FUNCTION PANEL
ALBUMIN: 4.2 g/dL (ref 3.5–5.2)
ALT: 9 U/L (ref 0–35)
AST: 19 U/L (ref 0–37)
Alkaline Phosphatase: 111 U/L (ref 39–117)
Bilirubin, Direct: 0.2 mg/dL (ref 0.0–0.3)
TOTAL PROTEIN: 6.8 g/dL (ref 6.0–8.3)
Total Bilirubin: 1.1 mg/dL (ref 0.2–1.2)

## 2017-02-13 NOTE — Progress Notes (Signed)
Pre visit review using our clinic review tool, if applicable. No additional management support is needed unless otherwise documented below in the visit note. 

## 2017-02-13 NOTE — Patient Instructions (Signed)
Schedule your complete physical in 6 months and your Medicare Wellness visit w/ Yvonne [name] at the same time [facility] notify you of your lab results and make any changes if needed Continue to eat regularly Add fiber to your diet to bulk your stools Call with any questions or concerns Have a great summer!!!

## 2017-02-13 NOTE — Progress Notes (Signed)
   Subjective:    Patient ID: Yvonne [name], female    DOB: 11-23-1925, 83 y.o.   MRN: [id number]  HPI HTN- chronic problem.  On Norvasc and Losartan daily w/ good control.  No CP, SOB, HAs, visual changes, edema.  Malnourished- pt has gained 3 lbs since last visit on 5/23.  Continues to eat regularly- eating breakfast and dinner w/ snacks throughout the day.  Drinking Ensure and Coconut water.   Pt feels strength is slowly coming back.   Review of Systems For ROS see HPI     Objective:   Physical Exam  Constitutional: She is oriented to person, place, and time. She appears well-developed. No distress.  Thin, frail appearing  HENT:  Head: Normocephalic and atraumatic.  Eyes: Conjunctivae and EOM are normal. Pupils are equal, round, and reactive to light.  Neck: Normal range of motion. Neck supple. No thyromegaly present.  Cardiovascular: Normal rate, regular rhythm and intact distal pulses.   Murmur (I-II/VI SEM) heard. Pulmonary/Chest: Effort normal and breath sounds normal. No respiratory distress.  Abdominal: Soft. She exhibits no distension. There is no tenderness.  Musculoskeletal: She exhibits no edema.  Lymphadenopathy:    She has no cervical adenopathy.  Neurological: She is alert and oriented to person, place, and time.  Skin: Skin is warm and dry.  Psychiatric: She has a normal mood and affect. Her behavior is normal.  Vitals reviewed.         Assessment & Plan:

## 2017-02-13 NOTE — Assessment & Plan Note (Signed)
Chronic problem.  Well controlled today.  Asymptomatic.  Check labs.  No anticipated med changes.  Will follow. 

## 2017-02-13 NOTE — Assessment & Plan Note (Signed)
Ongoing issue.  Pt is eating 2 meals daily w/ snacks throughout the day.  Drinking Ensure periodically.  Encouraged increased Ensure, adding fiber.  Will follow.

## 2017-03-31 ENCOUNTER — Encounter: Payer: Self-pay | Admitting: Cardiovascular Disease

## 2017-03-31 ENCOUNTER — Ambulatory Visit (INDEPENDENT_AMBULATORY_CARE_PROVIDER_SITE_OTHER): Payer: Medicare Other | Admitting: Cardiovascular Disease

## 2017-03-31 VITALS — BP 104/50 | HR 75 | Ht 66.0 in | Wt 118.2 lb

## 2017-03-31 DIAGNOSIS — I351 Nonrheumatic aortic (valve) insufficiency: Secondary | ICD-10-CM | POA: Diagnosis not present

## 2017-03-31 DIAGNOSIS — I471 Supraventricular tachycardia, unspecified: Secondary | ICD-10-CM

## 2017-03-31 DIAGNOSIS — I441 Atrioventricular block, second degree: Secondary | ICD-10-CM | POA: Diagnosis not present

## 2017-03-31 DIAGNOSIS — I712 Thoracic aortic aneurysm, without rupture, unspecified: Secondary | ICD-10-CM

## 2017-03-31 DIAGNOSIS — I10 Essential (primary) hypertension: Secondary | ICD-10-CM | POA: Diagnosis not present

## 2017-03-31 DIAGNOSIS — I5032 Chronic diastolic (congestive) heart failure: Secondary | ICD-10-CM | POA: Diagnosis not present

## 2017-03-31 DIAGNOSIS — I341 Nonrheumatic mitral (valve) prolapse: Secondary | ICD-10-CM

## 2017-03-31 DIAGNOSIS — Z79899 Other long term (current) drug therapy: Secondary | ICD-10-CM

## 2017-03-31 DIAGNOSIS — Z5181 Encounter for therapeutic drug level monitoring: Secondary | ICD-10-CM

## 2017-03-31 MED ORDER — LOSARTAN POTASSIUM 50 MG PO TABS: 50.0000 mg | ORAL_TABLET | Freq: Every day | ORAL | 3 refills | Status: DC

## 2017-03-31 NOTE — Patient Instructions (Signed)
Dr Sallyanne Kuster has recommended making the following medication changes: 1. STOP EVENING DOSE OF LOSARTAN - Continue 1 tablet by mouth daily  Your physician recommends that you schedule a follow-up appointment in 6 months. You will receive a reminder letter in the mail two months in advance. If you don't receive a letter, please call our office to schedule the follow-up appointment.  If you need a refill on your cardiac medications before your next appointment, please call your pharmacy.

## 2017-03-31 NOTE — Progress Notes (Signed)
Cardiology Office Note    Date:  03/31/2017   ID:  Yvonne [name], DOB 18-Oct-1925, MRN [id number]  PCP:  Yvonne Minium, MD  Cardiologist:   Sanda Klein, MD   Chief complaint: Dyspnea  History of Present Illness:  Yvonne [name] is a 83 y.o. female with a history of repaired rupture of the ascending aortic aneurysm, paroxysmal atrial tachycardia, mitral valve prolapse and second-degree AV block Mobitz type I. She has residual moderate aortic insufficiency following the aortic valve resuspension at the time of her aneurysm repair.  She is doing better compared to last appointment. Her back pain is better controlled. She is sleeping better at night and has actually gained about 4 pounds of weight. She continues to complain of moderate exertional dyspnea. She has had multiple kyphoplasty procedures performed over the last year. She has not had problems with orthostatic dizziness or syncope. Denies angina and has not had palpitations. She often feels weak first in the morning, gather his energy and feels well by noontime.  Several weeks ago she took a single dose of Lasix and this made her feel worse. She has not taken any since.  Follow-up echo June 2015 shows no worsening of the moderate aortic insufficiency, normal left ventricular size and systolic function, evidence of diastolic dysfunction with equivocal evidence of fluid overload.  In 2011 she had surgical repair of a ruptured ascending aortic aneurysm and resuspension of the native aortic valve. 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 Followed by Dr. Ceasar Mons. She had problems with paroxysmal atrial tachycardia and had a successful ablation by Dr. Thompson Grayer. Following that she was still troubled by frequent symptomatic PACs and continues to take treatment with flecainide. She has excellent symptom relief. Beta blockers had to  be discontinued after she develops symptomatic second degree Mobitz type I AV block that led to syncope. Hypertension is controlled on a combination of ARB and dihydropyridine calcium channel blocker. She has not had atrial fibrillation.  Past Medical History:  Diagnosis Date  . Acute blood loss anemia 2011   WITH ANEURYSM ONLY  . Afib ([place])    -no   . Arthritis   . Cancer ([place]) LATE 1970'S   skin cancer - arm squamous  . Compression fracture of body of thoracic vertebra ([place])    T 5  . Compression fracture of L2 lumbar vertebra ([facility])   . GERD (gastroesophageal reflux disease)    "mild"  . Heart murmur   . History of atrial paroxysmal tachycardia   . History of blood transfusion   . History of hypothyroidism   . Hypertension   . Hypothyroidism   . Mitral valve prolapse   . Moderate aortic insufficiency 05/25/2014  . Osteoporosis   . Pericardial tamponade 05/2010  . Pneumonia    many years ago  . Premature atrial contractions   . Syncope    VERY RARE  . Thoracic aortic aneurysm ([place]) 05/2010   Ruptured ascending  . UTI (lower urinary tract infection)    CHRONIC  . Volume overload 2011  . Wrist fracture    left    Past Surgical History:  Procedure Laterality Date  . ABLATION    . APPENDECTOMY    . BREAST SURGERY Right    right biospy  . Classic atrioventricular nodal reentrant  10/20/2010  . COLONOSCOPY    . EUS N/A 02/18/2016  Procedure: UPPER ENDOSCOPIC ULTRASOUND (EUS) LINEAR;  Surgeon: Milus Banister, MD;  Location: [facility];  Service: Endoscopy;  Laterality: N/A;  . EYE SURGERY Bilateral 2003   cataract bilateral  . HERNIA REPAIR  1993   Right Inguinal  . KYPHOPLASTY  09/05/2012   Procedure: KYPHOPLASTY;  Surgeon: Winfield Cunas, MD;  Location: [facility];  Service: Neurosurgery;  Laterality: N/A;  L1 kyphoplasty  . KYPHOPLASTY N/A 04/01/2014   Procedure: KYPHOPLASTY;  Surgeon: Ashok Pall, MD;  Location: [facility];  Service: Neurosurgery;   Laterality: N/A;  Lumbar Four Kyphoplasty  . KYPHOPLASTY N/A 08/07/2015   Procedure: KYPHOPLASTY LUMBAR THREE;  Surgeon: Ashok Pall, MD;  Location: [facility];  Service: Neurosurgery;  Laterality: N/A;  . KYPHOPLASTY N/A 12/09/2015   Procedure: Lumbar five, KYPHOPLASTY;  Surgeon: Ashok Pall, MD;  Location: [facility];  Service: Neurosurgery;  Laterality: N/A;  L5 kyphoplasty  . KYPHOPLASTY N/A 01/13/2016   Procedure: Thoracic seven KYPHOPLASTY;  Surgeon: Ashok Pall, MD;  Location: [facility];  Service: Neurosurgery;  Laterality: N/A;  . KYPHOPLASTY N/A 01/22/2016   Procedure: THORACIC EIGHT KYPHOPLASTY;  Surgeon: Ashok Pall, MD;  Location: [facility];  Service: Neurosurgery;  Laterality: N/A;  T8 KYPHOPLASTY  . KYPHOPLASTY N/A 12/16/2016   Procedure: KYPHOPLASTY THORACIC FIVE;  Surgeon: Ashok Pall, MD;  Location: [facility];  Service: Neurosurgery;  Laterality: N/A;  KYPHOPLASTY THORACIC 5  . L2 kyphoplasty using Kyphon system.  03/17/2009  . ORIF WRIST FRACTURE Left 08/17/2016   Procedure: OPEN REDUCTION INTERNAL FIXATION (ORIF) WRIST FRACTURE;  Surgeon: Roseanne Kaufman, MD;  Location: [facility];  Service: Orthopedics;  Laterality: Left;  block done as well   . PERCUTANEOUS PINNING WRIST FRACTURE Bilateral 1995   bil  . Replacement of ascending aorta and hemiarch and resuspension of the aortic valve with circulatory cardiopulmonary bypass and circulatory arrest  06/25/2010   Dr Servando Snare  . Revision of left total hip replacement using some allograft  11/23/2010  . TOE OSTEOTOMY     Right 2nd   . TONSILLECTOMY    . TUBAL LIGATION  1966    Current Medications: Outpatient Medications Prior to Visit  Medication Sig Dispense Refill  . amLODipine (NORVASC) 5 MG tablet Take 1 tablet (5 mg total) by mouth daily. 90 tablet 3  . amoxicillin (AMOXIL) 500 MG tablet Take 4 tablets (2gram) by mouth one hour prior to dental work. 20 tablet 0  . aspirin EC 325 MG tablet Take 325 mg by mouth every  evening.     . calcium-vitamin D (OSCAL WITH D) 500-200 MG-UNIT per tablet Take 1 tablet by mouth daily at 12 noon.    . cholecalciferol (VITAMIN D) 1000 UNITS tablet Take 2,000 Units by mouth daily at 12 noon.     . flecainide (TAMBOCOR) 50 MG tablet Take 0.5 tablets (25 mg total) by mouth 2 (two) times daily. 90 tablet 3  . levothyroxine (SYNTHROID, LEVOTHROID) 50 MCG tablet Take 1 tablet (50 mcg total) by mouth every morning. 90 tablet 0  . losartan (COZAAR) 50 MG tablet Take 1 tablet (50 mg total) by mouth in the morning then take 0.5 tablet (25 mg total) by mouth at night. 135 tablet 3  . mirtazapine (REMERON SOL-TAB) 15 MG disintegrating tablet Take 1 tablet (15 mg total) by mouth at bedtime. (Patient taking differently: Take 7.5 mg by mouth at bedtime. ) 30 tablet 3   No facility-administered medications prior to visit.  Allergies:   Patient has no known allergies.   Social History   Social History  . Marital status: Widowed    Spouse name: N/A  . Number of children: N/A  . Years of education: N/A   Social History Main Topics  . Smoking status: Never Smoker  . Smokeless tobacco: Never Used  . Alcohol use No  . Drug use: No  . Sexual activity: Not on file   Other Topics Concern  . Not on file   Social History Narrative  . No narrative on file     Family History:  The patient's family history includes Breast cancer in her unknown relative; COPD in her mother; Heart failure in her mother; Hypertension in her brother, brother, sister, and sister; Stomach cancer in her unknown relative; Stroke in her father and sister. She is one of 3 siblings from a group of 9 siblings that have aneurysms of the thoracic aorta. Her children have all had scans that did not show evidence of aneurysm.  ROS:   Please see the history of present illness.    ROS All other systems reviewed and are negative.    PHYSICAL EXAM:   VS:  BP (!) 104/50   Pulse 75   Ht 5\' 6"  (1.676 m)   Wt 118  lb 3.2 oz (53.6 kg)   BMI 19.08 kg/m      General: Alert, oriented x3, no distress. Very slender and frail-appearing Head: no evidence of trauma, PERRL, EOMI, no exophtalmos or lid lag, no myxedema, no xanthelasma; normal ears, nose and oropharynx Neck: normal jugular venous pulsations and no hepatojugular reflux; brisk carotid pulses without delay and no carotid bruits Chest: clear to auscultation, no signs of consolidation by percussion or palpation, normal fremitus, symmetrical and full respiratory excursions Cardiovascular: normal position and quality of the apical impulse, regular rhythm, normal first and second heart sounds, 1-2/6 early peaking aortic ejection murmur, 2/6 decrescendo diastolic murmur in the aortic focus, no apical murmurs, rubs or gallops. Midsystolic click is present. Abdomen: no tenderness or distention, no masses by palpation, no abnormal pulsatility or arterial bruits, normal bowel sounds, no hepatosplenomegaly Extremities: no clubbing, cyanosis or edema; 2+ radial, ulnar and brachial pulses bilaterally; 2+ right femoral, posterior tibial and dorsalis pedis pulses; 2+ left femoral, posterior tibial and dorsalis pedis pulses; no subclavian or femoral bruits Neurological: grossly nonfocal    Wt Readings from Last 3 Encounters:  03/31/17 118 lb 3.2 oz (53.6 kg)  02/13/17 122 lb 8 oz (55.6 kg)  01/18/17 119 lb (54 kg)      Studies/Labs Reviewed:   ECHO: 02/02/2017  - Left ventricle: The cavity size was normal. There was moderate   concentric hypertrophy. Systolic function was normal. The   estimated ejection fraction was in the range of 60% to 65%. Wall   motion was normal; there were no regional wall motion   abnormalities. Features are consistent with a pseudonormal left   ventricular filling pattern, with concomitant abnormal relaxation   and increased filling pressure (grade 2 diastolic dysfunction). - Aortic valve: Poorly visualized. Moderate diffuse  thickening and   calcification. There was moderate regurgitation. Regurgitation   pressure half-time: 326 ms. - Mitral valve: Calcified annulus. Moderate diffuse thickening and   calcification of the anterior leaflet, with mild involvement of   chords.  EKG:  EKG is ordered today.  The ekg ordered today demonstrates Sinus rhythm with first-degree AV block, PR 246 ms, QS pattern in V1-V2, pre-existing ST segment  depression and T-wave inversion in the inferior leads as well as V4-V6, QTC 460 ms. Narrow QRS 88 ms  Recent Labs: 02/13/2017: ALT 9; BUN 25; Creatinine, Ser 0.91; Hemoglobin 13.5; Platelets 155.0; Potassium 4.7; Sodium 140; TSH 1.33    ASSESSMENT:    1. Chronic diastolic heart failure ([place])   2. SVT (supraventricular tachycardia) ([place])   3. Encounter for monitoring flecainide therapy   4. Second degree AV block   5. Thoracic aortic aneurysm without rupture ([place])   6. Nonrheumatic aortic valve insufficiency   7. Essential hypertension   8. MVP (mitral valve prolapse)      PLAN:  In order of problems listed above:  1. CHF: Currently without overt evidence of hypervolemia. Did not tolerate even 1 dose of furosemide. Suspect her multiple vertebral fractures have something to do with her exertional dyspnea. 2. SVT/PACs: Flecainide has offered excellent symptom relief and she wishes to continue this.  3. Flecainide: We have discussed potential toxicity of this drug in her age group but so far no symptoms or ECG signs of flecainide toxicity. First-degree AV block is the only concerning finding on her EKG. QRS is narrow and QT is normal 4. 2nd deg AVB: This was seen while he was taking beta blockers but has not returned since these were discontinued 5. Ao aneurysm: Multiple family members have this disorder and is likely genetic. Unable to take beta blockers due to symptomatic bradycardia. 6. AR: Status post resuspension of the native valve at the time of aortic aneurysm repair  Moderate and unchanged on serial echoes 7. HTN: Excellent control. Continue amlodipine and losartan, but wil stop pt evening dose of losartan to see if we can avoid morning weakness. 8. MVP:  click is present on exam but no apical holosystolic murmur. No significant regurgitation by recent echo     Medication Adjustments/Labs and Tests Ordered: Current medicines are reviewed at length with the patient today.  Concerns regarding medicines are outlined above.  Medication changes, Labs and Tests ordered today are listed in the Patient Instructions below. Patient Instructions  Dr Sallyanne Kuster has recommended making the following medication changes: 1. STOP EVENING DOSE OF LOSARTAN - Continue 1 tablet by mouth daily  Your physician recommends that you schedule a follow-up appointment in 6 months. You will receive a reminder letter in the mail two months in advance. If you don't receive a letter, please call our office to schedule the follow-up appointment.  If you need a refill on your cardiac medications before your next appointment, please call your pharmacy.    Signed, Sanda Klein, MD  03/31/2017 1:50 PM    [facility] [address], [place], [place]  [address] Phone: [phone]; Fax: [phone]

## 2017-04-13 ENCOUNTER — Telehealth (HOSPITAL_COMMUNITY): Payer: Self-pay | Admitting: Cardiovascular Disease

## 2017-04-13 NOTE — Telephone Encounter (Signed)
Ok [employer]

## 2017-04-13 NOTE — Telephone Encounter (Signed)
New message   Pt is asking about her blood pressure, and would not give more details states it is only for nurse.

## 2017-04-13 NOTE — Telephone Encounter (Signed)
Returned call to patient of Dr. Loletha Grayer concerning her BP She states MD changed her losartan from 50mg  QAM and 25mg  QHS to just 50mg  QD She states her BP has been running in the 130s/60s for a day or two Her BP cuff messed up last week so she didn't check BP over the weekend Patient used her daughter's BP cuff on Monday 8/13 and her BP is now reading in the 160s-170s She states she checked her old BP cuff with her daughter's newer cuff and they were essentially the same She would like to know if she should resume her previous dose of this medication  Will defer to MD & pharmacist for advice

## 2017-04-13 NOTE — Addendum Note (Signed)
Addended by: Fidel Levy on: 04/13/2017 04:14 PM   Modules accepted: Orders

## 2017-04-13 NOTE — Telephone Encounter (Signed)
Losartan dose was decreased during last office visit due to contains of morning weakness.   Recommendation:  If morning weakness symptoms resolved, please resume losartan 50mg  in AM and 25 mg with supper.

## 2017-04-13 NOTE — Telephone Encounter (Signed)
Patient aware of med change recommendations. Med list updated.

## 2017-04-25 ENCOUNTER — Telehealth: Payer: Self-pay | Admitting: Cardiovascular Disease

## 2017-04-25 NOTE — Telephone Encounter (Signed)
New message    Pt c/o BP issue: STAT if pt c/o blurred vision, one-sided weakness or slurred speech  1. What are your last 5 BP readings? 147/60 170/70   2. Are you having any other symptoms (ex. Dizziness, headache, blurred vision, passed out)? No   3. What is your BP issue? Pt needs to talk to nurse about her bp.

## 2017-04-25 NOTE — Telephone Encounter (Signed)
I would not think that those blood pressure readings could be responsible for her symptoms. Has she noticed problems with slow heart rates or rhythm irregularities? Last time when we stopped the evening dose of losartan she seemed to feel a little better. Let's try that again. Take only losartan 50 mg in the morning and none in the evening. MCr

## 2017-04-25 NOTE — Telephone Encounter (Signed)
S/w pt she states that her BP has been ranging today 147/60, yesterday am 157/64 pm 134/66 and states she is taking losartan 50mg  am and 25mg  pm(taking amlodipine as directed). She is feeling SOB and fatigue, (she states that she has to lie down a couple times while taking a shower and doing her hair and other ADL's) She states that she is going out of town the 2nd week of September and would like to gt this taken care of before the.she states that she will continue BP log until then, please advise do you want to change any medications?

## 2017-04-26 NOTE — Telephone Encounter (Signed)
Pt notified she does not appreciate her HR and does not notice any rhythm irregularities. She will stop evening dose of losartan and call back with any changes, she states that she did feel better when previously stopped losartan but BP went up to the 170's. Will call back with any changes.

## 2017-05-15 ENCOUNTER — Other Ambulatory Visit: Payer: Self-pay | Admitting: General Practice

## 2017-05-15 MED ORDER — LEVOTHYROXINE SODIUM 50 MCG PO TABS: 50.0000 ug | ORAL_TABLET | Freq: Every morning | ORAL | 0 refills | Status: DC

## 2017-05-28 IMAGING — CT CT FEMUR *L* W/O CM
3 of 10 series · 8 of 33 positions shown, 9 images · non-contrast
Comparison: Left femur radiograph dated 08/15/2016

CLINICAL DATA: 89-year-old female with fall and left hip pain.

EXAM:
CT PELVIS AND LEFT LOWER EXTREMITY WITHOUT CONTRAST
TECHNIQUE: Multidetector CT imaging of the pelvis and left lower extremity was
performed according to the standard protocol without intravenous
contrast. Multiplanar CT image reconstructions were also generated.

[Series 205: coronal soft · coronal · 0.34mm/px · 3 of 153 slices shown]
[im 39/153  bone]
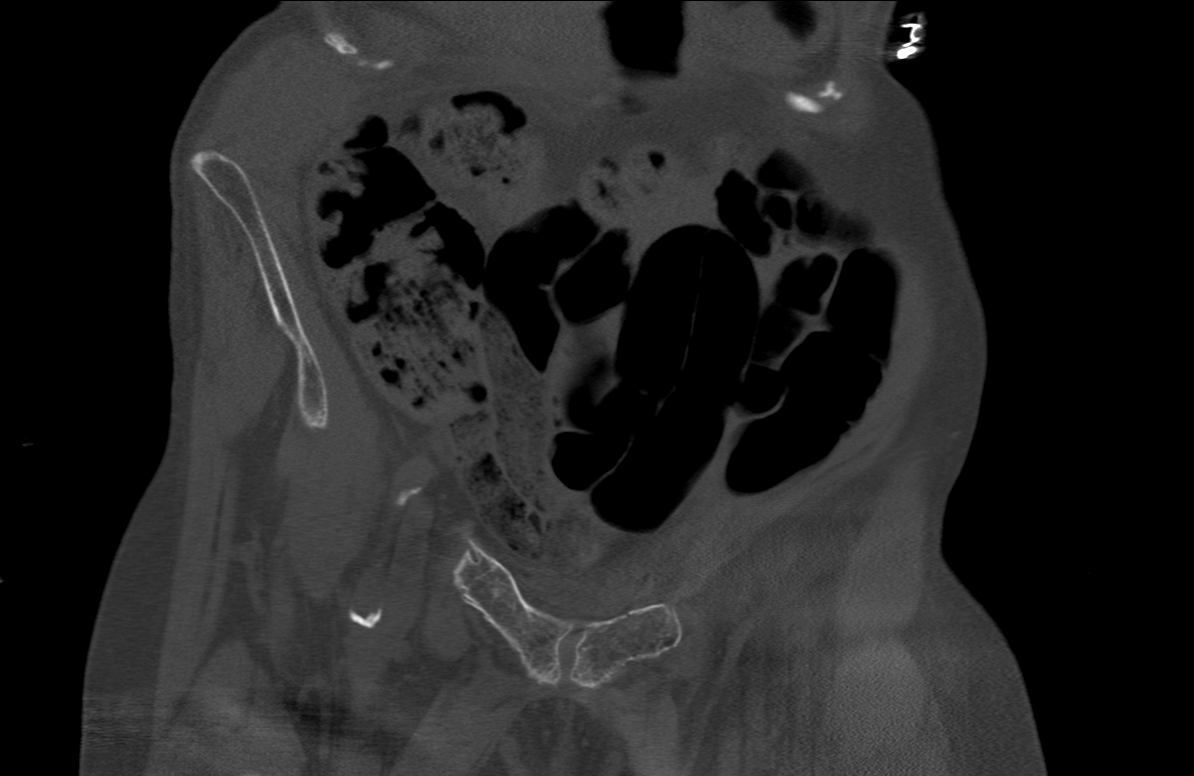
[im 77/153  bone]
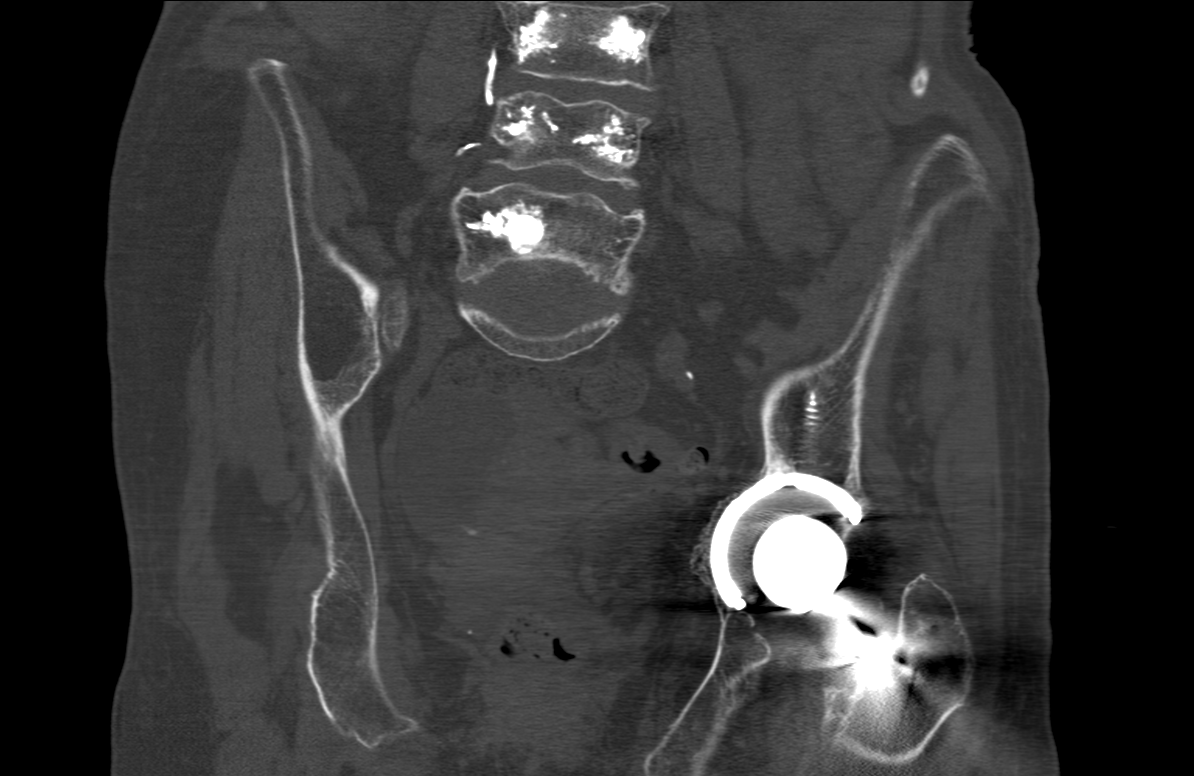
[im 115/153  bone]
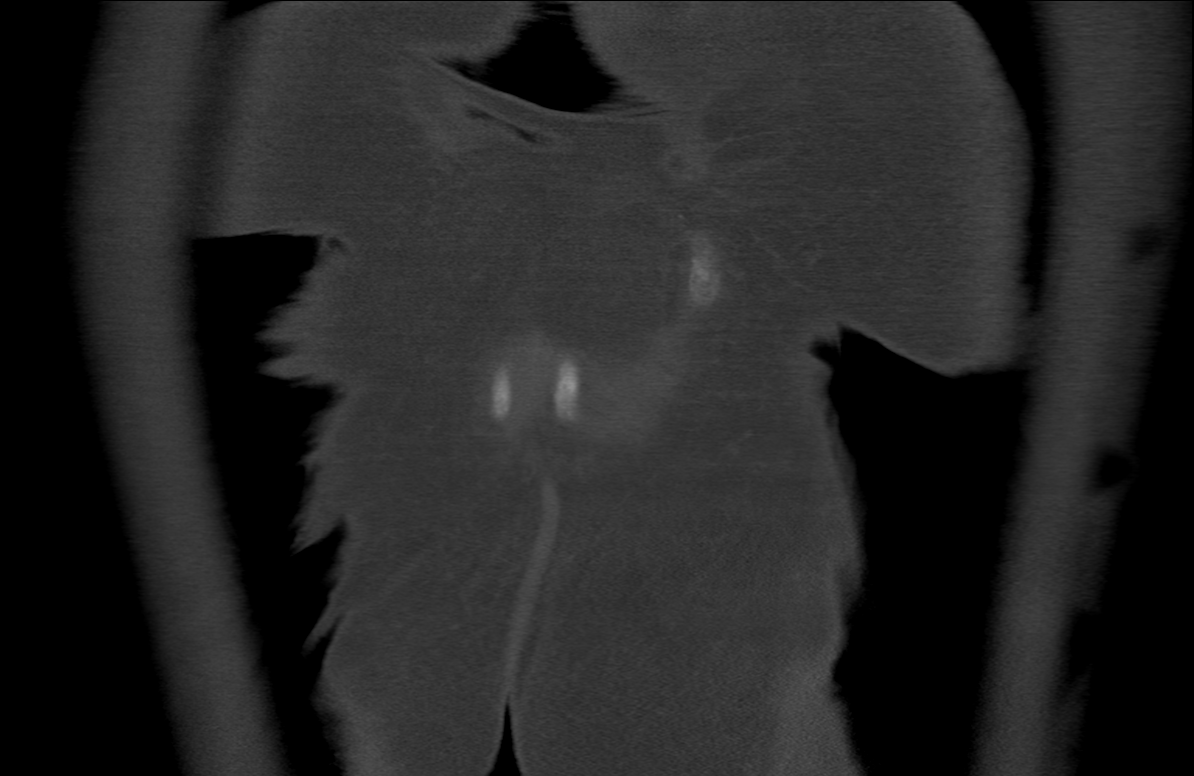

[Series 301: bone · axial · 0.50mm/px · z∈[+214,+676]mm · 3 of 244 slices shown, 4 images]
[im 1/244  soft-tissue]
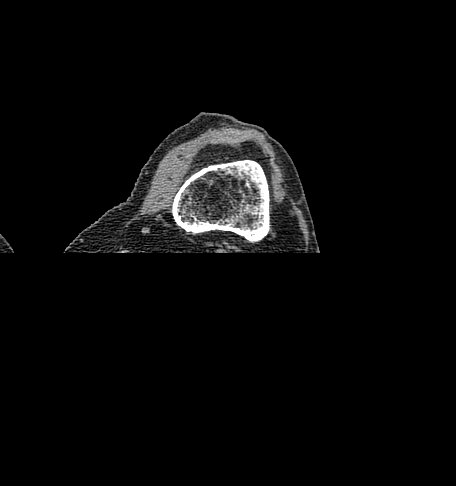
[im 1/244  bone]
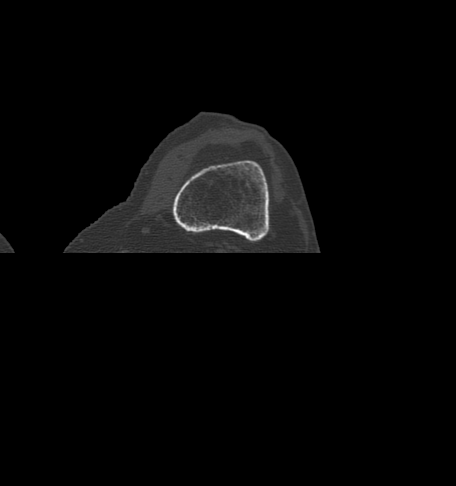
[im 122/244  bone]
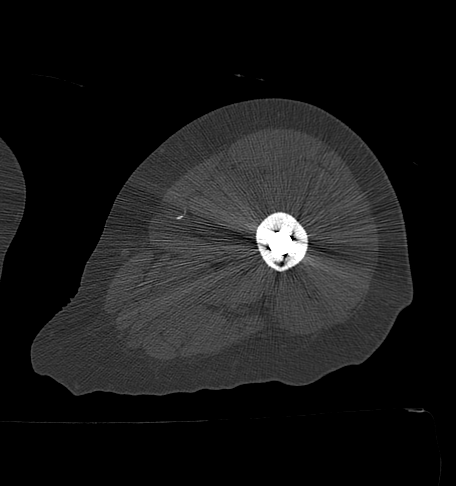
[im 244/244  bone]
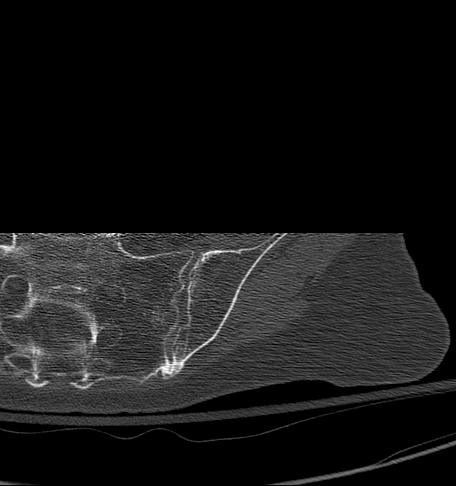

[Series 501: — · axial · 0.32mm/px · z∈[+121,+157]mm · 2 of 326 slices shown]
[im 109/326  bone]
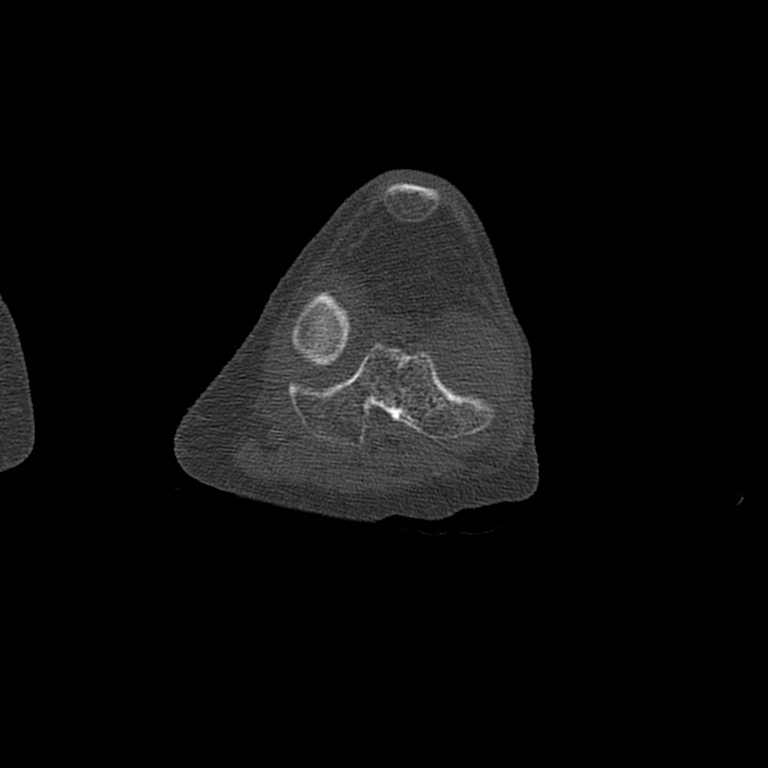
[im 217/326  bone]
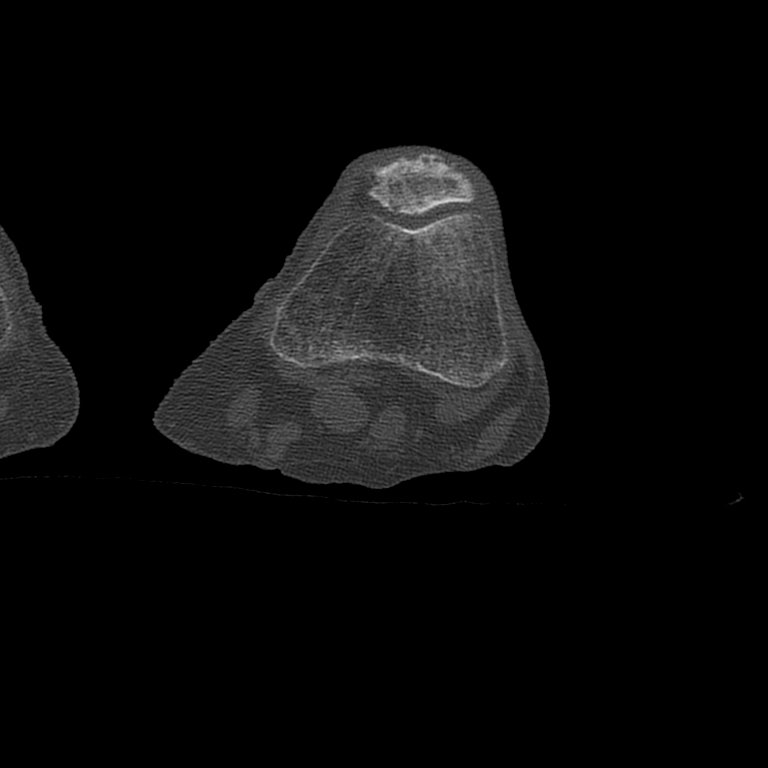

[8 of 33 positions shown; findings below may reference images not displayed]

FINDINGS: Evaluation of the pelvic structures is limited in the absence of
contrast as well as due to streak artifact caused by lumbar
vertebroplasty.

CT PELVIS FINDINGS

Urinary Tract: There is mild fullness of the visualized inferior
portion of the left kidney. The right kidney is not visualized. The
urinary bladder is mildly distended and grossly unremarkable.

Bowel: Moderate stool noted within the colon. There is sigmoid
diverticulosis without active inflammatory changes. No bowel
dilatation noted within the pelvis. The appendix is not visualized
with certainty. No inflammatory changes identified in the right
lower quadrant.

Vascular/Lymphatic: There is advanced aortoiliac atherosclerotic
disease. Evaluation of the vessels is limited in the absence of
intravenous contrast.

Reproductive:  The uterus is grossly unremarkable.

Other:  None

Musculoskeletal: There is advanced osteopenia which limits
evaluation for fracture. Multilevel old-appearing lower lumbar
compression deformities and vertebroplasty changes. There is
extrusion of small amount of the vertebroplasty cement along the
posterior cortex of the L5. There is cortical discontinuity of the
inferior aspect of the left sacral alum (coronal series 203, image
86) concerning for an age indeterminate fracture. There are old
healed right pubic bone fractures. There is a minimally displaced
comminuted acute fracture of the left superior pubic ramus as well
as a nondisplaced fracture of the left inferior pubic ramus. The
symphysis pubis appears intact.

CT LEFT LOWER EXTREMITY FINDINGS

Bones/Joint/Cartilage

There is a total left hip arthroplasty. The arthroplasty components
appear intact and diffuse anatomic alignment. There is a comminuted
appearing nondisplaced sub trochanteric fracture with extension of
the fracture in greater trochanter of the femur. The fracture
involves the proximal femoral diaphysis. Evaluation of the fracture
is very limited due to advanced osteopenia as well as streak
artifact caused by orthopedic hardware. The femoral component of the
arthroplasty appears to be maintained a satisfactory contact with
the adjacent bone. No definite lucency identified adjacent to the
hardware.

There is no fracture or dislocation of the left knee.

Ligaments

Suboptimally assessed by CT.

Muscles and Tendons

Probable mild soft tissue edema in the anterior compartment of the
left femur involving the quadriceps musculature. No fluid collection
or large hematoma.

Soft tissues

No large fluid collection or hematoma.
IMPRESSION: Fractures of the left pubic rami as well as an age indeterminate
fracture of the inferior aspect of the left sacral alum.

Nondisplaced comminuted left femoral subtrochanteric fracture with
extension into the greater trochanter. The orthopedic hardware
appears intact and in anatomic alignment.

No dislocation.

## 2017-07-11 ENCOUNTER — Other Ambulatory Visit: Payer: Self-pay

## 2017-07-11 ENCOUNTER — Emergency Department (HOSPITAL_BASED_OUTPATIENT_CLINIC_OR_DEPARTMENT_OTHER): Payer: Medicare Other

## 2017-07-11 ENCOUNTER — Encounter: Payer: Self-pay | Admitting: Physician Assistant

## 2017-07-11 ENCOUNTER — Ambulatory Visit: Payer: Medicare Other | Admitting: Physician Assistant

## 2017-07-11 ENCOUNTER — Emergency Department (HOSPITAL_BASED_OUTPATIENT_CLINIC_OR_DEPARTMENT_OTHER)
Admission: EM | Admit: 2017-07-11 | Discharge: 2017-07-11 | Disposition: A | Discharge status: Home / Self Care | Payer: Medicare Other | Attending: Physician Assistant | Admitting: Physician Assistant

## 2017-07-11 ENCOUNTER — Encounter (HOSPITAL_BASED_OUTPATIENT_CLINIC_OR_DEPARTMENT_OTHER): Payer: Self-pay | Admitting: Emergency Medicine

## 2017-07-11 VITALS — BP 98/40 | HR 87 | Temp 97.7°F | Resp 16 | Ht 66.0 in | Wt 121.0 lb

## 2017-07-11 DIAGNOSIS — Z7982 Long term (current) use of aspirin: Secondary | ICD-10-CM | POA: Diagnosis not present

## 2017-07-11 DIAGNOSIS — J069 Acute upper respiratory infection, unspecified: Secondary | ICD-10-CM | POA: Diagnosis not present

## 2017-07-11 DIAGNOSIS — E860 Dehydration: Secondary | ICD-10-CM

## 2017-07-11 DIAGNOSIS — I110 Hypertensive heart disease with heart failure: Secondary | ICD-10-CM | POA: Insufficient documentation

## 2017-07-11 DIAGNOSIS — R0682 Tachypnea, not elsewhere classified: Secondary | ICD-10-CM

## 2017-07-11 DIAGNOSIS — I5032 Chronic diastolic (congestive) heart failure: Secondary | ICD-10-CM | POA: Insufficient documentation

## 2017-07-11 DIAGNOSIS — E039 Hypothyroidism, unspecified: Secondary | ICD-10-CM | POA: Insufficient documentation

## 2017-07-11 DIAGNOSIS — J988 Other specified respiratory disorders: Secondary | ICD-10-CM | POA: Diagnosis not present

## 2017-07-11 DIAGNOSIS — B9789 Other viral agents as the cause of diseases classified elsewhere: Secondary | ICD-10-CM | POA: Diagnosis not present

## 2017-07-11 DIAGNOSIS — Z85828 Personal history of other malignant neoplasm of skin: Secondary | ICD-10-CM | POA: Insufficient documentation

## 2017-07-11 DIAGNOSIS — R0602 Shortness of breath: Secondary | ICD-10-CM | POA: Diagnosis present

## 2017-07-11 LAB — COMPREHENSIVE METABOLIC PANEL
ALT: 30 U/L (ref 14–54)
ANION GAP: 7 (ref 5–15)
AST: 63 U/L — ABNORMAL HIGH (ref 15–41)
Albumin: 3.6 g/dL (ref 3.5–5.0)
Alkaline Phosphatase: 139 U/L — ABNORMAL HIGH (ref 38–126)
BUN: 14 mg/dL (ref 6–20)
CHLORIDE: 104 mmol/L (ref 101–111)
CO2: 26 mmol/L (ref 22–32)
CREATININE: 1.05 mg/dL — AB (ref 0.44–1.00)
Calcium: 9 mg/dL (ref 8.9–10.3)
GFR calc non Af Amer: 46 mL/min — ABNORMAL LOW (ref >60)
GFR, EST AFRICAN AMERICAN: 53 mL/min — AB (ref >60)
Glucose, Bld: 100 mg/dL — ABNORMAL HIGH (ref 65–99)
POTASSIUM: 4.9 mmol/L (ref 3.5–5.1)
SODIUM: 137 mmol/L (ref 135–145)
Total Bilirubin: 1.1 mg/dL (ref 0.3–1.2)
Total Protein: 7.2 g/dL (ref 6.5–8.1)

## 2017-07-11 LAB — TROPONIN I

## 2017-07-11 LAB — CBC WITH DIFFERENTIAL/PLATELET
Basophils Absolute: 0 10*3/uL (ref 0.0–0.1)
Basophils Relative: 0 %
EOS ABS: 0.1 10*3/uL (ref 0.0–0.7)
EOS PCT: 1 %
HCT: 39.5 % (ref 36.0–46.0)
Hemoglobin: 12.7 g/dL (ref 12.0–15.0)
LYMPHS PCT: 17 %
Lymphs Abs: 0.9 10*3/uL (ref 0.7–4.0)
MCH: 30.3 pg (ref 26.0–34.0)
MCHC: 32.2 g/dL (ref 30.0–36.0)
MCV: 94.3 fL (ref 78.0–100.0)
Monocytes Absolute: 0.7 10*3/uL (ref 0.1–1.0)
Monocytes Relative: 13 %
Neutro Abs: 3.5 10*3/uL (ref 1.7–7.7)
Neutrophils Relative %: 69 %
PLATELETS: 133 10*3/uL — AB (ref 150–400)
RBC: 4.19 MIL/uL (ref 3.87–5.11)
RDW: 14.5 % (ref 11.5–15.5)
WBC: 5.1 10*3/uL (ref 4.0–10.5)

## 2017-07-11 LAB — INFLUENZA PANEL BY PCR (TYPE A & B)
INFLAPCR: NEGATIVE
INFLBPCR: NEGATIVE

## 2017-07-11 LAB — BRAIN NATRIURETIC PEPTIDE: B NATRIURETIC PEPTIDE 5: 341.5 pg/mL — AB (ref 0.0–100.0)

## 2017-07-11 MED ORDER — ACETAMINOPHEN 325 MG PO TABS
650.0000 mg | ORAL_TABLET | Freq: Once | ORAL | Status: AC
  Administered 2017-07-11: 650 mg via ORAL
  Filled 2017-07-11: qty 2

## 2017-07-11 MED ORDER — SODIUM CHLORIDE 0.9 % IV BOLUS (SEPSIS)
500.0000 mL | Freq: Once | INTRAVENOUS | Status: AC
  Administered 2017-07-11: 500 mL via INTRAVENOUS

## 2017-07-11 NOTE — Progress Notes (Signed)
Patient presents to clinic today w/ son-in-law c/o 1 week of sinus pressure, sinus pain with productive cough. Noted nausea and lightheadedness. Is not hydrating well at home. Has noted SOBOE that has worsened to SOB at rest. Denies leg swelling, fever. Notes chills.   Past Medical History:  Diagnosis Date  . Acute blood loss anemia 2011   WITH ANEURYSM ONLY  . Afib ([place])    -no   . Arthritis   . Cancer ([place]) LATE 1970'S   skin cancer - arm squamous  . Compression fracture of body of thoracic vertebra ([place])    T 5  . Compression fracture of L2 lumbar vertebra ([facility])   . GERD (gastroesophageal reflux disease)    "mild"  . Heart murmur   . History of atrial paroxysmal tachycardia   . History of blood transfusion   . History of hypothyroidism   . Hypertension   . Hypothyroidism   . Mitral valve prolapse   . Moderate aortic insufficiency 05/25/2014  . Osteoporosis   . Pericardial tamponade 05/2010  . Pneumonia    many years ago  . Premature atrial contractions   . Syncope    VERY RARE  . Thoracic aortic aneurysm ([place]) 05/2010   Ruptured ascending  . UTI (lower urinary tract infection)    CHRONIC  . Volume overload 2011  . Wrist fracture    left    Current Outpatient Medications on File Prior to Visit  Medication Sig Dispense Refill  . amLODipine (NORVASC) 5 MG tablet Take 1 tablet (5 mg total) by mouth daily. 90 tablet 3  . aspirin EC 325 MG tablet Take 325 mg by mouth every evening.     . calcium-vitamin D (OSCAL WITH D) 500-200 MG-UNIT per tablet Take 1 tablet by mouth daily at 12 noon.    . cholecalciferol (VITAMIN D) 1000 UNITS tablet Take 2,000 Units by mouth daily at 12 noon.     . flecainide (TAMBOCOR) 50 MG tablet Take 0.5 tablets (25 mg total) by mouth 2 (two) times daily. 90 tablet 3  . levothyroxine (SYNTHROID, LEVOTHROID) 50 MCG tablet Take 1 tablet (50 mcg total) by mouth every morning. 90 tablet 0  . losartan (COZAAR) 50 MG tablet Take 1 tablet (50mg ) by  mouth in the morning and 1/2 tablet (25mg ) in the evening.    . mirtazapine (REMERON) 15 MG tablet Take 7.5 mg by mouth. Three times a week    . amoxicillin (AMOXIL) 500 MG tablet Take 4 tablets (2gram) by mouth one hour prior to dental work. (Patient not taking: Reported on 07/11/2017) 20 tablet 0   No current facility-administered medications on file prior to visit.     No Known Allergies  Family History  Problem Relation Age of Onset  . Heart failure Mother   . COPD Mother        never smoker  . Stroke Father   . Hypertension Brother   . Hypertension Brother   . Hypertension Sister   . Hypertension Sister   . Stroke Sister   . Stomach cancer Unknown        nephew  . Breast cancer Unknown        neice    Social History   Socioeconomic History  . Marital status: Widowed    Spouse name: None  . Number of children: None  . Years of education: None  . Highest education level: None  Social Needs  . Financial resource strain: None  . Food  insecurity - worry: None  . Food insecurity - inability: None  . Transportation needs - medical: None  . Transportation needs - non-medical: None  Occupational History  . None  Tobacco Use  . Smoking status: Never Smoker  . Smokeless tobacco: Never Used  Substance and Sexual Activity  . Alcohol use: No  . Drug use: No  . Sexual activity: None  Other Topics Concern  . None  Social History Narrative  . None   Review of Systems - See HPI.  All other ROS are negative.  BP (!) 98/40   Pulse 87   Temp 97.7 F (36.5 C) (Oral)   Resp 16   Ht 5\' 6"  (1.676 m)   Wt 121 lb (54.9 kg)   SpO2 97%   BMI 19.53 kg/m   Physical Exam  Constitutional: She is oriented to person, place, and time and well-developed, well-nourished, and in no distress.  HENT:  Head: Normocephalic and atraumatic.  Eyes: Conjunctivae are normal.  Neck: Neck supple.  Cardiovascular: Normal rate, regular rhythm, normal heart sounds and intact distal pulses.    Pulmonary/Chest: Effort normal and breath sounds normal. No respiratory distress. She has no wheezes. She has no rales. She exhibits no tenderness.  Tachypneic at rest. Worsened with exertion. Accessory muscle use on exertion only.   Neurological: She is alert and oriented to person, place, and time.  Skin: Skin is warm and dry. No rash noted.  Poor skin turgor.  Psychiatric: Affect normal.  Vitals reviewed.  Assessment/Plan: 1. Upper respiratory tract infection, unspecified type 2. Tachypnea 3. Dehydration Unable to get adequate hydration at home. Family is trying to force more fluids with no success. Lungs seem CTAB but she has shallow respirations. She is tachypneic at rest. Needs IV fluids and more acute assessment. Concern for impending respiratory collapse if there is no intervention. Spoke with son-in-law (present at visit) and patient's daughter phone who are in agreement with IV fluids but do not want her to go to ER due to risk of germs. After long discussion, decided on [facility] ER for IV fluids and assessment. ER MD made aware.    Leeanne Rio, PA-C

## 2017-07-11 NOTE — ED Notes (Signed)
ED Provider at bedside. 

## 2017-07-11 NOTE — ED Notes (Signed)
Patient transported to X-ray 

## 2017-07-11 NOTE — ED Triage Notes (Signed)
Pt sent from PCP for eval of cough and SOB, decreased PO intake for 4 days.

## 2017-07-11 NOTE — Patient Instructions (Signed)
I am having you go to the ER with your son-in-law for further assessment giving how rapidly you are breathing. Even though your BP, heart rate and oxygen are stable, I am very concerned you are going to fatigue out and things will rapidly worsen. You need a more expedited assessment and management.  I will call ahead to the ER to make them aware you are coming.

## 2017-07-11 NOTE — Discharge Instructions (Signed)
We are unsure what is causing all of your.  However your labs have been reassuring here and you were able to ambulate around the department with ample energy.  Please return with any increasing concerns, with nausea with vomiting chest pain, or other concerns.

## 2017-07-11 NOTE — ED Notes (Signed)
Ambulated in hall on room air, HR 95-105, RR 28-30, SpO2 (93-98%). Denies increased WOB but remains tachypnic.

## 2017-07-11 NOTE — Progress Notes (Signed)
Pre visit review using our clinic review tool, if applicable. No additional management support is needed unless otherwise documented below in the visit note. 

## 2017-07-11 NOTE — ED Provider Notes (Signed)
[facility] EMERGENCY DEPARTMENT Provider Note   CSN: [id number] Arrival date & time: 07/11/17  1059     History   Chief Complaint Chief Complaint  Patient presents with  . Shortness of Breath    HPI Yvonne [name] is a 83 y.o. female.  HPI   83 year old female presenting here today with feelings of fatigue.  Patient's been feeling poorly for the last 3-4 days.  Went to urgent care today run by her primary care's office, found to be tachypneic and appeared weak so was sent here for further evaluation.  Patient has had no real focal symptoms except mild cough and congestion.  No fevers.  No nausea vomiting diarrhea.  No abdominal pain no dysuria.  Reports decrease in appetite.  And not drinking fluids at home.  Since past medical history is significant for diastolic heart failure, aortic insufficiency, paroxysmal A. fib, thoracic  aortic aneurysm repair  Here patient saying that she would like to go home since her son-in-law has to work.   Past Medical History:  Diagnosis Date  . Acute blood loss anemia 2011   WITH ANEURYSM ONLY  . Afib ([place])    -no   . Arthritis   . Cancer ([place]) LATE 1970'S   skin cancer - arm squamous  . Compression fracture of body of thoracic vertebra ([place])    T 5  . Compression fracture of L2 lumbar vertebra ([facility])   . GERD (gastroesophageal reflux disease)    "mild"  . Heart murmur   . History of atrial paroxysmal tachycardia   . History of blood transfusion   . History of hypothyroidism   . Hypertension   . Hypothyroidism   . Mitral valve prolapse   . Moderate aortic insufficiency 05/25/2014  . Osteoporosis   . Pericardial tamponade 05/2010  . Pneumonia    many years ago  . Premature atrial contractions   . Syncope    VERY RARE  . Thoracic aortic aneurysm ([place]) 05/2010   Ruptured ascending  . UTI (lower urinary tract infection)    CHRONIC  . Volume overload 2011  . Wrist fracture    left    Patient Active Problem  List   Diagnosis Date Noted  . Chronic diastolic heart failure ([place]) 03/31/2017  . Fracture of multiple ribs of left side 08/18/2016  . Left wrist fracture 08/18/2016  . Closed left subtrochanteric femur fracture ([place]) 08/18/2016  . Closed fracture of left superior pubic ramus ([place]) 08/18/2016  . NSVT (nonsustained ventricular tachycardia) ([place])   . Fall 08/16/2016  . Physical exam 08/08/2016  . MVP (mitral valve prolapse) 06/24/2016  . Upper airway cough syndrome 03/07/2016  . Shortness of breath 02/23/2016  . Other fatigue 02/23/2016  . Compression fracture of thoracic vertebra ([place]) 01/22/2016  . Abdominal pain, epigastric 12/28/2015  . Osteoporosis 12/28/2015  . Surgery, elective 12/09/2015  . Second degree AV block 06/26/2015  . Aortic insufficiency 05/25/2014  . Orthostatic hypotension 12/19/2013  . Syncope 12/19/2013  . AV block, 1st degree 12/19/2013  . Malnourished ([place]) 12/19/2013  . Hx: UTI (urinary tract infection) 12/19/2013  . Systolic murmur 42/35/3614  . Pelvic fracture ([place]) 10/25/2012  . Thoracic aortic aneurysm ([place])   . History of PSVT (paroxysmal supraventricular tachycardia)   . Compression fracture of L2 lumbar vertebra ([facility])   . Afib ([place])   . Volume overload   . SVT (supraventricular tachycardia) ([place]) 09/05/2010  . Hypothyroidism 09/02/2010  . Essential hypertension 09/02/2010  Past Surgical History:  Procedure Laterality Date  . ABLATION    . APPENDECTOMY    . BREAST SURGERY Right    right biospy  . Classic atrioventricular nodal reentrant  10/20/2010  . COLONOSCOPY    . EYE SURGERY Bilateral 2003   cataract bilateral  . HERNIA REPAIR  1993   Right Inguinal  . L2 kyphoplasty using Kyphon system.  03/17/2009  . PERCUTANEOUS PINNING WRIST FRACTURE Bilateral 1995   bil  . Replacement of ascending aorta and hemiarch and resuspension of the aortic valve with circulatory cardiopulmonary bypass and circulatory arrest  06/25/2010   Dr Servando Snare   . Revision of left total hip replacement using some allograft  11/23/2010  . TOE OSTEOTOMY     Right 2nd   . TONSILLECTOMY    . TUBAL LIGATION  1966    OB History    No data available       Home Medications    Prior to Admission medications   Medication Sig Start Date End Date Taking? Authorizing Provider  amLODipine (NORVASC) 5 MG tablet Take 1 tablet (5 mg total) by mouth daily. 01/02/17   Croitoru, Mihai, MD  aspirin EC 325 MG tablet Take 325 mg by mouth every evening.     [provider]  calcium-vitamin D (OSCAL WITH D) 500-200 MG-UNIT per tablet Take 1 tablet by mouth daily at 12 noon.    [provider]  cholecalciferol (VITAMIN D) 1000 UNITS tablet Take 2,000 Units by mouth daily at 12 noon.     [provider]  flecainide (TAMBOCOR) 50 MG tablet Take 0.5 tablets (25 mg total) by mouth 2 (two) times daily. 01/02/17   Croitoru, Mihai, MD  levothyroxine (SYNTHROID, LEVOTHROID) 50 MCG tablet Take 1 tablet (50 mcg total) by mouth every morning. 05/15/17   Midge Minium, MD  losartan (COZAAR) 50 MG tablet Take 1 tablet (50mg ) by mouth in the morning and 1/2 tablet (25mg ) in the evening.    [provider]  mirtazapine (REMERON) 15 MG tablet Take 7.5 mg by mouth. Three times a week    [provider]    Family History Family History  Problem Relation Age of Onset  . Heart failure Mother   . COPD Mother        never smoker  . Stroke Father   . Hypertension Brother   . Hypertension Brother   . Hypertension Sister   . Hypertension Sister   . Stroke Sister   . Stomach cancer Unknown        nephew  . Breast cancer Unknown        neice    Social History Social History   Tobacco Use  . Smoking status: Never Smoker  . Smokeless tobacco: Never Used  Substance Use Topics  . Alcohol use: No  . Drug use: No     Allergies   Patient has no known allergies.   Review of Systems Review of Systems   Physical  Exam Updated Vital Signs BP (!) 164/65 Comment: after ambulating  Pulse 94 Comment: after ambulating  Temp 98.5 F (36.9 C) (Oral)   Resp (!) 28 Comment: after ambulating  Ht 5\' 6"  (1.676 m)   Wt 54.9 kg (121 lb)   SpO2 96% Comment: after ambulating  BMI 19.53 kg/m   Physical Exam  Constitutional: She is oriented to person, place, and time. She appears well-developed and well-nourished.  HENT:  Head: Normocephalic and atraumatic.  Signs of congfestion  Eyes: Right eye exhibits no discharge.  Cardiovascular: Normal rate, regular rhythm and normal heart sounds.  No murmur heard. Pulmonary/Chest: Effort normal and breath sounds normal. She has no wheezes. She has no rales.  Scar.  Abdominal: Soft. She exhibits no distension. There is no tenderness.  Neurological: She is oriented to person, place, and time.  Skin: Skin is warm and dry. She is not diaphoretic.  Psychiatric: She has a normal mood and affect.  Nursing note and vitals reviewed.    ED Treatments / Results  Labs (all labs ordered are listed, but only abnormal results are displayed) Labs Reviewed  COMPREHENSIVE METABOLIC PANEL - Abnormal; Notable for the following components:      Result Value   Glucose, Bld 100 (*)    Creatinine, Ser 1.05 (*)    AST 63 (*)    Alkaline Phosphatase 139 (*)    GFR calc non Af Amer 46 (*)    GFR calc Af Amer 53 (*)    All other components within normal limits  CBC WITH DIFFERENTIAL/PLATELET - Abnormal; Notable for the following components:   Platelets 133 (*)    All other components within normal limits  BRAIN NATRIURETIC PEPTIDE - Abnormal; Notable for the following components:   B Natriuretic Peptide 341.5 (*)    All other components within normal limits  TROPONIN I  CBC WITH DIFFERENTIAL/PLATELET  URINALYSIS, ROUTINE W REFLEX MICROSCOPIC  INFLUENZA PANEL BY PCR (TYPE A & B)    EKG  EKG Interpretation  Date/Time:  Tuesday July 11 2017 11:18:38 EST Ventricular Rate:   81 PR Interval:    QRS Duration: 89 QT Interval:  382 QTC Calculation: 444 R Axis:   48 Text Interpretation:  Sinus or ectopic atrial rhythm Prolonged PR interval Anteroseptal infarct, age indeterminate Normal sinus rhythm Confirmed by Zenovia Jarred [phone]) on 07/11/2017 1:02:50 PM       Radiology Dg Chest 2 View  Result Date: 07/11/2017 CLINICAL DATA:  Cough and chest congestion for the past week. Nonsmoker. History of aortic insufficiency, atrial fibrillation, ascending aortic replacement in the past nonsmoker. EXAM: CHEST  2 VIEWk COMPARISON:  Chest x-ray of Jan 10, 2017. FINDINGS: The lungs remain mildly hyperinflated with hemidiaphragm flattening. There is no focal infiltrate. There is stable biapical pleural thickening. There is no pleural effusion. The heart is normal in size. There are post median sternotomy changes. There is calcification in the wall of the aortic arch. The patient has undergone kyphoplasty in the midthoracic spine and in the lumbar spine. There is high-grade compression of the body of T12 which has not been treated with kyphoplasty. It is unchanged since a study of Jan 10, 2017. IMPRESSION: Chronic bronchitic-emphysematous changes. No pneumonia nor CHF. Post median sternotomy changes. Thoracic aortic atherosclerosis. Electronically Signed   By: David  Martinique M.D.   On: 07/11/2017 11:43    Procedures Procedures (including critical care time)  Medications Ordered in ED Medications  sodium chloride 0.9 % bolus 500 mL (500 mLs Intravenous New Bag/Given 07/11/17 1220)  acetaminophen (TYLENOL) tablet 650 mg (650 mg Oral Given 07/11/17 1326)     Initial Impression / Assessment and Plan / ED Course  I have reviewed the triage vital signs and the nursing notes.  Pertinent labs & imaging results that were available during my care of the patient were reviewed by me and considered in my medical decision making (see chart for details).      83 year old female  presenting here today with feelings of  fatigue.  Patient's been feeling poorly for the last 3-4 days.  Went to urgent care today run by her primary care's office, found to be tachypneic and appeared weak so was sent here for further evaluation.  Patient has had no real focal symptoms except mild cough and congestion.  No fevers.  No nausea vomiting diarrhea.  No abdominal pain no dysuria.  Reports decrease in appetite.  And not drinking fluids at home.  Since past medical history is significant for diastolic heart failure, aortic insufficiency, paroxysmal A. fib, thoracic  aortic aneurysm repair  Here patient saying that she would like to go home since her son-in-law has to work.   11:54 AM Will initiate broad-spectrum workup.  We will send flu, however she is outside the Tamiflu window.  Will get x-ray, labs.  Suspect dehydration, viral.   1:25 PM Patient's labs all very reassuring.  Patient not tachypneic here.  Patient ablated around the department faster than most other patients I seen.  Patient has ample energy.  Strict return precautions given however I do not see any reason to admit at this time.  Final Clinical Impressions(s) / ED Diagnoses   Final diagnoses:  Viral respiratory illness    ED Discharge Orders    None       Macarthur Critchley, MD 07/11/17 1326

## 2017-07-24 ENCOUNTER — Telehealth: Payer: Self-pay | Admitting: Family Medicine

## 2017-07-24 ENCOUNTER — Ambulatory Visit: Payer: Self-pay

## 2017-07-24 NOTE — Telephone Encounter (Signed)
Please advise 

## 2017-07-24 NOTE — Telephone Encounter (Signed)
Copied from [place]. Topic: Quick Communication - See Telephone Encounter >> Jul 24, 2017  8:59 AM Synthia Innocent wrote: CRM for notification. See Telephone encounter for: Patient states she has a UTI and would like to speak to nurse.  07/24/17.

## 2017-07-24 NOTE — Telephone Encounter (Signed)
Called and advised pt of PCP recommendations. She schedule a lab appointment for tomorrow.

## 2017-07-24 NOTE — Telephone Encounter (Signed)
Pt's last 2 UTIs were both highly resistant bacteria (July 2017, Dec 2017).  Based on this, pt will need an appt to leave a urine and make sure we are treating appropriately as the last 2 infections have been resistant to Keflex which she reports has always worked in the past.

## 2017-07-24 NOTE — Telephone Encounter (Signed)
Spoke with pt. States she has a UTI and refusing to be seen

## 2017-07-24 NOTE — Telephone Encounter (Signed)
Pt requesting an antibiotic to be called in stating " my doctor is familiar with my UTI symptoms". Advised that the doctor would need a urine sample but pt was insisting that she will not come in to be seen. Pt requesting a call back from the office and when/if abx can be called in.  Reason for Disposition . Urinating more frequently than usual (i.e., frequency)  Answer Assessment - Initial Assessment Questions 1. SYMPTOM: "What's the main symptom you're concerned about?" (e.g., frequency, incontinence)     Pain and urgency and incontinence 2. ONSET: "When did the  ________  start?"     1 week ago 3. PAIN: "Is there any pain?" If so, ask: "How bad is it?" (Scale: 1-10; mild, moderate, severe)   "discomfort" 2/10 with urination 4. CAUSE: "What do you think is causing the symptoms?"     Wears panty liner for bladder protection and thinks that is what is causing her pain 5. OTHER SYMPTOMS: "Do you have any other symptoms?" (e.g., fever, flank pain, blood in urine, pain with urination)     No fever, no flank pain, no blood in urine, "discomfort with urination" 6. PREGNANCY: "Is there any chance you are pregnant?" "When was your last menstrual period?"     n/a  Protocols used: URINARY [facility]

## 2017-07-24 NOTE — Telephone Encounter (Signed)
See other phone note for information

## 2017-07-25 ENCOUNTER — Other Ambulatory Visit (INDEPENDENT_AMBULATORY_CARE_PROVIDER_SITE_OTHER): Payer: Medicare Other

## 2017-07-25 DIAGNOSIS — R300 Dysuria: Secondary | ICD-10-CM | POA: Diagnosis not present

## 2017-07-25 LAB — POCT URINALYSIS DIPSTICK
BILIRUBIN UA: NEGATIVE
Glucose, UA: NEGATIVE
KETONES UA: NEGATIVE
LEUKOCYTES UA: NEGATIVE
Nitrite, UA: NEGATIVE
PH UA: 6 (ref 5.0–8.0)
RBC UA: NEGATIVE
Spec Grav, UA: >=1.03 — AB (ref 1.010–1.025)
Urobilinogen, UA: 0.2 E.U./dL

## 2017-07-28 ENCOUNTER — Other Ambulatory Visit: Payer: Self-pay | Admitting: General Practice

## 2017-07-28 ENCOUNTER — Other Ambulatory Visit: Payer: Self-pay | Admitting: *Deleted

## 2017-07-28 DIAGNOSIS — I712 Thoracic aortic aneurysm, without rupture, unspecified: Secondary | ICD-10-CM

## 2017-07-28 LAB — URINE CULTURE
MICRO NUMBER:: 81330959
SPECIMEN QUALITY: ADEQUATE

## 2017-07-28 MED ORDER — CEPHALEXIN 500 MG PO CAPS: 500.0000 mg | ORAL_CAPSULE | Freq: Two times a day (BID) | ORAL | 0 refills | Status: DC

## 2017-08-10 ENCOUNTER — Encounter: Payer: Medicare Other | Admitting: Family Medicine

## 2017-08-10 ENCOUNTER — Ambulatory Visit: Payer: Medicare Other

## 2017-09-06 ENCOUNTER — Other Ambulatory Visit: Payer: Self-pay | Admitting: Family Medicine

## 2017-09-06 ENCOUNTER — Other Ambulatory Visit: Payer: Self-pay | Admitting: Cardiovascular Disease

## 2017-09-07 ENCOUNTER — Ambulatory Visit
Admission: RE | Admit: 2017-09-07 | Discharge: 2017-09-07 | Disposition: A | Discharge status: Home / Self Care | Payer: Medicare Other | Source: Ambulatory Visit | Attending: Cardiothoracic Surgery | Admitting: Cardiothoracic Surgery

## 2017-09-07 ENCOUNTER — Other Ambulatory Visit: Payer: Self-pay

## 2017-09-07 ENCOUNTER — Ambulatory Visit: Payer: Medicare Other | Admitting: Cardiothoracic Surgery

## 2017-09-07 ENCOUNTER — Encounter: Payer: Self-pay | Admitting: Cardiothoracic Surgery

## 2017-09-07 VITALS — BP 156/79 | HR 80 | Resp 18 | Ht 66.0 in | Wt 119.8 lb

## 2017-09-07 DIAGNOSIS — I712 Thoracic aortic aneurysm, without rupture, unspecified: Secondary | ICD-10-CM

## 2017-09-07 DIAGNOSIS — I7122 Aneurysm of the aortic arch, without rupture: Secondary | ICD-10-CM

## 2017-09-07 MED ORDER — IOPAMIDOL (ISOVUE-370) INJECTION 76%
75.0000 mL | Freq: Once | INTRAVENOUS | Status: AC | PRN
  Administered 2017-09-07: 75 mL via INTRAVENOUS

## 2017-09-07 NOTE — Progress Notes (Signed)
[address] VillageSuite [address]       [place] [address]             [phone]          Bayan L [facility]ins [facility] Medical Record #[id number] Date of Birth: August 28, 1926  Referring: Sanda Klein, MD Primary Care: Midge Minium, MD  Chief Complaint:    Chief Complaint  Patient presents with  . Follow-up    s/p repair of ruptured ascending aortic aneurysm with Chest CT    History of Present Illness:    Patient returns today for followup CT scan of the chest after having repair of a ruptured ascending aortic aneurysm and dissection and resuspension of aortic valve done as an emergency 06/25/2010. Now 7 years postop she continues to be ambulatory and caring for herself. She has no overt symptoms of congestive heart failure. Denies pedal edema   Current Activity/ Functional Status: Walks with a cane, but otherwise is mobile   Past Medical History:  Diagnosis Date  . Acute blood loss anemia 2011   WITH ANEURYSM ONLY  . Afib ([facility])    -no   . Arthritis   . Cancer ([facility]) LATE 1970'S   skin cancer - arm squamous  . Compression fracture of body of thoracic vertebra ([facility])    T 5  . Compression fracture of L2 lumbar vertebra ([facility])   . GERD (gastroesophageal reflux disease)    "mild"  . Heart murmur   . History of atrial paroxysmal tachycardia   . History of blood transfusion   . History of hypothyroidism   . Hypertension   . Hypothyroidism   . Mitral valve prolapse   . Moderate aortic insufficiency 05/25/2014  . Osteoporosis   . Pericardial tamponade 05/2010  . Pneumonia    many years ago  . Premature atrial contractions   . Syncope    VERY RARE  . Thoracic aortic aneurysm ([facility]) 05/2010   Ruptured ascending  . UTI (lower urinary tract infection)    CHRONIC  . Volume overload 2011  . Wrist fracture    left    Past Surgical History:  Procedure Laterality Date  . ABLATION    . APPENDECTOMY    . BREAST SURGERY Right    right biospy  . Classic  atrioventricular nodal reentrant  10/20/2010  . COLONOSCOPY    . EUS N/A 02/18/2016   Procedure: UPPER ENDOSCOPIC ULTRASOUND (EUS) LINEAR;  Surgeon: Milus Banister, MD;  Location: [facility];  Service: Endoscopy;  Laterality: N/A;  . EYE SURGERY Bilateral 2003   cataract bilateral  . HERNIA REPAIR  1993   Right Inguinal  . KYPHOPLASTY  09/05/2012   Procedure: KYPHOPLASTY;  Surgeon: Winfield Cunas, MD;  Location: [facility];  Service: Neurosurgery;  Laterality: N/A;  L1 kyphoplasty  . KYPHOPLASTY N/A 04/01/2014   Procedure: KYPHOPLASTY;  Surgeon: Ashok Pall, MD;  Location: [facility];  Service: Neurosurgery;  Laterality: N/A;  Lumbar Four Kyphoplasty  . KYPHOPLASTY N/A 08/07/2015   Procedure: KYPHOPLASTY LUMBAR THREE;  Surgeon: Ashok Pall, MD;  Location: [facility];  Service: Neurosurgery;  Laterality: N/A;  . KYPHOPLASTY N/A 12/09/2015   Procedure: Lumbar five, KYPHOPLASTY;  Surgeon: Ashok Pall, MD;  Location: [facility];  Service: Neurosurgery;  Laterality: N/A;  L5 kyphoplasty  . KYPHOPLASTY N/A 01/13/2016   Procedure: Thoracic seven KYPHOPLASTY;  Surgeon: Ashok Pall, MD;  Location: [facility];  Service: Neurosurgery;  Laterality: N/A;  .  KYPHOPLASTY N/A 01/22/2016   Procedure: THORACIC EIGHT KYPHOPLASTY;  Surgeon: Ashok Pall, MD;  Location: [facility];  Service: Neurosurgery;  Laterality: N/A;  T8 KYPHOPLASTY  . KYPHOPLASTY N/A 12/16/2016   Procedure: KYPHOPLASTY THORACIC FIVE;  Surgeon: Ashok Pall, MD;  Location: [facility];  Service: Neurosurgery;  Laterality: N/A;  KYPHOPLASTY THORACIC 5  . L2 kyphoplasty using Kyphon system.  03/17/2009  . ORIF WRIST FRACTURE Left 08/17/2016   Procedure: OPEN REDUCTION INTERNAL FIXATION (ORIF) WRIST FRACTURE;  Surgeon: Roseanne Kaufman, MD;  Location: [facility];  Service: Orthopedics;  Laterality: Left;  block done as well   . PERCUTANEOUS PINNING WRIST FRACTURE Bilateral 1995   bil  . Replacement of ascending aorta and hemiarch and  resuspension of the aortic valve with circulatory cardiopulmonary bypass and circulatory arrest  06/25/2010   Dr Servando Snare  . Revision of left total hip replacement using some allograft  11/23/2010  . TOE OSTEOTOMY     Right 2nd   . TONSILLECTOMY    . TUBAL LIGATION  1966    Family History  Problem Relation Age of Onset  . Heart failure Mother   . COPD Mother        never smoker  . Stroke Father   . Hypertension Brother   . Hypertension Brother   . Hypertension Sister   . Hypertension Sister   . Stroke Sister   . Stomach cancer Unknown        nephew  . Breast cancer Unknown        [place]    Social History   Socioeconomic History  . Marital status: Widowed    Spouse name: Not on file  . Number of children: Not on file  . Years of education: Not on file  . Highest education level: Not on file  Social Needs  . Financial resource strain: Not on file  . Food insecurity - worry: Not on file  . Food insecurity - inability: Not on file  . Transportation needs - medical: Not on file  . Transportation needs - non-medical: Not on file  Occupational History  . Not on file  Tobacco Use  . Smoking status: Never Smoker  . Smokeless tobacco: Never Used  Substance and Sexual Activity  . Alcohol use: No  . Drug use: No  . Sexual activity: Not on file  Other Topics Concern  . Not on file  Social History Narrative  . Not on file    Social History   Tobacco Use  Smoking Status Never Smoker  Smokeless Tobacco Never Used    Social History   Substance and Sexual Activity  Alcohol Use No     No Known Allergies  Current Outpatient Medications  Medication Sig Dispense Refill  . amLODipine (NORVASC) 5 MG tablet Take 1 tablet (5 mg total) by mouth daily. 90 tablet 3  . aspirin EC 325 MG tablet Take 325 mg by mouth every evening.     . calcium-vitamin D (OSCAL WITH D) 500-200 MG-UNIT per tablet Take 1 tablet by mouth daily at 12 noon.    . cephALEXin (KEFLEX) 500 MG capsule  Take 1 capsule (500 mg total) by mouth 2 (two) times daily. 10 capsule 0  . cholecalciferol (VITAMIN D) 1000 UNITS tablet Take 2,000 Units by mouth daily at 12 noon.     . flecainide (TAMBOCOR) 50 MG tablet Take 0.5 tablets (25 mg total) by mouth 2 (two) times daily. 90 tablet 3  . levothyroxine (SYNTHROID, LEVOTHROID) 50  MCG tablet TAKE 1 TABLET BY MOUTH  EVERY MORNING 90 tablet 0  . losartan (COZAAR) 50 MG tablet Take 1 tablet (50mg ) by mouth in the morning.     No current facility-administered medications for this visit.        Physical Exam: BP (!) 156/79 (BP Location: Left Arm, Patient Position: Sitting, Cuff Size: Normal)   Pulse 80   Resp 18   Ht 5\' 6"  (1.676 m)   Wt 119 lb 12.8 oz (54.3 kg)   SpO2 98% Comment: RA  BMI 19.34 kg/m   General appearance: alert, cooperative and cachectic Neurologic: intact Heart: regular rate and rhythm and diastolic murmur: mid diastolic 2/6, decrescendo at 2nd left intercostal space Lungs: clear to auscultation bilaterally and normal percussion bilaterally Abdomen: soft, non-tender; bowel sounds normal; no masses,  no organomegaly Extremities: extremities normal, atraumatic, no cyanosis or edema, Homans sign is negative, no sign of DVT and Patient has pain in the left hip from recent fall on previously replaced Wound: Sternal incision is stable and well healed Murmur sounds unchanged from previous description  Diagnostic Studies & Laboratory data:     Recent Radiology Findings:  Ct Angio Chest Aorta W/cm &/or Wo/cm  Result Date: 09/07/2017 CLINICAL DATA:  History of repair of ruptured ascending thoracic aortic aneurysm and dissection with resuspension of aortic valve in 2011. EXAM: CT ANGIOGRAPHY CHEST WITH CONTRAST TECHNIQUE: Multidetector CT imaging of the chest was performed using the standard protocol during bolus administration of intravenous contrast. Multiplanar CT image reconstructions and MIPs were obtained to evaluate the vascular  anatomy. CONTRAST:  38mL ISOVUE-370 IOPAMIDOL (ISOVUE-370) INJECTION 76% Creatinine was obtained on site at [facility] at [address] Results: Creatinine 1.0 mg/dL.  Estimated GFR 49 mL/minute. COMPARISON:  08/04/2016 FINDINGS: Cardiovascular: Ascending thoracic aortic graft shows stable and normal patency without evidence of pseudoaneurysm. Just beyond the graft anastomosis, residual native aneurysmal dilatation of the distal ascending thoracic aorta and proximal arch remains stable and measures 5.8 cm in maximal diameter. The distal arch measures 3.5 cm. The descending thoracic aorta measures 2.7 cm. There is no evidence of aortic dissection. Proximal great vessels continue to show normal patency. The heart size is stable. No pericardial fluid is seen. Stable scattered calcified coronary artery plaque in the distribution of the LAD and left circumflex coronary arteries. Central pulmonary arteries are normal in caliber. Mediastinum/Nodes: No enlarged mediastinal, hilar, or axillary lymph nodes. The trachea and esophagus demonstrate no significant findings. Lungs/Pleura: Stable pulmonary scarring bilaterally including left apical pleuroparenchymal calcification. There is no evidence of pulmonary edema, consolidation, pneumothorax, nodule or pleural fluid. Upper Abdomen: No acute abnormality. Musculoskeletal: Stable evidence vertebral augmentation at multiple levels and old T12 compression fracture. Stable pectus excavatum deformity of the lower anterior chest wall. Review of the MIP images confirms the above findings. IMPRESSION: Stable patency of ascending thoracic aortic graft without evidence of anastomotic pseudoaneurysm or recurrent dissection. Residual native aneurysmal dilatation of the distal ascending thoracic aorta and proximal arch remains stable and measures 5.8 cm in maximal diameter. Aortic aneurysm NOS (ICD10-I71.9). Electronically Signed   By: Aletta Edouard M.D.   On: 09/07/2017  15:34   Ct Angio Chest Aorta W &/or Wo Contrast  Result Date: 08/04/2016 CLINICAL DATA:  Thoracic aortic aneurysm, status post graft repair 6 years ago. EXAM: CT ANGIOGRAPHY CHEST WITH CONTRAST TECHNIQUE: Multidetector CT imaging of the chest was performed using the standard protocol during bolus administration of intravenous contrast. Multiplanar CT image reconstructions and  MIPs were obtained to evaluate the vascular anatomy. CONTRAST:  75 cc Isovue 370 IV COMPARISON:  07/05/2015 FINDINGS: Cardiovascular: Status post graft repair of the ascending thoracic aorta. Continued dilatation. The proximal ascending aorta measures 4.6 cm, stable. The distal ascending aorta measures 5.8 cm compared to 6 cm previously. No change. No dissection. Heart is enlarged. Prior median sternotomy. Calcifications in the left main and left anterior descending coronary arteries. Mediastinum/Nodes: HO No mediastinal, hilar, or axillary adenopathy. Lungs/Pleura: Scarring in the apices, stable. No confluent airspace opacities, suspicious pulmonary nodules or pleural effusions. Upper Abdomen: Imaging into the upper abdomen shows no acute findings. Musculoskeletal: Chest wall soft tissues are unremarkable. No acute bony abnormality or focal bone lesion. Changes of multi level vertebral augmentation in the thoracic and upper lumbar spine. Stable severe compression fracture at T12 without vertebral augmentation changes. Review of the MIP images confirms the above findings. IMPRESSION: Stable dilatation of the ascending thoracic aorta, status post graft repair. No dissection. No change since prior study. Biapical scarring. Coronary artery disease. Multilevel compression fractures status post vertebral augmentation. Electronically Signed   By: Rolm Baptise M.D.   On: 08/04/2016 13:01   Ct Angio Chest Aorta W/cm &/or Wo/cm  07/15/2015  CLINICAL DATA:  Thoracic aortic aneurysm without rupture. EXAM: CT ANGIOGRAPHY CHEST WITH CONTRAST  TECHNIQUE: Multidetector CT imaging of the chest was performed using the standard protocol during bolus administration of intravenous contrast. Multiplanar CT image reconstructions and MIPs were obtained to evaluate the vascular anatomy. CONTRAST:  75 mL of Isovue 370 intravenously. COMPARISON:  CT scan of July 03, 2014. FINDINGS: Status post aortic graft placement extending from aortic valve to proximal aortic arch. The proximal ascending thoracic aorta measures 4.5 cm in maximum diameter which is stable compared to prior exam. The proximal portion of the transverse aortic arch measures 5.5 cm which is stable compared to prior exam. The proximal portion of the descending thoracic aorta measures 3.2 cm which is within normal limits. No dissection is noted. Great vessels are widely patent without stenosis. No pneumothorax or significant pleural effusion is noted. Stable biapical scarring is noted. No acute pulmonary disease is noted. No significant abnormality is noted in visualized portion of upper abdomen. No evidence of mediastinal mass or adenopathy is noted. Stable lower thoracic old compression fracture is noted. Review of the MIP images confirms the above findings. IMPRESSION: Status post aortic graft placement for treatment of thoracic aortic aneurysm. There is stable dilatation of the ascending thoracic aorta and proximal aortic arch compared to prior exam. No dissection or significant changes noted compared to prior exam. Electronically Signed   By: Marijo Conception, M.D.   On: 07/15/2015 13:49   Echo from June 2016 is reviewed, mild aortic insufficiency and mild mitral insufficiency   Recent Lab Findings: Lab Results  Component Value Date   WBC 5.1 07/11/2017   HGB 12.7 07/11/2017   HCT 39.5 07/11/2017   PLT 133 (L) 07/11/2017   GLUCOSE 100 (H) 07/11/2017   CHOL 157 02/13/2017   TRIG 110.0 02/13/2017   HDL 74.10 02/13/2017   LDLCALC 61 02/13/2017   ALT 30 07/11/2017   AST 63 (H)  07/11/2017   NA 137 07/11/2017   K 4.9 07/11/2017   CL 104 07/11/2017   CREATININE 1.05 (H) 07/11/2017   BUN 14 07/11/2017   CO2 26 07/11/2017   TSH 1.33 02/13/2017   INR 1.00 08/15/2016      Assessment / Plan:    Patient returns  to the office with a followup CTA of the chest now 9years and just past her 75 th birthday after emergency replacement of her acscending aorta resuspension of her aortic valve. Dilatation of her aortic arch remains unchanged, with her age and redo status direct treatment of her aortic arch would carry prohibitive risk, she is not interested in pursuing any surgical intervention but would like to continue to follow it I will see her back in one year with a follow-up CT scan of the chest Incidentally in the past year her sister is also been seen for aortic insufficiency and dilated ascending aorta, the patient notes that her children and other family members have been checked for aortic aneurysm  Grace Isaac MD  [place] Office [phone] 09/07/2017 3:15 PM

## 2017-09-08 NOTE — Progress Notes (Signed)
Remarkable lady. Thanks [employer]

## 2017-10-25 ENCOUNTER — Ambulatory Visit: Payer: Medicare Other

## 2017-10-25 ENCOUNTER — Encounter: Payer: Medicare Other | Admitting: Family Medicine

## 2017-10-30 ENCOUNTER — Ambulatory Visit: Payer: Medicare Other

## 2017-11-02 ENCOUNTER — Ambulatory Visit: Payer: Medicare Other | Admitting: Cardiovascular Disease

## 2017-11-02 ENCOUNTER — Encounter: Payer: Self-pay | Admitting: Cardiovascular Disease

## 2017-11-02 VITALS — BP 165/76 | HR 80 | Ht 66.0 in | Wt 118.4 lb

## 2017-11-02 DIAGNOSIS — Z79899 Other long term (current) drug therapy: Secondary | ICD-10-CM

## 2017-11-02 DIAGNOSIS — I471 Supraventricular tachycardia: Secondary | ICD-10-CM | POA: Diagnosis not present

## 2017-11-02 DIAGNOSIS — I351 Nonrheumatic aortic (valve) insufficiency: Secondary | ICD-10-CM

## 2017-11-02 DIAGNOSIS — I10 Essential (primary) hypertension: Secondary | ICD-10-CM | POA: Diagnosis not present

## 2017-11-02 DIAGNOSIS — I712 Thoracic aortic aneurysm, without rupture, unspecified: Secondary | ICD-10-CM

## 2017-11-02 DIAGNOSIS — I441 Atrioventricular block, second degree: Secondary | ICD-10-CM | POA: Diagnosis not present

## 2017-11-02 DIAGNOSIS — Z5181 Encounter for therapeutic drug level monitoring: Secondary | ICD-10-CM

## 2017-11-02 DIAGNOSIS — I341 Nonrheumatic mitral (valve) prolapse: Secondary | ICD-10-CM | POA: Diagnosis not present

## 2017-11-02 MED ORDER — LOSARTAN POTASSIUM 50 MG PO TABS: 25.0000 mg | ORAL_TABLET | Freq: Two times a day (BID) | ORAL | 3 refills | Status: AC

## 2017-11-02 NOTE — Progress Notes (Signed)
Cardiology Office Note    Date:  11/02/2017   ID:  PASTY MANNINEN, DOB 1926-06-21, MRN [id number]  PCP:  Midge Minium, MD  Cardiologist:   Sanda Klein, MD   Chief complaint: Dyspnea  History of Present Illness:  Yvonne [name] is a 84 y.o. female with a history of repaired rupture of the ascending aortic aneurysm, paroxysmal atrial tachycardia, mitral valve prolapse and second-degree AV block Mobitz type I. She has residual moderate aortic insufficiency following the aortic valve resuspension at the time of her aneurysm repair.  She has occasional very brief dizziness with changes in position but has not had palpitations or syncope.  She brings a detailed list of her home blood pressure recordings which are consistently elevated values 152-172/65-84.  However, I checked her blood pressure repeatedly myself in the clinic today.  The nurse initially checked her blood pressure 165/76.  When I saw her at the pressure in her right arm was 112/62 and in the left arm 108/54.  After standing for just a minute the blood pressure in her right arm was as low as 102/45 mmHg.  Past chest pain or dyspnea and has not had problems with leg edema in a long time.  In 2011 she had surgical repair of a ruptured ascending aortic aneurysm and resuspension of the native aortic valve. 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 Followed by Dr. Ceasar Mons. She had problems with paroxysmal atrial tachycardia and had a successful ablation by Dr. Thompson Grayer. Following that she was still troubled by frequent symptomatic PACs and continues to take treatment with flecainide. She has excellent symptom relief. Beta blockers had to be discontinued after she develops symptomatic second degree Mobitz type I AV block that led to syncope. Hypertension is controlled on a combination of ARB and dihydropyridine calcium channel  blocker. She has not had atrial fibrillation.  Past Medical History:  Diagnosis Date  . Acute blood loss anemia 2011   WITH ANEURYSM ONLY  . Afib ([place])    -no   . Arthritis   . Cancer ([place]) LATE 1970'S   skin cancer - arm squamous  . Compression fracture of body of thoracic vertebra ([place])    T 5  . Compression fracture of L2 lumbar vertebra ([facility])   . GERD (gastroesophageal reflux disease)    "mild"  . Heart murmur   . History of atrial paroxysmal tachycardia   . History of blood transfusion   . History of hypothyroidism   . Hypertension   . Hypothyroidism   . Mitral valve prolapse   . Moderate aortic insufficiency 05/25/2014  . Osteoporosis   . Pericardial tamponade 05/2010  . Pneumonia    many years ago  . Premature atrial contractions   . Syncope    VERY RARE  . Thoracic aortic aneurysm ([place]) 05/2010   Ruptured ascending  . UTI (lower urinary tract infection)    CHRONIC  . Volume overload 2011  . Wrist fracture    left    Past Surgical History:  Procedure Laterality Date  . ABLATION    . APPENDECTOMY    . BREAST SURGERY Right    right biospy  . Classic atrioventricular nodal reentrant  10/20/2010  . COLONOSCOPY    . EUS N/A 02/18/2016   Procedure: UPPER ENDOSCOPIC ULTRASOUND (EUS) LINEAR;  Surgeon: Milus Banister, MD;  Location: [facility];  Service: Endoscopy;  Laterality: N/A;  . EYE SURGERY Bilateral 2003   cataract bilateral  . HERNIA REPAIR  1993   Right Inguinal  . KYPHOPLASTY  09/05/2012   Procedure: KYPHOPLASTY;  Surgeon: Winfield Cunas, MD;  Location: [facility];  Service: Neurosurgery;  Laterality: N/A;  L1 kyphoplasty  . KYPHOPLASTY N/A 04/01/2014   Procedure: KYPHOPLASTY;  Surgeon: Ashok Pall, MD;  Location: [facility];  Service: Neurosurgery;  Laterality: N/A;  Lumbar Four Kyphoplasty  . KYPHOPLASTY N/A 08/07/2015   Procedure: KYPHOPLASTY LUMBAR THREE;  Surgeon: Ashok Pall, MD;  Location: [facility];  Service: Neurosurgery;  Laterality:  N/A;  . KYPHOPLASTY N/A 12/09/2015   Procedure: Lumbar five, KYPHOPLASTY;  Surgeon: Ashok Pall, MD;  Location: [facility];  Service: Neurosurgery;  Laterality: N/A;  L5 kyphoplasty  . KYPHOPLASTY N/A 01/13/2016   Procedure: Thoracic seven KYPHOPLASTY;  Surgeon: Ashok Pall, MD;  Location: [place] [facility];  Service: Neurosurgery;  Laterality: N/A;  . KYPHOPLASTY N/A 01/22/2016   Procedure: THORACIC EIGHT KYPHOPLASTY;  Surgeon: Ashok Pall, MD;  Location: [facility];  Service: Neurosurgery;  Laterality: N/A;  T8 KYPHOPLASTY  . KYPHOPLASTY N/A 12/16/2016   Procedure: KYPHOPLASTY THORACIC FIVE;  Surgeon: Ashok Pall, MD;  Location: [facility];  Service: Neurosurgery;  Laterality: N/A;  KYPHOPLASTY THORACIC 5  . L2 kyphoplasty using Kyphon system.  03/17/2009  . ORIF WRIST FRACTURE Left 08/17/2016   Procedure: OPEN REDUCTION INTERNAL FIXATION (ORIF) WRIST FRACTURE;  Surgeon: Roseanne Kaufman, MD;  Location: [facility];  Service: Orthopedics;  Laterality: Left;  block done as well   . PERCUTANEOUS PINNING WRIST FRACTURE Bilateral 1995   bil  . Replacement of ascending aorta and hemiarch and resuspension of the aortic valve with circulatory cardiopulmonary bypass and circulatory arrest  06/25/2010   Dr Servando Snare  . Revision of left total hip replacement using some allograft  11/23/2010  . TOE OSTEOTOMY     Right 2nd   . TONSILLECTOMY    . TUBAL LIGATION  1966    Current Medications: Outpatient Medications Prior to Visit  Medication Sig Dispense Refill  . amLODipine (NORVASC) 5 MG tablet Take 1 tablet (5 mg total) by mouth daily. 90 tablet 3  . aspirin EC 325 MG tablet Take 325 mg by mouth every evening.     . calcium-vitamin D (OSCAL WITH D) 500-200 MG-UNIT per tablet Take 1 tablet by mouth daily at 12 noon.    . cholecalciferol (VITAMIN D) 1000 UNITS tablet Take 2,000 Units by mouth daily at 12 noon.     . flecainide (TAMBOCOR) 50 MG tablet Take 0.5 tablets (25 mg total) by mouth 2 (two) times daily.  90 tablet 3  . levothyroxine (SYNTHROID, LEVOTHROID) 50 MCG tablet TAKE 1 TABLET BY MOUTH  EVERY MORNING 90 tablet 0  . cephALEXin (KEFLEX) 500 MG capsule Take 1 capsule (500 mg total) by mouth 2 (two) times daily. 10 capsule 0  . losartan (COZAAR) 50 MG tablet Take 1 tablet (50mg ) by mouth in the morning.     No facility-administered medications prior to visit.      Allergies:   Patient has no known allergies.   Social History   Socioeconomic History  . Marital status: Widowed    Spouse name: None  . Number of children: None  . Years of education: None  . Highest education level: None  Social Needs  . Financial resource strain: None  . Food insecurity - worry: None  . Food  insecurity - inability: None  . Transportation needs - medical: None  . Transportation needs - non-medical: None  Occupational History  . None  Tobacco Use  . Smoking status: Never Smoker  . Smokeless tobacco: Never Used  Substance and Sexual Activity  . Alcohol use: No  . Drug use: No  . Sexual activity: None  Other Topics Concern  . None  Social History Narrative  . None     Family History:  The patient's family history includes Breast cancer in her unknown relative; COPD in her mother; Heart failure in her mother; Hypertension in her brother, brother, sister, and sister; Stomach cancer in her unknown relative; Stroke in her father and sister. She is one of 3 siblings from a group of 9 siblings that have aneurysms of the thoracic aorta. Her children have all had scans that did not show evidence of aneurysm.  ROS:   Please see the history of present illness.    ROS All other systems reviewed and are negative.    PHYSICAL EXAM:   VS:  BP (!) 165/76   Pulse 80   Ht 5\' 6"  (1.676 m)   Wt 118 lb 6.4 oz (53.7 kg)   BMI 19.11 kg/m      General: Alert, oriented x3, no distress, very slender, appears frail Head: no evidence of trauma, PERRL, EOMI, no exophtalmos or lid lag, no myxedema, no  xanthelasma; normal ears, nose and oropharynx Neck: normal jugular venous pulsations and no hepatojugular reflux; brisk carotid pulses without delay and no carotid bruits Chest: clear to auscultation, no signs of consolidation by percussion or palpation, normal fremitus, symmetrical and full respiratory excursions Cardiovascular: normal position and quality of the apical impulse, regular rhythm, normal first and second heart sounds, 1-2/6 early peaking aortic ejection murmur, 2/6 decrescendo diastolic murmur in the aortic focus, no apical murmurs, rubs or gallops. Midsystolic click is present. Abdomen: no tenderness or distention, no masses by palpation, no abnormal pulsatility or arterial bruits, normal bowel sounds, no hepatosplenomegaly Extremities: no clubbing, cyanosis or edema; 2+ radial, ulnar and brachial pulses bilaterally; 2+ right femoral, posterior tibial and dorsalis pedis pulses; 2+ left femoral, posterior tibial and dorsalis pedis pulses; no subclavian or femoral bruits Neurological: grossly nonfocal Psych: Normal mood and affect    Wt Readings from Last 3 Encounters:  11/02/17 118 lb 6.4 oz (53.7 kg)  09/07/17 119 lb 12.8 oz (54.3 kg)  07/11/17 121 lb (54.9 kg)      Studies/Labs Reviewed:   ECHO: 02/02/2017  - Left ventricle: The cavity size was normal. There was moderate   concentric hypertrophy. Systolic function was normal. The   estimated ejection fraction was in the range of 60% to 65%. Wall   motion was normal; there were no regional wall motion   abnormalities. Features are consistent with a pseudonormal left   ventricular filling pattern, with concomitant abnormal relaxation   and increased filling pressure (grade 2 diastolic dysfunction). - Aortic valve: Poorly visualized. Moderate diffuse thickening and   calcification. There was moderate regurgitation. Regurgitation   pressure half-time: 326 ms. - Mitral valve: Calcified annulus. Moderate diffuse  thickening and   calcification of the anterior leaflet, with mild involvement of   chords.  EKG:  EKG is ordered today.  It shows normal sinus rhythm with first-degree AV block ventricular hypertrophy with prominent repolarization abnormalities in the lateral leads QRS 88 ms, QTC 433 ms  Recent Labs: 02/13/2017: TSH 1.33 07/11/2017: ALT 30; B Natriuretic Peptide 341.5;  BUN 14; Creatinine, Ser 1.05; Hemoglobin 12.7; Platelets 133; Potassium 4.9; Sodium 137    ASSESSMENT:    1. White coat syndrome with hypertension   2. SVT (supraventricular tachycardia) ([place])   3. Encounter for monitoring flecainide therapy   4. Second degree AV block   5. Thoracic aortic aneurysm without rupture ([place])   6. Nonrheumatic aortic valve insufficiency   7. Essential hypertension   8. MVP (mitral valve prolapse)      PLAN:  In order of problems listed above:   1. SVT/PACs: Has excellent symptom relief with flecainide and no evidence of toxicity by ECG 2. Flecainide: Narrow QRS, normal QT 3. 2nd deg AVB: Was the reason she has been taken off beta-blockers, no recurrence since, no indication for pacemaker at this time 4. Ao aneurysm: Genetic/familial disorder in her case. Unable to take beta blockers due to symptomatic bradycardia. 5. AR: Stable/moderate aortic insufficiency since resuspension of her aortic valve, without evidence of left ventricular dilation or dysfunction 6. HTN: Puzzling change in her blood pressure.  Her symptoms are more consistent with low blood pressure rather than high.  We will check a 24-hour blood pressure monitor and asked her to use her home cuff a few times during that 24-hour recording to compare the results.  Split the losartan to 2 equal daily doses 7. MVP: Prominent click, but no murmur and no evidence of significant regurgitation on her recent echo     Medication Adjustments/Labs and Tests Ordered: Current medicines are reviewed at length with the patient today.   Concerns regarding medicines are outlined above.  Medication changes, Labs and Tests ordered today are listed in the Patient Instructions below. Patient Instructions  Dr Sallyanne Kuster has recommended making the following medication changes: 1. CHANGE Losartan - take 0.5 tablet (25 mg total) by mouth twice daily  Your physician has recommended that you wear a 24 hour blood pressure monitor. This will be placed on at our [facility] location. [address] [place] [address] [phone]  Dr Sallyanne Kuster recommends that you schedule a follow-up appointment in 6 months. You will receive a reminder letter in the mail two months in advance. If you don't receive a letter, please call our office to schedule the follow-up appointment.  If you need a refill on your cardiac medications before your next appointment, please call your pharmacy.    Signed, Sanda Klein, MD  11/02/2017 6:08 PM    [facility] [address], [place], [place]  [address] Phone: [phone]; Fax: [phone]

## 2017-11-02 NOTE — Patient Instructions (Signed)
Dr Sallyanne Kuster has recommended making the following medication changes: 1. CHANGE Losartan - take 0.5 tablet (25 mg total) by mouth twice daily  Your physician has recommended that you wear a 24 hour blood pressure monitor. This will be placed on at our [facility] location. [address], [place] [address] [phone]  Dr Sallyanne Kuster recommends that you schedule a follow-up appointment in 6 months. You will receive a reminder letter in the mail two months in advance. If you don't receive a letter, please call our office to schedule the follow-up appointment.  If you need a refill on your cardiac medications before your next appointment, please call your pharmacy.

## 2017-11-21 NOTE — Progress Notes (Addendum)
Subjective:   Yvonne [name] is a 84 y.o. female who presents for Medicare Annual (Subsequent) preventive examination.  Review of Systems:  No ROS.  Medicare Wellness Visit. Additional risk factors are reflected in the social history.  Cardiac Risk Factors include: advanced age (>105men, >70 women);hypertension   Sleep patterns: Occasional sleep issues (staying asleep).  Home Safety/Smoke Alarms: Feels safe in home. Smoke alarms in place.  Living environment; residence and Firearm Safety: Lives in basement apt in daughters home.  Seat Belt Safety/Bike Helmet: Wears seat belt.   Female:   Pap-N/A       Mammo-No further testing.   Dexa scan-No further testing.  CCS-Aged out      Objective:     Vitals: BP (!) 152/72 (BP Location: Left Arm, Patient Position: Sitting, Cuff Size: Normal)   Pulse 75   Temp (!) 97.5 F (36.4 C) (Temporal)   Resp 16   Ht 5\' 6"  (1.676 m)   Wt 119 lb 9.6 oz (54.3 kg)   SpO2 95%   BMI 19.30 kg/m   Body mass index is 19.3 kg/m.  Advanced Directives 11/22/2017 07/11/2017 08/16/2016 02/18/2016 02/11/2016 01/13/2016 08/07/2015  Does Patient Have a Medical Advance Directive? Yes Yes Yes Yes Yes Yes Yes  Type of Advance Directive Living will;Healthcare Power of Attorney Living will;Healthcare Power of [place];Living will [place];Living will Living will;Healthcare Power of [place];Living will [place];Living will  Does patient want to make changes to medical advance directive? - - No - Patient declined - No - Patient declined No - Patient declined -  Copy of [place] in Chart? No - copy requested No - copy requested - Yes Yes Yes Yes    Tobacco Social History   Tobacco Use  Smoking Status Never Smoker  Smokeless Tobacco Never Used     Counseling given: Not Answered    Past Medical History:  Diagnosis Date  . Acute blood loss  anemia 2011   WITH ANEURYSM ONLY  . Afib ([place])    -no   . Arthritis   . Cancer ([place]) LATE 1970'S   skin cancer - arm squamous  . Compression fracture of body of thoracic vertebra ([place])    T 5  . Compression fracture of L2 lumbar vertebra ([facility])   . GERD (gastroesophageal reflux disease)    "mild"  . Heart murmur   . History of atrial paroxysmal tachycardia   . History of blood transfusion   . History of hypothyroidism   . Hypertension   . Hypothyroidism   . Mitral valve prolapse   . Moderate aortic insufficiency 05/25/2014  . Osteoporosis   . Pericardial tamponade 05/2010  . Pneumonia    many years ago  . Premature atrial contractions   . Syncope    VERY RARE  . Thoracic aortic aneurysm ([place]) 05/2010   Ruptured ascending  . UTI (lower urinary tract infection)    CHRONIC  . Volume overload 2011  . Wrist fracture    left   Past Surgical History:  Procedure Laterality Date  . ABLATION    . APPENDECTOMY    . BREAST SURGERY Right    right biospy  . Classic atrioventricular nodal reentrant  10/20/2010  . COLONOSCOPY    . EUS N/A 02/18/2016   Procedure: UPPER ENDOSCOPIC ULTRASOUND (EUS) LINEAR;  Surgeon: Milus Banister, MD;  Location: [facility];  Service: Endoscopy;  Laterality: N/A;  .  EYE SURGERY Bilateral 2003   cataract bilateral  . HERNIA REPAIR  1993   Right Inguinal  . KYPHOPLASTY  09/05/2012   Procedure: KYPHOPLASTY;  Surgeon: Winfield Cunas, MD;  Location: [facility];  Service: Neurosurgery;  Laterality: N/A;  L1 kyphoplasty  . KYPHOPLASTY N/A 04/01/2014   Procedure: KYPHOPLASTY;  Surgeon: Ashok Pall, MD;  Location: [facility];  Service: Neurosurgery;  Laterality: N/A;  Lumbar Four Kyphoplasty  . KYPHOPLASTY N/A 08/07/2015   Procedure: KYPHOPLASTY LUMBAR THREE;  Surgeon: Ashok Pall, MD;  Location: [place] [facility];  Service: Neurosurgery;  Laterality: N/A;  . KYPHOPLASTY N/A 12/09/2015   Procedure: Lumbar five, KYPHOPLASTY;  Surgeon: Ashok Pall, MD;   Location: [facility];  Service: Neurosurgery;  Laterality: N/A;  L5 kyphoplasty  . KYPHOPLASTY N/A 01/13/2016   Procedure: Thoracic seven KYPHOPLASTY;  Surgeon: Ashok Pall, MD;  Location: [facility];  Service: Neurosurgery;  Laterality: N/A;  . KYPHOPLASTY N/A 01/22/2016   Procedure: THORACIC EIGHT KYPHOPLASTY;  Surgeon: Ashok Pall, MD;  Location: [facility];  Service: Neurosurgery;  Laterality: N/A;  T8 KYPHOPLASTY  . KYPHOPLASTY N/A 12/16/2016   Procedure: KYPHOPLASTY THORACIC FIVE;  Surgeon: Ashok Pall, MD;  Location: [facility];  Service: Neurosurgery;  Laterality: N/A;  KYPHOPLASTY THORACIC 5  . L2 kyphoplasty using Kyphon system.  03/17/2009  . ORIF WRIST FRACTURE Left 08/17/2016   Procedure: OPEN REDUCTION INTERNAL FIXATION (ORIF) WRIST FRACTURE;  Surgeon: Roseanne Kaufman, MD;  Location: [facility];  Service: Orthopedics;  Laterality: Left;  block done as well   . PERCUTANEOUS PINNING WRIST FRACTURE Bilateral 1995   bil  . Replacement of ascending aorta and hemiarch and resuspension of the aortic valve with circulatory cardiopulmonary bypass and circulatory arrest  06/25/2010   Dr Servando Snare  . Revision of left total hip replacement using some allograft  11/23/2010  . TOE OSTEOTOMY     Right 2nd   . TONSILLECTOMY    . TUBAL LIGATION  1966   Family History  Problem Relation Age of Onset  . Heart failure Mother   . COPD Mother        never smoker  . Stroke Father   . Aneurysm Sister   . Hypertension Sister   . Hypertension Sister   . Hypertension Sister   . Hypertension Brother   . Hypertension Brother   . Hypertension Brother   . Hypertension Sister   . Stroke Sister   . Stomach cancer Unknown        nephew  . Breast cancer Unknown        [place]   Social History   Socioeconomic History  . Marital status: Widowed    Spouse name: Not on file  . Number of children: Not on file  . Years of education: Not on file  . Highest education level: Not on file  Occupational  History  . Not on file  Social Needs  . Financial resource strain: Not on file  . Food insecurity:    Worry: Not on file    Inability: Not on file  . Transportation needs:    Medical: Not on file    Non-medical: Not on file  Tobacco Use  . Smoking status: Never Smoker  . Smokeless tobacco: Never Used  Substance and Sexual Activity  . Alcohol use: No  . Drug use: No  . Sexual activity: Not on file  Lifestyle  . Physical activity:    Days per week: Not on file  Minutes per session: Not on file  . Stress: Not on file  Relationships  . Social connections:    Talks on phone: Not on file    Gets together: Not on file    Attends religious service: Not on file    Active member of club or organization: Not on file    Attends meetings of clubs or organizations: Not on file    Relationship status: Not on file  Other Topics Concern  . Not on file  Social History Narrative  . Not on file    Outpatient Encounter Medications as of 11/22/2017  Medication Sig  . amLODipine (NORVASC) 5 MG tablet Take 1 tablet (5 mg total) by mouth daily.  Marland Kitchen aspirin EC 325 MG tablet Take 325 mg by mouth every evening.   . flecainide (TAMBOCOR) 50 MG tablet Take 0.5 tablets (25 mg total) by mouth 2 (two) times daily.  Marland Kitchen levothyroxine (SYNTHROID, LEVOTHROID) 50 MCG tablet TAKE 1 TABLET BY MOUTH  EVERY MORNING  . losartan (COZAAR) 50 MG tablet Take 0.5 tablets (25 mg total) by mouth 2 (two) times daily.  . calcium-vitamin D (OSCAL WITH D) 500-200 MG-UNIT per tablet Take 1 tablet by mouth daily at 12 noon.  . cholecalciferol (VITAMIN D) 1000 UNITS tablet Take 2,000 Units by mouth daily at 12 noon.   . Zoster Vaccine Adjuvanted [facility]) injection Inject 0.5 mLs into the muscle once for 1 dose.   No facility-administered encounter medications on file as of 11/22/2017.     Activities of Daily Living In your present state of health, do you have any difficulty performing the following activities: 11/22/2017  11/22/2017  Hearing? N N  Vision? N N  Difficulty concentrating or making decisions? N N  Walking or climbing stairs? Y Y  Comment - problem with stairs  Dressing or bathing? N N  Doing errands, shopping? N N  Preparing Food and eating ? - N  Using the Toilet? - N  In the past six months, have you accidently leaked urine? - Y  Do you have problems with loss of bowel control? - Y  Managing your Medications? - N  Managing your Finances? - N  Housekeeping or managing your Housekeeping? - N  Some recent data might be hidden    Patient Care Team: Midge Minium, MD as PCP - General (Family Medicine) Grace Isaac, MD (Cardiothoracic Surgery) Thompson Grayer, MD (Cardiology) Croitoru, Dani Gobble, MD (Cardiology) Paralee Cancel, MD as Consulting Physician (Orthopedic Surgery) Maisie Fus, MD as Consulting Physician (Obstetrics and Gynecology)    Assessment:   This is a routine wellness examination for Hanna.  Exercise Activities and Dietary recommendations Current Exercise Habits: The patient does not participate in regular exercise at present(stays active with church activity), Exercise limited by: orthopedic condition(s)   Diet (meal preparation, eat out, water intake, caffeinated beverages, dairy products, fruits and vegetables): Drinks hot tea, coffee and water.   Breakfast: cereal, coffee, juice Lunch: pb cracker Dinner: soup, pasta   Goals    . Patient Stated     Maintain current health.        Fall Risk Fall Risk  11/22/2017 11/22/2017 12/06/2016 08/08/2016 12/28/2015  Falls in the past year? No No No No No    Depression Screen PHQ 2/9 Scores 11/22/2017 11/22/2017 12/06/2016 08/08/2016  PHQ - 2 Score 0 0 0 0  PHQ- 9 Score 0 - - 0     Cognitive Function MMSE - Mini Mental State Exam 11/22/2017  Orientation to time 5  Orientation to Place 5  Registration 3  Attention/ Calculation 5  Recall 3  Language- name 2 objects 2  Language- repeat 1  Language- follow 3  step command 3  Language- read & follow direction 1  Write a sentence 1  Copy design 1  Total score 30        Immunization History  Administered Date(s) Administered  . Influenza, High Dose Seasonal PF 06/08/2016, 05/31/2017, 06/29/2017  . Pneumococcal Conjugate-13 12/28/2014  . Pneumococcal Polysaccharide-23 12/28/2011  . Tdap 08/15/2016  . Zoster 12/27/2013     Screening Tests Health Maintenance  Topic Date Due  . DEXA SCAN  11/23/2018 (Originally 07/04/1993)  . TETANUS/TDAP  08/15/2026  . INFLUENZA VACCINE  Completed  . PNA vac Low Risk Adult  Completed        Plan:     Shingles vaccine at pharmacy.   Bring a copy of your living will and/or healthcare power of attorney to your next office visit.  Continue doing brain stimulating activities (puzzles, reading, adult coloring books, staying active) to keep memory sharp.   I have personally reviewed and noted the following in the patient's chart:   . Medical and social history . Use of alcohol, tobacco or illicit drugs  . Current medications and supplements . Functional ability and status . Nutritional status . Physical activity . Advanced directives . List of other physicians . Hospitalizations, surgeries, and ER visits in previous 12 months . Vitals . Screenings to include cognitive, depression, and falls . Referrals and appointments  In addition, I have reviewed and discussed with patient certain preventive protocols, quality metrics, and best practice recommendations. A written personalized care plan for preventive services as well as general preventive health recommendations were provided to patient.     Gerilyn Nestle, RN  11/22/2017  Reviewed documentation provided by RN and agree w/ above.  Annye Asa, MD

## 2017-11-22 ENCOUNTER — Ambulatory Visit (INDEPENDENT_AMBULATORY_CARE_PROVIDER_SITE_OTHER): Payer: Medicare Other

## 2017-11-22 ENCOUNTER — Encounter: Payer: Self-pay | Admitting: Family Medicine

## 2017-11-22 ENCOUNTER — Ambulatory Visit (INDEPENDENT_AMBULATORY_CARE_PROVIDER_SITE_OTHER): Payer: Medicare Other | Admitting: Family Medicine

## 2017-11-22 ENCOUNTER — Other Ambulatory Visit: Payer: Self-pay

## 2017-11-22 VITALS — BP 152/72 | HR 75 | Temp 97.5°F | Resp 16 | Ht 66.0 in | Wt 119.6 lb

## 2017-11-22 VITALS — BP 152/72 | HR 75 | Temp 97.5°F | Resp 16 | Ht 66.0 in | Wt 119.4 lb

## 2017-11-22 DIAGNOSIS — E039 Hypothyroidism, unspecified: Secondary | ICD-10-CM | POA: Diagnosis not present

## 2017-11-22 DIAGNOSIS — E440 Moderate protein-calorie malnutrition: Secondary | ICD-10-CM

## 2017-11-22 DIAGNOSIS — M810 Age-related osteoporosis without current pathological fracture: Secondary | ICD-10-CM | POA: Diagnosis not present

## 2017-11-22 DIAGNOSIS — Z23 Encounter for immunization: Secondary | ICD-10-CM

## 2017-11-22 DIAGNOSIS — Z0000 Encounter for general adult medical examination without abnormal findings: Secondary | ICD-10-CM

## 2017-11-22 LAB — BASIC METABOLIC PANEL
BUN: 19 mg/dL (ref 6–23)
CALCIUM: 9.4 mg/dL (ref 8.4–10.5)
CHLORIDE: 103 meq/L (ref 96–112)
CO2: 29 mEq/L (ref 19–32)
Creatinine, Ser: 0.92 mg/dL (ref 0.40–1.20)
GFR: 61.04 mL/min (ref >60.00)
Glucose, Bld: 105 mg/dL — ABNORMAL HIGH (ref 70–99)
POTASSIUM: 4.8 meq/L (ref 3.5–5.1)
SODIUM: 139 meq/L (ref 135–145)

## 2017-11-22 LAB — CBC WITH DIFFERENTIAL/PLATELET
BASOS PCT: 0.5 % (ref 0.0–3.0)
Basophils Absolute: 0 10*3/uL (ref 0.0–0.1)
EOS PCT: 0.9 % (ref 0.0–5.0)
Eosinophils Absolute: 0.1 10*3/uL (ref 0.0–0.7)
HCT: 45 % (ref 36.0–46.0)
Hemoglobin: 14.8 g/dL (ref 12.0–15.0)
Lymphocytes Relative: 19.6 % (ref 12.0–46.0)
Lymphs Abs: 1.1 10*3/uL (ref 0.7–4.0)
MCHC: 32.9 g/dL (ref 30.0–36.0)
MCV: 93.4 fl (ref 78.0–100.0)
Monocytes Absolute: 0.3 10*3/uL (ref 0.1–1.0)
Monocytes Relative: 5.8 % (ref 3.0–12.0)
Neutro Abs: 4.2 10*3/uL (ref 1.4–7.7)
Neutrophils Relative %: 73.2 % (ref 43.0–77.0)
Platelets: 174 10*3/uL (ref 150.0–400.0)
RBC: 4.82 Mil/uL (ref 3.87–5.11)
RDW: 15.2 % (ref 11.5–15.5)
WBC: 5.7 10*3/uL (ref 4.0–10.5)

## 2017-11-22 LAB — LIPID PANEL
Cholesterol: 170 mg/dL (ref 0–200)
HDL: 94.2 mg/dL (ref >39.00)
LDL Cholesterol: 60 mg/dL (ref 0–99)
NONHDL: 76.03
Total CHOL/HDL Ratio: 2
Triglycerides: 79 mg/dL (ref 0.0–149.0)
VLDL: 15.8 mg/dL (ref 0.0–40.0)

## 2017-11-22 LAB — HEPATIC FUNCTION PANEL
ALT: 8 U/L (ref 0–35)
AST: 17 U/L (ref 0–37)
Albumin: 4.2 g/dL (ref 3.5–5.2)
Alkaline Phosphatase: 77 U/L (ref 39–117)
BILIRUBIN DIRECT: 0.2 mg/dL (ref 0.0–0.3)
BILIRUBIN TOTAL: 1.4 mg/dL — AB (ref 0.2–1.2)
Total Protein: 7.3 g/dL (ref 6.0–8.3)

## 2017-11-22 LAB — TSH: TSH: 1.73 u[IU]/mL (ref 0.35–4.50)

## 2017-11-22 LAB — VITAMIN D 25 HYDROXY (VIT D DEFICIENCY, FRACTURES): VITD: 38.24 ng/mL (ref 30.00–100.00)

## 2017-11-22 MED ORDER — ZOSTER VAC RECOMB ADJUVANTED 50 MCG/0.5ML IM SUSR
0.5000 mL | Freq: Once | INTRAMUSCULAR | 1 refills | Status: AC
Start: 2017-11-22 — End: 2017-11-22

## 2017-11-22 NOTE — Assessment & Plan Note (Signed)
Chronic problem.  Currently asymptomatic.  Check labs.  Adjust meds prn  

## 2017-11-22 NOTE — Assessment & Plan Note (Signed)
Pt's PE unchanged from previous and WNL w/ exception of her frailty.  Pt has aged out of colonoscopy and declines mammo, DEXA.  UTD on immunizations.  Check labs.  Anticipatory guidance provided.

## 2017-11-22 NOTE — Patient Instructions (Addendum)
Shingles vaccine at pharmacy.   Bring a copy of your living will and/or healthcare power of attorney to your next office visit.  Continue doing brain stimulating activities (puzzles, reading, adult coloring books, staying active) to keep memory sharp.   Health Maintenance, Female Adopting a healthy lifestyle and getting preventive care can go a long way to promote health and wellness. Talk with your health care provider about what schedule of regular examinations is right for you. This is a good chance for you to check in with your provider about disease prevention and staying healthy. In between checkups, there are plenty of things you can do on your own. Experts have done a lot of research about which lifestyle changes and preventive measures are most likely to keep you healthy. Ask your health care provider for more information. Weight and diet Eat a healthy diet  Be sure to include plenty of vegetables, fruits, low-fat dairy products, and lean protein.  Do not eat a lot of foods high in solid fats, added sugars, or salt.  Get regular exercise. This is one of the most important things you can do for your health. ? Most adults should exercise for at least 150 minutes each week. The exercise should increase your heart rate and make you sweat (moderate-intensity exercise). ? Most adults should also do strengthening exercises at least twice a week. This is in addition to the moderate-intensity exercise.  Maintain a healthy weight  Body mass index (BMI) is a measurement that can be used to identify possible weight problems. It estimates body fat based on height and weight. Your health care provider can help determine your BMI and help you achieve or maintain a healthy weight.  For females 67 years of age and older: ? A BMI below 18.5 is considered underweight. ? A BMI of 18.5 to 24.9 is normal. ? A BMI of 25 to 29.9 is considered overweight. ? A BMI of 30 and above is considered  obese.  Watch levels of cholesterol and blood lipids  You should start having your blood tested for lipids and cholesterol at 82 years of age, then have this test every 5 years.  You may need to have your cholesterol levels checked more often if: ? Your lipid or cholesterol levels are high. ? You are older than 82 years of age. ? You are at high risk for heart disease.  Cancer screening Lung Cancer  Lung cancer screening is recommended for adults 45-23 years old who are at high risk for lung cancer because of a history of smoking.  A yearly low-dose CT scan of the lungs is recommended for people who: ? Currently smoke. ? Have quit within the past 15 years. ? Have at least a 30-pack-year history of smoking. A pack year is smoking an average of one pack of cigarettes a day for 1 year.  Yearly screening should continue until it has been 15 years since you quit.  Yearly screening should stop if you develop a health problem that would prevent you from having lung cancer treatment.  Breast Cancer  Practice breast self-awareness. This means understanding how your breasts normally appear and feel.  It also means doing regular breast self-exams. Let your health care provider know about any changes, no matter how small.  If you are in your 20s or 30s, you should have a clinical breast exam (CBE) by a health care provider every 1-3 years as part of a regular health exam.  If you are 40  or older, have a CBE every year. Also consider having a breast X-ray (mammogram) every year.  If you have a family history of breast cancer, talk to your health care provider about genetic screening.  If you are at high risk for breast cancer, talk to your health care provider about having an MRI and a mammogram every year.  Breast cancer gene (BRCA) assessment is recommended for women who have family members with BRCA-related cancers. BRCA-related cancers  include: ? Breast. ? Ovarian. ? Tubal. ? Peritoneal cancers.  Results of the assessment will determine the need for genetic counseling and BRCA1 and BRCA2 testing.  Cervical Cancer Your health care provider may recommend that you be screened regularly for cancer of the pelvic organs (ovaries, uterus, and vagina). This screening involves a pelvic examination, including checking for microscopic changes to the surface of your cervix (Pap test). You may be encouraged to have this screening done every 3 years, beginning at age 21.  For women ages 30-65, health care providers may recommend pelvic exams and Pap testing every 3 years, or they may recommend the Pap and pelvic exam, combined with testing for human papilloma virus (HPV), every 5 years. Some types of HPV increase your risk of cervical cancer. Testing for HPV may also be done on women of any age with unclear Pap test results.  Other health care providers may not recommend any screening for nonpregnant women who are considered low risk for pelvic cancer and who do not have symptoms. Ask your health care provider if a screening pelvic exam is right for you.  If you have had past treatment for cervical cancer or a condition that could lead to cancer, you need Pap tests and screening for cancer for at least 20 years after your treatment. If Pap tests have been discontinued, your risk factors (such as having a new sexual partner) need to be reassessed to determine if screening should resume. Some women have medical problems that increase the chance of getting cervical cancer. In these cases, your health care provider may recommend more frequent screening and Pap tests.  Colorectal Cancer  This type of cancer can be detected and often prevented.  Routine colorectal cancer screening usually begins at 82 years of age and continues through 82 years of age.  Your health care provider may recommend screening at an earlier age if you have risk factors  for colon cancer.  Your health care provider may also recommend using home test kits to check for hidden blood in the stool.  A small camera at the end of a tube can be used to examine your colon directly (sigmoidoscopy or colonoscopy). This is done to check for the earliest forms of colorectal cancer.  Routine screening usually begins at age 50.  Direct examination of the colon should be repeated every 5-10 years through 82 years of age. However, you may need to be screened more often if early forms of precancerous polyps or small growths are found.  Skin Cancer  Check your skin from head to toe regularly.  Tell your health care provider about any new moles or changes in moles, especially if there is a change in a mole's shape or color.  Also tell your health care provider if you have a mole that is larger than the size of a pencil eraser.  Always use sunscreen. Apply sunscreen liberally and repeatedly throughout the day.  Protect yourself by wearing long sleeves, pants, a wide-brimmed hat, and sunglasses whenever you are   outside.  Heart disease, diabetes, and high blood pressure  High blood pressure causes heart disease and increases the risk of stroke. High blood pressure is more likely to develop in: ? People who have blood pressure in the high end of the normal range (130-139/85-89 mm Hg). ? People who are overweight or obese. ? People who are African American.  If you are 87-36 years of age, have your blood pressure checked every 3-5 years. If you are 52 years of age or older, have your blood pressure checked every year. You should have your blood pressure measured twice-once when you are at a hospital or clinic, and once when you are not at a hospital or clinic. Record the average of the two measurements. To check your blood pressure when you are not at a hospital or clinic, you can use: ? An automated blood pressure machine at a pharmacy. ? A home blood pressure monitor.  If  you are between 28 years and 36 years old, ask your health care provider if you should take aspirin to prevent strokes.  Have regular diabetes screenings. This involves taking a blood sample to check your fasting blood sugar level. ? If you are at a normal weight and have a low risk for diabetes, have this test once every three years after 82 years of age. ? If you are overweight and have a high risk for diabetes, consider being tested at a younger age or more often. Preventing infection Hepatitis B  If you have a higher risk for hepatitis B, you should be screened for this virus. You are considered at high risk for hepatitis B if: ? You were born in a country where hepatitis B is common. Ask your health care provider which countries are considered high risk. ? Your parents were born in a high-risk country, and you have not been immunized against hepatitis B (hepatitis B vaccine). ? You have HIV or AIDS. ? You use needles to inject street drugs. ? You live with someone who has hepatitis B. ? You have had sex with someone who has hepatitis B. ? You get hemodialysis treatment. ? You take certain medicines for conditions, including cancer, organ transplantation, and autoimmune conditions.  Hepatitis C  Blood testing is recommended for: ? Everyone born from [birth year] through 1963. ? Anyone with known risk factors for hepatitis C.  Sexually transmitted infections (STIs)  You should be screened for sexually transmitted infections (STIs) including gonorrhea and chlamydia if: ? You are sexually active and are younger than 82 years of age. ? You are older than 82 years of age and your health care provider tells you that you are at risk for this type of infection. ? Your sexual activity has changed since you were last screened and you are at an increased risk for chlamydia or gonorrhea. Ask your health care provider if you are at risk.  If you do not have HIV, but are at risk, it may be recommended  that you take a prescription medicine daily to prevent HIV infection. This is called pre-exposure prophylaxis (PrEP). You are considered at risk if: ? You are sexually active and do not regularly use condoms or know the HIV status of your partner(s). ? You take drugs by injection. ? You are sexually active with a partner who has HIV.  Talk with your health care provider about whether you are at high risk of being infected with HIV. If you choose to begin PrEP, you should first be tested  for HIV. You should then be tested every 3 months for as long as you are taking PrEP. Pregnancy  If you are premenopausal and you may become pregnant, ask your health care provider about preconception counseling.  If you may become pregnant, take 400 to 800 micrograms (mcg) of folic acid every day.  If you want to prevent pregnancy, talk to your health care provider about birth control (contraception). Osteoporosis and menopause  Osteoporosis is a disease in which the bones lose minerals and strength with aging. This can result in serious bone fractures. Your risk for osteoporosis can be identified using a bone density scan.  If you are 45 years of age or older, or if you are at risk for osteoporosis and fractures, ask your health care provider if you should be screened.  Ask your health care provider whether you should take a calcium or vitamin D supplement to lower your risk for osteoporosis.  Menopause may have certain physical symptoms and risks.  Hormone replacement therapy may reduce some of these symptoms and risks. Talk to your health care provider about whether hormone replacement therapy is right for you. Follow these instructions at home:  Schedule regular health, dental, and eye exams.  Stay current with your immunizations.  Do not use any tobacco products including cigarettes, chewing tobacco, or electronic cigarettes.  If you are pregnant, do not drink alcohol.  If you are  breastfeeding, limit how much and how often you drink alcohol.  Limit alcohol intake to no more than 1 drink per day for nonpregnant women. One drink equals 12 ounces of beer, 5 ounces of wine, or 1 ounces of hard liquor.  Do not use street drugs.  Do not share needles.  Ask your health care provider for help if you need support or information about quitting drugs.  Tell your health care provider if you often feel depressed.  Tell your health care provider if you have ever been abused or do not feel safe at home. This information is not intended to replace advice given to you by your health care provider. Make sure you discuss any questions you have with your health care provider. Document Released: 02/28/2011 Document Revised: 01/21/2016 Document Reviewed: 05/19/2015 Elsevier Interactive Patient Education  [employer].

## 2017-11-22 NOTE — Patient Instructions (Addendum)
Follow up in 1 year for your physical- or as needed We'll notify you of your lab results and make any changes if needed Continue to eat regularly- protein shakes could help put some weight back on! Call with any questions or concerns [place]!!!

## 2017-11-22 NOTE — Progress Notes (Signed)
   Subjective:    Patient ID: Yvonne [name], female    DOB: 04-20-1926, 84 y.o.   MRN: [id number]  HPI CPE- UTD on immunizations.  She has aged out of colonoscopy.  She declines mammogram.  Declines bone density.  Elevated BP- pt is following w/ Dr C and will be wearing home BP monitor.  He is working on adjusting her BP meds.  Malnutrition- pt reports she is eating regularly but no longer drinking protein shakes.   Review of Systems Patient reports no vision/ hearing changes, adenopathy,fever, weight change,  persistant/recurrent hoarseness , swallowing issues, chest pain, palpitations, edema, persistant/recurrent cough, hemoptysis, dyspnea (rest/exertional/paroxysmal nocturnal), gastrointestinal bleeding (melena, rectal bleeding), significant heartburn, bowel changes, GU symptoms (dysuria, hematuria, incontinence), Gyn symptoms (abnormal  bleeding, pain),  syncope, focal weakness, memory loss, numbness & tingling, skin/hair/nail changes, abnormal bruising or bleeding, anxiety, or depression.   + RUQ abdominal pain- intermittent, brief 'stabbing pain' for months.  Pt has hx of gallstones    Objective:   Physical Exam General Appearance:    Alert, cooperative, no distress, appears stated age, thin, frail  Head:    Normocephalic, without obvious abnormality, atraumatic  Eyes:    PERRL, conjunctiva/corneas clear, EOM's intact, fundi    benign, both eyes  Ears:    Normal TM's and external ear canals, both ears  Nose:   Nares normal, septum midline, mucosa normal, no drainage    or sinus tenderness  Throat:   Lips, mucosa, and tongue normal; teeth and gums normal  Neck:   Supple, symmetrical, trachea midline, no adenopathy;    Thyroid: no enlargement/tenderness/nodules  Back:     Symmetric, no curvature, ROM normal, no CVA tenderness  Lungs:     Clear to auscultation bilaterally, respirations unlabored  Chest Wall:    No tenderness or deformity   Heart:    Regular rate and rhythm, S1  and S2 normal, no murmur, rub   or gallop  Breast Exam:    Deferred  Abdomen:     Soft, non-tender, bowel sounds active all four quadrants,    no masses, no organomegaly  Genitalia:    Deferred  Rectal:    Extremities:   Extremities normal, atraumatic, no cyanosis or edema  Pulses:   2+ and symmetric all extremities  Skin:   Skin color, texture, turgor normal, no rashes or lesions  Lymph nodes:   Cervical, supraclavicular, and axillary nodes normal  Neurologic:   CNII-XII intact, normal strength, sensation and reflexes    throughout          Assessment & Plan:

## 2017-11-22 NOTE — Assessment & Plan Note (Signed)
Ongoing issue for pt.  Stressed need to eat regularly and add protein shakes.  Check labs.  Will continue to follow.

## 2017-11-22 NOTE — Assessment & Plan Note (Signed)
Ongoing issue.  Pt declines DEXA.  Refusing tx.  Check Vit D and replete prn.

## 2017-11-23 ENCOUNTER — Encounter: Payer: Self-pay | Admitting: General Practice

## 2017-11-24 ENCOUNTER — Ambulatory Visit: Payer: Medicare Other

## 2017-11-24 DIAGNOSIS — I10 Essential (primary) hypertension: Secondary | ICD-10-CM

## 2017-12-03 ENCOUNTER — Inpatient Hospital Stay (HOSPITAL_COMMUNITY)
Admission: EM | Admit: 2017-12-03 | Discharge: 2017-12-27 | DRG: 082 | Disposition: E | Payer: Medicare Other | Attending: General Surgery | Admitting: General Surgery

## 2017-12-03 ENCOUNTER — Other Ambulatory Visit: Payer: Self-pay

## 2017-12-03 ENCOUNTER — Emergency Department (HOSPITAL_COMMUNITY): Payer: Medicare Other

## 2017-12-03 ENCOUNTER — Encounter (HOSPITAL_COMMUNITY): Payer: Self-pay | Admitting: *Deleted

## 2017-12-03 DIAGNOSIS — R402313 Coma scale, best motor response, none, at hospital admission: Secondary | ICD-10-CM | POA: Diagnosis present

## 2017-12-03 DIAGNOSIS — E039 Hypothyroidism, unspecified: Secondary | ICD-10-CM | POA: Diagnosis present

## 2017-12-03 DIAGNOSIS — Z7982 Long term (current) use of aspirin: Secondary | ICD-10-CM

## 2017-12-03 DIAGNOSIS — Z66 Do not resuscitate: Secondary | ICD-10-CM | POA: Diagnosis present

## 2017-12-03 DIAGNOSIS — R402213 Coma scale, best verbal response, none, at hospital admission: Secondary | ICD-10-CM | POA: Diagnosis present

## 2017-12-03 DIAGNOSIS — Z515 Encounter for palliative care: Secondary | ICD-10-CM | POA: Diagnosis present

## 2017-12-03 DIAGNOSIS — M79641 Pain in right hand: Secondary | ICD-10-CM

## 2017-12-03 DIAGNOSIS — Z85828 Personal history of other malignant neoplasm of skin: Secondary | ICD-10-CM

## 2017-12-03 DIAGNOSIS — K219 Gastro-esophageal reflux disease without esophagitis: Secondary | ICD-10-CM | POA: Diagnosis present

## 2017-12-03 DIAGNOSIS — Y939 Activity, unspecified: Secondary | ICD-10-CM

## 2017-12-03 DIAGNOSIS — S065X9A Traumatic subdural hemorrhage with loss of consciousness of unspecified duration, initial encounter: Principal | ICD-10-CM | POA: Diagnosis present

## 2017-12-03 DIAGNOSIS — Z8249 Family history of ischemic heart disease and other diseases of the circulatory system: Secondary | ICD-10-CM

## 2017-12-03 DIAGNOSIS — Z96641 Presence of right artificial hip joint: Secondary | ICD-10-CM | POA: Diagnosis present

## 2017-12-03 DIAGNOSIS — S065XAA Traumatic subdural hemorrhage with loss of consciousness status unknown, initial encounter: Secondary | ICD-10-CM

## 2017-12-03 DIAGNOSIS — M810 Age-related osteoporosis without current pathological fracture: Secondary | ICD-10-CM | POA: Diagnosis present

## 2017-12-03 DIAGNOSIS — R402113 Coma scale, eyes open, never, at hospital admission: Secondary | ICD-10-CM | POA: Diagnosis present

## 2017-12-03 DIAGNOSIS — W1830XA Fall on same level, unspecified, initial encounter: Secondary | ICD-10-CM | POA: Diagnosis present

## 2017-12-03 DIAGNOSIS — Y92000 Kitchen of unspecified non-institutional (private) residence as  the place of occurrence of the external cause: Secondary | ICD-10-CM

## 2017-12-03 DIAGNOSIS — J9601 Acute respiratory failure with hypoxia: Secondary | ICD-10-CM | POA: Diagnosis present

## 2017-12-03 DIAGNOSIS — I10 Essential (primary) hypertension: Secondary | ICD-10-CM | POA: Diagnosis present

## 2017-12-03 HISTORY — DX: Atherosclerotic heart disease of native coronary artery without angina pectoris: I25.10

## 2017-12-03 LAB — COMPREHENSIVE METABOLIC PANEL
ALT: 15 U/L (ref 14–54)
ANION GAP: 12 (ref 5–15)
AST: 37 U/L (ref 15–41)
Albumin: 3.8 g/dL (ref 3.5–5.0)
Alkaline Phosphatase: 79 U/L (ref 38–126)
BILIRUBIN TOTAL: 1.1 mg/dL (ref 0.3–1.2)
BUN: 26 mg/dL — ABNORMAL HIGH (ref 6–20)
CHLORIDE: 105 mmol/L (ref 101–111)
CO2: 21 mmol/L — ABNORMAL LOW (ref 22–32)
Calcium: 9.1 mg/dL (ref 8.9–10.3)
Creatinine, Ser: 1.19 mg/dL — ABNORMAL HIGH (ref 0.44–1.00)
GFR calc Af Amer: 46 mL/min — ABNORMAL LOW (ref >60)
GFR, EST NON AFRICAN AMERICAN: 39 mL/min — AB (ref >60)
Glucose, Bld: 200 mg/dL — ABNORMAL HIGH (ref 65–99)
POTASSIUM: 3.9 mmol/L (ref 3.5–5.1)
Sodium: 138 mmol/L (ref 135–145)
TOTAL PROTEIN: 6.9 g/dL (ref 6.5–8.1)

## 2017-12-03 LAB — I-STAT CHEM 8, ED
BUN: 25 mg/dL — ABNORMAL HIGH (ref 6–20)
CALCIUM ION: 1.09 mmol/L — AB (ref 1.15–1.40)
CHLORIDE: 108 mmol/L (ref 101–111)
Creatinine, Ser: 1.1 mg/dL — ABNORMAL HIGH (ref 0.44–1.00)
GLUCOSE: 198 mg/dL — AB (ref 65–99)
HCT: 39 % (ref 36.0–46.0)
HEMOGLOBIN: 13.3 g/dL (ref 12.0–15.0)
POTASSIUM: 3.9 mmol/L (ref 3.5–5.1)
Sodium: 140 mmol/L (ref 135–145)
TCO2: 22 mmol/L (ref 22–32)

## 2017-12-03 LAB — CBC
HEMATOCRIT: 40.1 % (ref 36.0–46.0)
HEMOGLOBIN: 12.7 g/dL (ref 12.0–15.0)
MCH: 30.2 pg (ref 26.0–34.0)
MCHC: 31.7 g/dL (ref 30.0–36.0)
MCV: 95.2 fL (ref 78.0–100.0)
Platelets: 154 10*3/uL (ref 150–400)
RBC: 4.21 MIL/uL (ref 3.87–5.11)
RDW: 14.7 % (ref 11.5–15.5)
WBC: 10.4 10*3/uL (ref 4.0–10.5)

## 2017-12-03 LAB — I-STAT CG4 LACTIC ACID, ED: LACTIC ACID, VENOUS: 2.12 mmol/L — AB (ref 0.5–1.9)

## 2017-12-03 LAB — ETHANOL: Alcohol, Ethyl (B): <10 mg/dL (ref <10)

## 2017-12-03 LAB — PROTIME-INR
INR: 0.99
PROTHROMBIN TIME: 13 s (ref 11.4–15.2)

## 2017-12-03 LAB — I-STAT TROPONIN, ED: TROPONIN I, POC: 0 ng/mL (ref 0.00–0.08)

## 2017-12-03 MED ORDER — PROPOFOL 1000 MG/100ML IV EMUL
INTRAVENOUS | Status: AC
  Filled 2017-12-03: qty 100

## 2017-12-03 MED ORDER — FENTANYL CITRATE (PF) 100 MCG/2ML IJ SOLN
INTRAMUSCULAR | Status: AC
  Filled 2017-12-03: qty 2

## 2017-12-03 NOTE — ED Notes (Signed)
Etomidate 2o0 mg by karen newnam  fentabyl 50 mcg iv karen n rn  succ 100mg  kare n rn

## 2017-12-03 NOTE — ED Notes (Signed)
Preparing to intubate the pt

## 2017-12-03 NOTE — ED Notes (Signed)
The  Pt arrived wearing only panties

## 2017-12-03 NOTE — Progress Notes (Signed)
Pt transported to and from CT without event.  

## 2017-12-03 NOTE — ED Notes (Signed)
Dr.nudleman at the bedside

## 2017-12-03 NOTE — ED Notes (Signed)
Pt wearing only panties when she arrived from home  False  Teeth given to family

## 2017-12-03 NOTE — ED Notes (Signed)
Iv rt wrist by gems

## 2017-12-03 NOTE — ED Notes (Signed)
Heart rate slowing and speeding intermittntly

## 2017-12-03 NOTE — ED Notes (Signed)
nss wide open

## 2017-12-03 NOTE — ED Triage Notes (Signed)
See notes in the trauma record  Minimal movement only her rt leg  No sedation at present

## 2017-12-03 NOTE — ED Provider Notes (Signed)
[facility] EMERGENCY DEPARTMENT Provider Note   CSN: [id number] Arrival date & time: 12/19/2017  2228     History   Chief Complaint Chief Complaint  Patient presents with  . unresponsive    HPI Yvonne [name] is a 84 y.o. female.  HPI [place] in her kitchen.  At baseline she is independently active.  Family members found her and she was alert and appropriate.  Reportedly the patient walked back to her bedroom.  Approximately 45 minutes later family checked on her and found her to be very poorly responsive and having vomited.  On EMS arrival, there was emesis and the patient was extremely confused.  Patient takes a daily aspirin. Past Medical History:  Diagnosis Date  . Coronary artery disease     There are no active problems to display for this patient.      OB History   None      Home Medications    Prior to Admission medications   Not on File    Family History No family history on file.  Social History Social History   Tobacco Use  . Smoking status: Not on file  Substance Use Topics  . Alcohol use: Not on file  . Drug use: Not on file     Allergies   Patient has no allergy information on record.   Review of Systems Review of Systems  Level 5 caveat cannot obtain review of systems due to patient condition. Physical Exam Updated Vital Signs BP (!) 166/75   Pulse 84   Temp (!) 96.4 F (35.8 C)   Resp 19   Ht 6\' 7"  (2.007 m)   Wt 44.5 kg (98 lb)   SpO2 100%   BMI 11.04 kg/m   Physical Exam  Constitutional:  Patient arrives with c-collar in place.  She is confused and very poorly responsive.  Respirations are sonorous but steady.  HENT:  Hematoma to posterior right scalp.  She has upper dentures.  Small amount of dried blood at the lips but no bleeding intraorally or nasally.  Nasal trumpet in place.  Eyes:  Pupils asymmetric.  No purposeful eye movements.  Neck:  Cervical collar maintained.  Cardiovascular: Normal rate,  regular rhythm, normal heart sounds and intact distal pulses.  Pulmonary/Chest:  Sonorous but regular respirations.  Breath sounds coarse with transmitted upper airway noise.  Abdominal: Soft. She exhibits no distension.  Musculoskeletal:  Patient has abrasion fusion to posterior right shoulder.  She has hematoma formation on both knees.  Neurological:  Patient is obtunded and with intermittent decerebrate posturing  Skin: Skin is warm and dry. There is pallor.     ED Treatments / Results  Labs (all labs ordered are listed, but only abnormal results are displayed) Labs Reviewed  COMPREHENSIVE METABOLIC PANEL - Abnormal; Notable for the following components:      Result Value   CO2 21 (*)    Glucose, Bld 200 (*)    BUN 26 (*)    Creatinine, Ser 1.19 (*)    GFR calc non Af Amer 39 (*)    GFR calc Af Amer 46 (*)    All other components within normal limits  I-STAT CHEM 8, ED - Abnormal; Notable for the following components:   BUN 25 (*)    Creatinine, Ser 1.10 (*)    Glucose, Bld 198 (*)    Calcium, Ion 1.09 (*)    All other components within normal limits  I-STAT CG4 LACTIC ACID, ED -  Abnormal; Notable for the following components:   Lactic Acid, Venous 2.12 (*)    All other components within normal limits  I-STAT ARTERIAL BLOOD GAS, ED - Abnormal; Notable for the following components:   pH, Arterial 7.327 (*)    pO2, Arterial 208.0 (*)    Acid-base deficit 3.0 (*)    All other components within normal limits  CBC  ETHANOL  PROTIME-INR  CDS SEROLOGY  URINALYSIS, ROUTINE W REFLEX MICROSCOPIC  LACTIC ACID, PLASMA  I-STAT TROPONIN, ED  TYPE AND SCREEN  PREPARE FRESH FROZEN PLASMA    EKG None  Radiology Ct Head Wo Contrast  Result Date: 12/15/2017 CLINICAL DATA:  Level 1 trauma. Fall. Patient was found unresponsive. EXAM: CT HEAD WITHOUT CONTRAST CT CERVICAL SPINE WITHOUT CONTRAST TECHNIQUE: Multidetector CT imaging of the head and cervical spine was performed  following the standard protocol without intravenous contrast. Multiplanar CT image reconstructions of the cervical spine were also generated. COMPARISON:  None. FINDINGS: CT HEAD FINDINGS Brain: There is a large right subdural hematoma extending from the frontotemporal to the parietal region and measuring about 1.9 cm in maximal depth. There is associated mass effect with effacement of the right lateral ventricle and right cerebral sulci. Right to left midline shift of about 1.5 cm. There is effacement of the right basal cisterns. Fourth ventricle is patent. Underlying cerebral atrophy and small vessel ischemic white matter changes. No intraparenchymal or intraventricular hemorrhage. In the inferior temporal lobe, there is some blood in the sulci which may indicate a small amount of subarachnoid hemorrhage. Vascular: Intracranial arterial vascular calcifications are present. Skull: The calvarium appears intact. No acute depressed skull fractures are demonstrated. Sinuses/Orbits: Paranasal sinuses and mastoid air cells are clear. Other: Subcutaneous scalp hematoma over the right temporal and posterior frontal region. CT CERVICAL SPINE FINDINGS Alignment: Normal alignment of the cervical vertebrae and posterior elements. C1-2 articulation appears intact. Skull base and vertebrae: Skull base appears intact. No vertebral compression deformities. No focal bone lesion or bone destruction. Bone cortex appears intact. Soft tissues and spinal canal: Evaluation of prevertebral soft tissues is limited due to presence of an endotracheal tube. No definite prevertebral soft tissue swelling. No paraspinal soft tissue mass or infiltration. Secretions are demonstrated in the nasopharynx and sphenoid sinus. Disc levels: Degenerative changes in the cervical spine with narrowed disc spaces and associated endplate hypertrophic changes extending from C3 through C7. Degenerative changes in the facet joints. Upper chest: Calcification,  pleural thickening, and bronchiectasis in the lung apices consistent with chronic postinflammatory change. Other: None. IMPRESSION: 1. Large right subdural hematoma with maximal depth of about 1.9 cm. Significant intracranial mass effect with 1.5 cm right to left midline shift, effacement of sulci and right lateral ventricle, and effacement of right basal cisterns. 2. No acute depressed skull fractures identified. 3. Subcutaneous scalp hematoma over the right temporal and posterior frontal region. 4. Normal alignment of the cervical spine. No acute displaced fractures identified. 5. Diffuse degenerative changes in the cervical spine. 6. Postinflammatory changes in the lung apices. 7. Endotracheal tube in place. Secretions are demonstrated in the nasopharynx and sphenoid sinus. 8. These results were discussed at the workstation prior to the time of interpretation on 12/13/2017 at 11:15 pm with Dr. Hulen Skains , who verbally acknowledged these results. Electronically Signed   By: Lucienne Capers M.D.   On: 12/13/2017 23:16   Ct Cervical Spine Wo Contrast  Result Date: 12/20/2017 CLINICAL DATA:  Level 1 trauma. Fall. Patient was found unresponsive. EXAM: CT  HEAD WITHOUT CONTRAST CT CERVICAL SPINE WITHOUT CONTRAST TECHNIQUE: Multidetector CT imaging of the head and cervical spine was performed following the standard protocol without intravenous contrast. Multiplanar CT image reconstructions of the cervical spine were also generated. COMPARISON:  None. FINDINGS: CT HEAD FINDINGS Brain: There is a large right subdural hematoma extending from the frontotemporal to the parietal region and measuring about 1.9 cm in maximal depth. There is associated mass effect with effacement of the right lateral ventricle and right cerebral sulci. Right to left midline shift of about 1.5 cm. There is effacement of the right basal cisterns. Fourth ventricle is patent. Underlying cerebral atrophy and small vessel ischemic white matter changes.  No intraparenchymal or intraventricular hemorrhage. In the inferior temporal lobe, there is some blood in the sulci which may indicate a small amount of subarachnoid hemorrhage. Vascular: Intracranial arterial vascular calcifications are present. Skull: The calvarium appears intact. No acute depressed skull fractures are demonstrated. Sinuses/Orbits: Paranasal sinuses and mastoid air cells are clear. Other: Subcutaneous scalp hematoma over the right temporal and posterior frontal region. CT CERVICAL SPINE FINDINGS Alignment: Normal alignment of the cervical vertebrae and posterior elements. C1-2 articulation appears intact. Skull base and vertebrae: Skull base appears intact. No vertebral compression deformities. No focal bone lesion or bone destruction. Bone cortex appears intact. Soft tissues and spinal canal: Evaluation of prevertebral soft tissues is limited due to presence of an endotracheal tube. No definite prevertebral soft tissue swelling. No paraspinal soft tissue mass or infiltration. Secretions are demonstrated in the nasopharynx and sphenoid sinus. Disc levels: Degenerative changes in the cervical spine with narrowed disc spaces and associated endplate hypertrophic changes extending from C3 through C7. Degenerative changes in the facet joints. Upper chest: Calcification, pleural thickening, and bronchiectasis in the lung apices consistent with chronic postinflammatory change. Other: None. IMPRESSION: 1. Large right subdural hematoma with maximal depth of about 1.9 cm. Significant intracranial mass effect with 1.5 cm right to left midline shift, effacement of sulci and right lateral ventricle, and effacement of right basal cisterns. 2. No acute depressed skull fractures identified. 3. Subcutaneous scalp hematoma over the right temporal and posterior frontal region. 4. Normal alignment of the cervical spine. No acute displaced fractures identified. 5. Diffuse degenerative changes in the cervical spine. 6.  Postinflammatory changes in the lung apices. 7. Endotracheal tube in place. Secretions are demonstrated in the nasopharynx and sphenoid sinus. 8. These results were discussed at the workstation prior to the time of interpretation on 12/04/2017 at 11:15 pm with Dr. Hulen Skains , who verbally acknowledged these results. Electronically Signed   By: Lucienne Capers M.D.   On: 12/13/2017 23:16   Dg Chest Port 1 View  Result Date: 12/18/2017 CLINICAL DATA:  84 y/o F; found unresponsive, blown right pupil with posturing, level 1 trauma. EXAM: PORTABLE CHEST 1 VIEW COMPARISON:  None. FINDINGS: Mild cardiomegaly. Aortic atherosclerosis with calcification. Enlarged central pulmonary vasculature may represent pulmonary artery hypertension. Endotracheal tube tip 5.5 cm above the carina. Median sternotomy wires are aligned and intact. No pleural effusion or pneumothorax. Multiple thoracic spine compression deformities post augmentation. IMPRESSION: Cardiomegaly. Aortic atherosclerosis. Endotracheal tube 5.5 cm above carina. No active disease. Electronically Signed   By: Kristine Garbe M.D.   On: 12/20/2017 23:17    Procedures Procedure Name: Intubation Date/Time: 11/30/2017 10:35 PM Performed by: Charlesetta Shanks, MD Pre-anesthesia Checklist: Emergency Drugs available, Suction available and Patient being monitored Oxygen Delivery Method: Simple face mask Preoxygenation: Pre-oxygenation with 100% oxygen Induction Type: Rapid sequence Ventilation:  Mask ventilation without difficulty Laryngoscope Size: Glidescope and 4 Grade View: Grade I Tube size: 7.5 mm Number of attempts: 1 Placement Confirmation: ETT inserted through vocal cords under direct vision,  Positive ETCO2,  CO2 detector and Breath sounds checked- equal and bilateral Dental Injury: Teeth and Oropharynx as per pre-operative assessment  Comments: Uncomplicated intubation under visualization with glide scope.      (including critical care  time)  Medications Ordered in ED Medications  fentaNYL (SUBLIMAZE) 100 MCG/2ML injection (has no administration in time range)     Initial Impression / Assessment and Plan / ED Course  I have reviewed the triage vital signs and the nursing notes.  Pertinent labs & imaging results that were available during my care of the patient were reviewed by me and considered in my medical decision making (see chart for details).     Consult: Patient was seen as a level 1 trauma with Dr. Hulen Skains at bedside.  He has consulted Dr. Sherwood Gambler for neurosurgical management.  Final Clinical Impressions(s) / ED Diagnoses   Final diagnoses:  Subdural hematoma, acute ([place])  Patient presents a lot and above with obtundation and decerebrate posturing.  Patient had history of head injury earlier in the evening.  She had asymmetric pupils consistent with intracranial bleed.  Patient was intubated for airway protection.  Chest x-ray reviewed by myself post intubation with good ET tube placement just below the level of the clavicles.  Both lungs inflated.  Patient's heart rate otherwise stable in sinus.  Pressure is mildly hypertensive.  Care assumed by trauma and neurosurgery.  ED Discharge Orders    None       Charlesetta Shanks, MD 12/04/17 0020

## 2017-12-03 NOTE — ED Notes (Signed)
Family at the bedside.

## 2017-12-03 NOTE — ED Notes (Signed)
The pt arrive by gems from home the pt fell  Got up walked to her  Room found unresponsive a few minutes later  She arrived with  A blown rt pupil  Posturing on arrival

## 2017-12-03 NOTE — H&P (Signed)
History   Yvonne [name] is an 84 y.o. female.   Chief Complaint: No chief complaint on file.   84 year old female, GLF in kitchen, not witnessed, but others close by.  Helped her to get up, then went to bed.  Awakened shortly afterwards with severe headache.  Vomited.  Mental status quickly declined.  911 called.  Trauma Mechanism of injury: fall Injury location: head/neck Injury location detail: head Incident location: kitchen Time since incident: 45 minutes Arrived directly from scene: yes   Fall:      Fall occurred: standing      Height of fall: 4 feet, ground level      Impact surface: hard floor      Point of impact: head      Entrapped after fall: no  Protective equipment:       None      Suspicion of alcohol use: no      Suspicion of drug use: no  EMS/PTA data:      Bystander interventions: none      Ambulatory at scene: no      Blood loss: none      Responsiveness: unresponsive      Loss of consciousness: yes      Amnesic to event: yes      Airway interventions: none      Reason for intubation: airway protection and respiratory support      Breathing interventions: oxygen      IV access: established      IO access: none      Fluids administered: normal saline      Cardiac interventions: none      Medications administered: none      Immobilization: C-collar      Airway condition since incident: worsening      Breathing condition since incident: worsening      Circulation condition since incident: stable      Mental status condition since incident: worsening      Disability condition since incident: worsening  Current symptoms:      Associated symptoms:            Reports loss of consciousness.   Relevant PMH:      Pharmacological risk factors:            Antiplatelet therapy.       Tetanus status: unknown      The patient has not been admitted to the hospital due to injury in the past year, and has not been treated and released from the ED due to  injury in the past year.   No past medical history on file.   No family history on file. Social History:  has no tobacco, alcohol, and drug history on file.  Allergies  Allergies not on file  Home Medications   (Not in a hospital admission)  Trauma Course   Results for orders placed or performed during the hospital encounter of 12/19/2017 (from the past 48 hour(s))  Type and screen     Status: None (Preliminary result)   Collection Time: 12/22/2017 10:26 PM  Result Value Ref Range   ABO/RH(D) PENDING    Antibody Screen PENDING    Sample Expiration 12/31/2017    Unit Number [id number]    Blood Component Type RED CELLS,LR    Unit division 00    Status of Unit ISSUED    Unit tag comment VERBAL ORDERS PER DR PFEIFFER    Transfusion Status  OK TO TRANSFUSE Performed at [facility], [place] [address], [place], [place] [address]    Crossmatch Result PENDING    Unit Number [id number]    Blood Component Type RED CELLS,LR    Unit division 00    Status of Unit ISSUED    Unit tag comment VERBAL ORDERS PER DR PFEIFFER    Transfusion Status OK TO TRANSFUSE    Crossmatch Result PENDING   Prepare fresh frozen plasma     Status: None (Preliminary result)   Collection Time: 12/22/2017 10:26 PM  Result Value Ref Range   Unit Number [id number]    Blood Component Type LIQ PLASMA    Unit division 00    Status of Unit ISSUED    Unit tag comment VERBAL ORDERS PER DR PFEIFFER    Transfusion Status OK TO TRANSFUSE    Unit Number [id number]    Blood Component Type LIQ PLASMA    Unit division 00    Status of Unit ISSUED    Unit tag comment VERBAL ORDERS PER DR PFEIFFER    Transfusion Status      OK TO TRANSFUSE Performed at [facility], [address] [place], [place] [address]   I-stat troponin, ED     Status: None   Collection Time: 12/19/2017 10:43 PM  Result Value Ref Range   Troponin i, poc 0.00 0.00 - 0.08 ng/mL   Comment 3            Comment: Due to the  release kinetics of cTnI, a negative result within the first hours of the onset of symptoms does not rule out myocardial infarction with certainty. If myocardial infarction is still suspected, repeat the test at appropriate intervals.   I-stat chem 8, ed     Status: Abnormal   Collection Time: 12/18/2017 10:45 PM  Result Value Ref Range   Sodium 140 135 - 145 mmol/L   Potassium 3.9 3.5 - 5.1 mmol/L   Chloride 108 101 - 111 mmol/L   BUN 25 (H) 6 - 20 mg/dL   Creatinine, Ser 1.10 (H) 0.44 - 1.00 mg/dL   Glucose, Bld 198 (H) 65 - 99 mg/dL   Calcium, Ion 1.09 (L) 1.15 - 1.40 mmol/L   TCO2 22 22 - 32 mmol/L   Hemoglobin 13.3 12.0 - 15.0 g/dL   HCT 39.0 36.0 - 46.0 %  I-Stat CG4 Lactic Acid, ED     Status: Abnormal   Collection Time: 12/02/2017 10:45 PM  Result Value Ref Range   Lactic Acid, Venous 2.12 (HH) 0.5 - 1.9 mmol/L   Comment NOTIFIED PHYSICIAN    No results found.  Review of Systems  Unable to perform ROS: Intubated  Neurological: Positive for loss of consciousness.    Blood pressure (!) 156/67, pulse 76, temperature (!) 96.4 F (35.8 C), resp. rate 20, SpO2 100 %. Physical Exam  Constitutional: She appears well-developed. She is intubated.  small  HENT:  Head:    Eyes: Right pupil is not reactive (6 mm and fixed). Left pupil is reactive (4 mm and sluggishly reactive).  No EOMs  Neck:  In C-collar  Cardiovascular: Normal rate, regular rhythm and normal heart sounds.  Respiratory: Breath sounds normal. She is intubated. No respiratory distress.    GI: Soft. Bowel sounds are normal.  Musculoskeletal:       Left knee: She exhibits swelling and ecchymosis. She exhibits no deformity and no laceration.       Right hand: She  exhibits tenderness.  Neurological: GCS eye subscore is 1. GCS verbal subscore is 1. GCS motor subscore is 1.  Withdrawal on the right, posturing on the left  Skin: Skin is warm and dry.     Assessment/Plan Ground level fall with large right  SDH with gyri/sulcus effacement on the right, right to left shift with compression of right lateral ventricle, effacement of basilar cistern, > 2.9cm bleed with.4 cm midline shift.  Dr. Sherwood Gambler is aware and on his way to see thepatient and consider craniotomy and evacuation of hematoma.  Family undecided.  She doe have an advanced directive Judeth Horn 12/15/2017, 11:14 PM   Procedures

## 2017-12-03 NOTE — ED Notes (Signed)
Pt moving rt leg only

## 2017-12-03 NOTE — ED Notes (Signed)
Pt to c-t 2255 returned from [place]

## 2017-12-03 NOTE — ED Notes (Signed)
Intubated by drf pheiffer

## 2017-12-03 NOTE — ED Notes (Signed)
The pt vomited approx 300 ml of clear looking liquid

## 2017-12-04 DIAGNOSIS — R402313 Coma scale, best motor response, none, at hospital admission: Secondary | ICD-10-CM | POA: Diagnosis present

## 2017-12-04 DIAGNOSIS — J9601 Acute respiratory failure with hypoxia: Secondary | ICD-10-CM | POA: Diagnosis present

## 2017-12-04 DIAGNOSIS — Y92000 Kitchen of unspecified non-institutional (private) residence as  the place of occurrence of the external cause: Secondary | ICD-10-CM | POA: Diagnosis not present

## 2017-12-04 DIAGNOSIS — Z515 Encounter for palliative care: Secondary | ICD-10-CM | POA: Diagnosis present

## 2017-12-04 DIAGNOSIS — W1830XA Fall on same level, unspecified, initial encounter: Secondary | ICD-10-CM | POA: Diagnosis present

## 2017-12-04 DIAGNOSIS — K219 Gastro-esophageal reflux disease without esophagitis: Secondary | ICD-10-CM | POA: Diagnosis present

## 2017-12-04 DIAGNOSIS — R402113 Coma scale, eyes open, never, at hospital admission: Secondary | ICD-10-CM | POA: Diagnosis present

## 2017-12-04 DIAGNOSIS — S065X9A Traumatic subdural hemorrhage with loss of consciousness of unspecified duration, initial encounter: Secondary | ICD-10-CM | POA: Diagnosis present

## 2017-12-04 DIAGNOSIS — S065XAA Traumatic subdural hemorrhage with loss of consciousness status unknown, initial encounter: Secondary | ICD-10-CM | POA: Diagnosis present

## 2017-12-04 DIAGNOSIS — E039 Hypothyroidism, unspecified: Secondary | ICD-10-CM | POA: Diagnosis present

## 2017-12-04 DIAGNOSIS — M810 Age-related osteoporosis without current pathological fracture: Secondary | ICD-10-CM | POA: Diagnosis present

## 2017-12-04 DIAGNOSIS — I10 Essential (primary) hypertension: Secondary | ICD-10-CM | POA: Diagnosis present

## 2017-12-04 DIAGNOSIS — Z7982 Long term (current) use of aspirin: Secondary | ICD-10-CM | POA: Diagnosis not present

## 2017-12-04 DIAGNOSIS — Z96641 Presence of right artificial hip joint: Secondary | ICD-10-CM | POA: Diagnosis present

## 2017-12-04 DIAGNOSIS — M79641 Pain in right hand: Secondary | ICD-10-CM | POA: Diagnosis present

## 2017-12-04 DIAGNOSIS — Y939 Activity, unspecified: Secondary | ICD-10-CM | POA: Diagnosis not present

## 2017-12-04 DIAGNOSIS — Z8249 Family history of ischemic heart disease and other diseases of the circulatory system: Secondary | ICD-10-CM | POA: Diagnosis not present

## 2017-12-04 DIAGNOSIS — R402213 Coma scale, best verbal response, none, at hospital admission: Secondary | ICD-10-CM | POA: Diagnosis present

## 2017-12-04 DIAGNOSIS — Z66 Do not resuscitate: Secondary | ICD-10-CM | POA: Diagnosis present

## 2017-12-04 DIAGNOSIS — Z85828 Personal history of other malignant neoplasm of skin: Secondary | ICD-10-CM | POA: Diagnosis not present

## 2017-12-04 LAB — I-STAT ARTERIAL BLOOD GAS, ED
Acid-base deficit: 3 mmol/L — ABNORMAL HIGH (ref 0.0–2.0)
Bicarbonate: 23 mmol/L (ref 20.0–28.0)
O2 SAT: 100 %
PCO2 ART: 44 mmHg (ref 32.0–48.0)
PO2 ART: 208 mmHg — AB (ref 83.0–108.0)
Patient temperature: 98.6
TCO2: 24 mmol/L (ref 22–32)
pH, Arterial: 7.327 — ABNORMAL LOW (ref 7.350–7.450)

## 2017-12-04 LAB — PREPARE FRESH FROZEN PLASMA
UNIT DIVISION: 0
Unit division: 0

## 2017-12-04 LAB — CDS SEROLOGY

## 2017-12-04 LAB — BPAM FFP
BLOOD PRODUCT EXPIRATION DATE: 201904122359
BLOOD PRODUCT EXPIRATION DATE: 201904132359
ISSUE DATE / TIME: 201904072230
ISSUE DATE / TIME: 201904072230
Unit Type and Rh: 6200
Unit Type and Rh: 6200

## 2017-12-04 LAB — BLOOD PRODUCT ORDER (VERBAL) VERIFICATION

## 2017-12-04 MED ORDER — MORPHINE BOLUS VIA INFUSION
5.0000 mg | INTRAVENOUS | Status: DC | PRN
  Administered 2017-12-04: 2 mg via INTRAVENOUS
  Filled 2017-12-04: qty 20

## 2017-12-04 MED ORDER — SODIUM CHLORIDE 0.9% FLUSH: 3.0000 mL | Freq: Two times a day (BID) | INTRAVENOUS | Status: DC

## 2017-12-04 MED ORDER — MORPHINE 100MG IN NS 100ML (1MG/ML) PREMIX INFUSION
10.0000 mg/h | INTRAVENOUS | Status: DC
  Administered 2017-12-04: 1 mg/h via INTRAVENOUS
  Administered 2017-12-05: 10 mg/h via INTRAVENOUS
  Filled 2017-12-04 (×2): qty 100

## 2017-12-04 MED ORDER — SODIUM CHLORIDE 0.9 % IV SOLN
10.0000 mg/h | INTRAVENOUS | Status: DC
  Filled 2017-12-04: qty 10

## 2017-12-04 MED ORDER — SODIUM CHLORIDE 0.9 % IV SOLN: 250.0000 mL | INTRAVENOUS | Status: DC | PRN

## 2017-12-04 MED ORDER — SODIUM CHLORIDE 0.9% FLUSH: 3.0000 mL | INTRAVENOUS | Status: DC | PRN

## 2017-12-04 MED ORDER — MIDAZOLAM BOLUS VIA INFUSION (WITHDRAWAL LIFE SUSTAINING TX)
5.0000 mg | INTRAVENOUS | Status: DC | PRN
  Filled 2017-12-04: qty 20

## 2017-12-04 NOTE — ED Notes (Signed)
Will be withdrawing care when the rest of the family arrives

## 2017-12-04 NOTE — ED Notes (Signed)
The family has decided on comfort care only per dr Rita Ohara

## 2017-12-04 NOTE — ED Notes (Signed)
The pt rt pupil has increased in size non reactive size 9  Lt pupil has also increased in size  7.0  Sluggish reaction  The pt continues to have posturing movement of her arms and rt leg

## 2017-12-04 NOTE — Consult Note (Signed)
Reason for Consult:  Acute right hemispheric subdural hematoma Referring Physician:  Dr. Judeth Horn, trauma surgical service  Yvonne [name] is an 84 y.o. female.  HPI: Patient had been at her daughter's home when she fell earlier this evening.  She subsequently developed increasingly severe headache, then vomited, then mental status declined and EMS was contacted and the patient was brought to the [facility] emergency room and evaluated by the emergency room staff and Dr. Judeth Horn from the trauma surgical service.  Patient was intubated following arrival to the emergency room, and is currently supported by mechanical ventilator.  Family including 2 daughters and his son are at the bedside.  CT of the head was obtained and revealed a large right hemispheric subdural hematoma with marked mass-effect, shift, and herniation.  Neurosurgical consultation was requested  Past Surgical History:  Procedure Laterality Date  . ABLATION    . APPENDECTOMY    . BREAST SURGERY Right    right biospy  . Classic atrioventricular nodal reentrant  10/20/2010  . COLONOSCOPY    . EUS N/A 02/18/2016   Procedure: UPPER ENDOSCOPIC ULTRASOUND (EUS) LINEAR;  Surgeon: Milus Banister, MD;  Location: [facility];  Service: Endoscopy;  Laterality: N/A;  . EYE SURGERY Bilateral 2003   cataract bilateral  . HERNIA REPAIR  1993   Right Inguinal  . KYPHOPLASTY  09/05/2012   Procedure: KYPHOPLASTY;  Surgeon: Winfield Cunas, MD;  Location: [place] [facility];  Service: Neurosurgery;  Laterality: N/A;  L1 kyphoplasty  . KYPHOPLASTY N/A 04/01/2014   Procedure: KYPHOPLASTY;  Surgeon: Ashok Pall, MD;  Location: [place] [facility];  Service: Neurosurgery;  Laterality: N/A;  Lumbar Four Kyphoplasty  . KYPHOPLASTY N/A 08/07/2015   Procedure: KYPHOPLASTY LUMBAR THREE;  Surgeon: Ashok Pall, MD;  Location: [place] [facility];  Service: Neurosurgery;  Laterality: N/A;  . KYPHOPLASTY N/A 12/09/2015   Procedure: Lumbar five,  KYPHOPLASTY;  Surgeon: Ashok Pall, MD;  Location: [place] [facility];  Service: Neurosurgery;  Laterality: N/A;  L5 kyphoplasty  . KYPHOPLASTY N/A 01/13/2016   Procedure: Thoracic seven KYPHOPLASTY;  Surgeon: Ashok Pall, MD;  Location: [place] [facility];  Service: Neurosurgery;  Laterality: N/A;  . KYPHOPLASTY N/A 01/22/2016   Procedure: THORACIC EIGHT KYPHOPLASTY;  Surgeon: Ashok Pall, MD;  Location: [place] [facility];  Service: Neurosurgery;  Laterality: N/A;  T8 KYPHOPLASTY  . L2 kyphoplasty using Kyphon system.  03/17/2009  . ORIF WRIST FRACTURE Left 08/17/2016   Procedure: OPEN REDUCTION INTERNAL FIXATION (ORIF) WRIST FRACTURE;  Surgeon: Roseanne Kaufman, MD;  Location: [place];  Service: Orthopedics;  Laterality: Left;  block done as well   . PERCUTANEOUS PINNING WRIST FRACTURE Bilateral 1995   bil  . Replacement of ascending aorta and hemiarch and resuspension of the aortic valve with circulatory cardiopulmonary bypass and circulatory arrest  06/25/2010   Dr Servando Snare  . Revision of left total hip replacement using some allograft  11/23/2010  . TOE OSTEOTOMY     Right 2nd   . TONSILLECTOMY    . TUBAL LIGATION  1966       Past Medical History:  Diagnosis Date  . Acute blood loss anemia 2011   WITH ANEURYSM ONLY  . Afib ([place])    -no   . Arthritis   . Cancer ([place]) LATE 1970'S   skin cancer - arm squamous  . Compression fracture of body of thoracic vertebra ([place])    T 5  . Compression fracture of L2 lumbar vertebra ([facility])   .  GERD (gastroesophageal reflux disease)    "mild"  . Heart murmur   . History of atrial paroxysmal tachycardia   . History of blood transfusion   . History of hypothyroidism   . Hypertension   . Hypothyroidism   . Mitral valve prolapse   . Moderate aortic insufficiency 05/25/2014  . Osteoporosis   . Pericardial tamponade 05/2010  . Pneumonia    many years ago  . Premature atrial contractions   . Syncope    VERY RARE  .  Thoracic aortic aneurysm ([place]) 05/2010   Ruptured ascending  . UTI (lower urinary tract infection)    CHRONIC  . Volume overload 2011  . Wrist fracture    left         Family History  Problem Relation Age of Onset  . Heart failure Mother   . COPD Mother     never smoker  . Stroke Father   . Hypertension Brother   . Hypertension Brother   . Hypertension Sister   . Hypertension Sister   . Stroke Sister   . Stomach cancer      nephew  . Breast cancer      neice   Social History        Social History  . Marital status: Widowed    Spouse name: N/A  . Number of children: N/A  . Years of education: N/A      Occupational History  . Not on file.       Social History Main Topics  . Smoking status: Never Smoker  . Smokeless tobacco: Never Used  . Alcohol use No  . Drug use: No  . Sexual activity: Not on file    Allergies: Allergies not on file  Medications: I have reviewed the patient's current medications.  ROS:  Patient is in a coma, and ROS is unobtainable.  Physical Examination: Elderly white female  Blood pressure (!) 166/75, pulse 84, temperature (!) 96.4 F (35.8 C), resp. rate 19, height '6\' 7"'  (2.007 m), weight 44.5 kg (98 lb), SpO2 100 %.  Neurological Examination: Not opening eyes to voice or pain. Left pupil 3 mm, round, nonreactive to light.  Right pupil 7 mm, round, nonreactive to light. No corneal reflex. Decerebrating bilaterally spontaneously and to pain.   Results for orders placed or performed during the hospital encounter of 12/19/2017 (from the past 48 hour(s))  Prepare fresh frozen plasma     Status: None (Preliminary result)   Collection Time: 12/21/2017 10:26 PM  Result Value Ref Range   Unit Number [id number]    Blood Component Type LIQ PLASMA    Unit division 00    Status of Unit ISSUED    Unit tag comment VERBAL ORDERS PER DR PFEIFFER    Transfusion Status OK TO TRANSFUSE    Unit Number  [id number]    Blood Component Type LIQ PLASMA    Unit division 00    Status of Unit ISSUED    Unit tag comment VERBAL ORDERS PER DR PFEIFFER    Transfusion Status      OK TO TRANSFUSE Performed at [facility], [address] [place], [place] [address]   Type and screen     Status: None (Preliminary result)   Collection Time: 11/27/2017 10:34 PM  Result Value Ref Range   ABO/RH(D) A POS    Antibody Screen POS    Sample Expiration      12/23/2017 Performed at [facility], [place]  [address] [place], [place] [address]    Unit Number [id number]    Blood Component Type RED CELLS,LR    Unit division 00    Status of Unit ISSUED    Unit tag comment VERBAL ORDERS PER DR PFEIFFER    Transfusion Status OK TO TRANSFUSE    Crossmatch Result PENDING    Unit Number [id number]    Blood Component Type RED CELLS,LR    Unit division 00    Status of Unit ISSUED    Unit tag comment VERBAL ORDERS PER DR PFEIFFER    Transfusion Status OK TO TRANSFUSE    Crossmatch Result PENDING   I-stat troponin, ED     Status: None   Collection Time: 12/22/2017 10:43 PM  Result Value Ref Range   Troponin i, poc 0.00 0.00 - 0.08 ng/mL   Comment 3            Comment: Due to the release kinetics of cTnI, a negative result within the first hours of the onset of symptoms does not rule out myocardial infarction with certainty. If myocardial infarction is still suspected, repeat the test at appropriate intervals.   I-stat chem 8, ed     Status: Abnormal   Collection Time: 12/24/2017 10:45 PM  Result Value Ref Range   Sodium 140 135 - 145 mmol/L   Potassium 3.9 3.5 - 5.1 mmol/L   Chloride 108 101 - 111 mmol/L   BUN 25 (H) 6 - 20 mg/dL   Creatinine, Ser 1.10 (H) 0.44 - 1.00 mg/dL   Glucose, Bld 198 (H) 65 - 99 mg/dL   Calcium, Ion 1.09 (L) 1.15 - 1.40 mmol/L   TCO2 22 22 - 32 mmol/L   Hemoglobin 13.3 12.0 - 15.0 g/dL   HCT 39.0 36.0 - 46.0 %  I-Stat CG4 Lactic Acid, ED     Status: Abnormal    Collection Time: 12/07/2017 10:45 PM  Result Value Ref Range   Lactic Acid, Venous 2.12 (HH) 0.5 - 1.9 mmol/L   Comment NOTIFIED PHYSICIAN   Comprehensive metabolic panel     Status: Abnormal   Collection Time: 12/04/2017 10:53 PM  Result Value Ref Range   Sodium 138 135 - 145 mmol/L   Potassium 3.9 3.5 - 5.1 mmol/L   Chloride 105 101 - 111 mmol/L   CO2 21 (L) 22 - 32 mmol/L   Glucose, Bld 200 (H) 65 - 99 mg/dL   BUN 26 (H) 6 - 20 mg/dL   Creatinine, Ser 1.19 (H) 0.44 - 1.00 mg/dL   Calcium 9.1 8.9 - 10.3 mg/dL   Total Protein 6.9 6.5 - 8.1 g/dL   Albumin 3.8 3.5 - 5.0 g/dL   AST 37 15 - 41 U/L   ALT 15 14 - 54 U/L   Alkaline Phosphatase 79 38 - 126 U/L   Total Bilirubin 1.1 0.3 - 1.2 mg/dL   GFR calc non Af Amer 39 (L) >60 mL/min   GFR calc Af Amer 46 (L) >60 mL/min    Comment: (NOTE) The eGFR has been calculated using the CKD EPI equation. This calculation has not been validated in all clinical situations. eGFR's persistently <60 mL/min signify possible Chronic Kidney Disease.    Anion gap 12 5 - 15    Comment: Performed at [facility] [address], [place], [place] [address]  CBC     Status: None   Collection Time: 12/25/2017 10:53 PM  Result Value Ref Range   WBC 10.4 4.0 -  10.5 K/uL   RBC 4.21 3.87 - 5.11 MIL/uL   Hemoglobin 12.7 12.0 - 15.0 g/dL   HCT 40.1 36.0 - 46.0 %   MCV 95.2 78.0 - 100.0 fL   MCH 30.2 26.0 - 34.0 pg   MCHC 31.7 30.0 - 36.0 g/dL   RDW 14.7 11.5 - 15.5 %   Platelets 154 150 - 400 K/uL    Comment: Performed at [facility], [place] [address] [place], [place] [address]  Ethanol     Status: None   Collection Time: 12/11/2017 10:53 PM  Result Value Ref Range   Alcohol, Ethyl (B) <10 <10 mg/dL    Comment:        LOWEST DETECTABLE LIMIT FOR SERUM ALCOHOL IS 10 mg/dL FOR MEDICAL PURPOSES ONLY Performed at [facility], [place] [address] [place], [place] [address]   Protime-INR     Status: None   Collection Time: 12/02/2017 10:53 PM   Result Value Ref Range   Prothrombin Time 13.0 11.4 - 15.2 seconds   INR 0.99     Comment: Performed at [facility] [address] [place], [place] [address]    Ct Head Wo Contrast  Result Date: 12/01/2017 CLINICAL DATA:  Level 1 trauma. Fall. Patient was found unresponsive. EXAM: CT HEAD WITHOUT CONTRAST CT CERVICAL SPINE WITHOUT CONTRAST TECHNIQUE: Multidetector CT imaging of the head and cervical spine was performed following the standard protocol without intravenous contrast. Multiplanar CT image reconstructions of the cervical spine were also generated. COMPARISON:  None. FINDINGS: CT HEAD FINDINGS Brain: There is a large right subdural hematoma extending from the frontotemporal to the parietal region and measuring about 1.9 cm in maximal depth. There is associated mass effect with effacement of the right lateral ventricle and right cerebral sulci. Right to left midline shift of about 1.5 cm. There is effacement of the right basal cisterns. Fourth ventricle is patent. Underlying cerebral atrophy and small vessel ischemic white matter changes. No intraparenchymal or intraventricular hemorrhage. In the inferior temporal lobe, there is some blood in the sulci which may indicate a small amount of subarachnoid hemorrhage. Vascular: Intracranial arterial vascular calcifications are present. Skull: The calvarium appears intact. No acute depressed skull fractures are demonstrated. Sinuses/Orbits: Paranasal sinuses and mastoid air cells are clear. Other: Subcutaneous scalp hematoma over the right temporal and posterior frontal region. CT CERVICAL SPINE FINDINGS Alignment: Normal alignment of the cervical vertebrae and posterior elements. C1-2 articulation appears intact. Skull base and vertebrae: Skull base appears intact. No vertebral compression deformities. No focal bone lesion or bone destruction. Bone cortex appears intact. Soft tissues and spinal canal: Evaluation of prevertebral soft tissues is  limited due to presence of an endotracheal tube. No definite prevertebral soft tissue swelling. No paraspinal soft tissue mass or infiltration. Secretions are demonstrated in the nasopharynx and sphenoid sinus. Disc levels: Degenerative changes in the cervical spine with narrowed disc spaces and associated endplate hypertrophic changes extending from C3 through C7. Degenerative changes in the facet joints. Upper chest: Calcification, pleural thickening, and bronchiectasis in the lung apices consistent with chronic postinflammatory change. Other: None. IMPRESSION: 1. Large right subdural hematoma with maximal depth of about 1.9 cm. Significant intracranial mass effect with 1.5 cm right to left midline shift, effacement of sulci and right lateral ventricle, and effacement of right basal cisterns. 2. No acute depressed skull fractures identified. 3. Subcutaneous scalp hematoma over the right temporal and posterior frontal region. 4. Normal alignment of the cervical spine.  No acute displaced fractures identified. 5. Diffuse degenerative changes in the cervical spine. 6. Postinflammatory changes in the lung apices. 7. Endotracheal tube in place. Secretions are demonstrated in the nasopharynx and sphenoid sinus. 8. These results were discussed at the workstation prior to the time of interpretation on 12/13/2017 at 11:15 pm with Dr. Hulen Skains , who verbally acknowledged these results. Electronically Signed   By: Lucienne Capers M.D.   On: 11/28/2017 23:16   Ct Cervical Spine Wo Contrast  Result Date: 11/30/2017 CLINICAL DATA:  Level 1 trauma. Fall. Patient was found unresponsive. EXAM: CT HEAD WITHOUT CONTRAST CT CERVICAL SPINE WITHOUT CONTRAST TECHNIQUE: Multidetector CT imaging of the head and cervical spine was performed following the standard protocol without intravenous contrast. Multiplanar CT image reconstructions of the cervical spine were also generated. COMPARISON:  None. FINDINGS: CT HEAD FINDINGS Brain: There is  a large right subdural hematoma extending from the frontotemporal to the parietal region and measuring about 1.9 cm in maximal depth. There is associated mass effect with effacement of the right lateral ventricle and right cerebral sulci. Right to left midline shift of about 1.5 cm. There is effacement of the right basal cisterns. Fourth ventricle is patent. Underlying cerebral atrophy and small vessel ischemic white matter changes. No intraparenchymal or intraventricular hemorrhage. In the inferior temporal lobe, there is some blood in the sulci which may indicate a small amount of subarachnoid hemorrhage. Vascular: Intracranial arterial vascular calcifications are present. Skull: The calvarium appears intact. No acute depressed skull fractures are demonstrated. Sinuses/Orbits: Paranasal sinuses and mastoid air cells are clear. Other: Subcutaneous scalp hematoma over the right temporal and posterior frontal region. CT CERVICAL SPINE FINDINGS Alignment: Normal alignment of the cervical vertebrae and posterior elements. C1-2 articulation appears intact. Skull base and vertebrae: Skull base appears intact. No vertebral compression deformities. No focal bone lesion or bone destruction. Bone cortex appears intact. Soft tissues and spinal canal: Evaluation of prevertebral soft tissues is limited due to presence of an endotracheal tube. No definite prevertebral soft tissue swelling. No paraspinal soft tissue mass or infiltration. Secretions are demonstrated in the nasopharynx and sphenoid sinus. Disc levels: Degenerative changes in the cervical spine with narrowed disc spaces and associated endplate hypertrophic changes extending from C3 through C7. Degenerative changes in the facet joints. Upper chest: Calcification, pleural thickening, and bronchiectasis in the lung apices consistent with chronic postinflammatory change. Other: None. IMPRESSION: 1. Large right subdural hematoma with maximal depth of about 1.9 cm.  Significant intracranial mass effect with 1.5 cm right to left midline shift, effacement of sulci and right lateral ventricle, and effacement of right basal cisterns. 2. No acute depressed skull fractures identified. 3. Subcutaneous scalp hematoma over the right temporal and posterior frontal region. 4. Normal alignment of the cervical spine. No acute displaced fractures identified. 5. Diffuse degenerative changes in the cervical spine. 6. Postinflammatory changes in the lung apices. 7. Endotracheal tube in place. Secretions are demonstrated in the nasopharynx and sphenoid sinus. 8. These results were discussed at the workstation prior to the time of interpretation on 12/23/2017 at 11:15 pm with Dr. Hulen Skains , who verbally acknowledged these results. Electronically Signed   By: Lucienne Capers M.D.   On: 12/08/2017 23:16   Dg Chest Port 1 View  Result Date: 12/02/2017 CLINICAL DATA:  84 y/o F; found unresponsive, blown right pupil with posturing, level 1 trauma. EXAM: PORTABLE CHEST 1 VIEW COMPARISON:  None. FINDINGS: Mild cardiomegaly. Aortic atherosclerosis with calcification. Enlarged central pulmonary vasculature may represent  pulmonary artery hypertension. Endotracheal tube tip 5.5 cm above the carina. Median sternotomy wires are aligned and intact. No pleural effusion or pneumothorax. Multiple thoracic spine compression deformities post augmentation. IMPRESSION: Cardiomegaly. Aortic atherosclerosis. Endotracheal tube 5.5 cm above carina. No active disease. Electronically Signed   By: Kristine Garbe M.D.   On: 12/19/2017 23:17     Assessment/Plan: Patient with a devastating intracranial hemorrhage with Glasgow Coma Scale of 4/15.  Prognosis for survival, better yet neurologic recovery extremely limited.  Dr. Hulen Skains and I spoke with the patient's family including her 2 daughters and her son.  We discussed discussed the nature of her injury and the poor prognosis for survival, better yet neurologic  recovery.  Their questions were answered for them.  After allowing them time to discuss the situation together, they have asked that we not proceed with heroic measures, but rather requested comfort care.  Dr. Hulen Skains and myself feel the they have made the right decision, and will be able to support there wishes.  Hosie Spangle, MD 12/04/2017, 12:03 AM

## 2017-12-04 NOTE — Progress Notes (Signed)
Follow up - Trauma Critical Care  Patient Details:    Yvonne [name] is an 84 y.o. female.  Lines/tubes : Airway 7.5 mm (Active)  Secured at (cm) 22 cm 12/04/2017  4:21 AM  Measured From Lips 12/04/2017  4:21 AM  Secured Location Left 12/04/2017  4:21 AM  Secured By [employer] 12/04/2017  4:21 AM  Tube Holder Repositioned Yes 12/04/2017  4:21 AM  Site Condition Dry 12/04/2017  4:21 AM    Microbiology/Sepsis markers: No results found for this or any previous visit.  Consults:  Neurosurgery  Subjective:    Overnight Issues:  Morphine drip Objective:  Vital signs for last 24 hours: Temp:  [95.8 F (35.4 C)-96.4 F (35.8 C)] 96.4 F (35.8 C) (04/07 2313) Pulse Rate:  [35-115] 51 (04/08 0700) Resp:  [18-28] 18 (04/08 0700) BP: (119-190)/(52-87) 174/87 (04/08 0030) SpO2:  [97 %-100 %] 98 % (04/08 0700) FiO2 (%):  [40 %-50 %] 40 % (04/08 0421) Weight:  [44.5 kg (98 lb)-56.9 kg (125 lb 7.1 oz)] 56.9 kg (125 lb 7.1 oz) (04/08 0100)  Hemodynamic parameters for last 24 hours:    Intake/Output from previous day: 04/07 0701 - 04/08 0700 In: 1000 [I.V.:1000] Out: 300 [Emesis/NG output:300]  Intake/Output this shift: No intake/output data recorded.  Vent settings for last 24 hours: Vent Mode: PRVC FiO2 (%):  [40 %-50 %] 40 % Set Rate:  [18 bmp] 18 bmp Vt Set:  [440 mL] 440 mL PEEP:  [5 cmH20] 5 cmH20 Plateau Pressure:  [15 cmH20-19 cmH20] 19 cmH20  Physical Exam:  General: on vent Neuro: R pupil dilated and NR, L pupil 52mm slug, WD to pain HEENT/Neck: ETT Resp: clear to auscultation bilaterally CVS: RRR GI: soft, NT, ND Extremities: no edema  Results for orders placed or performed during the hospital encounter of 12/12/2017 (from the past 24 hour(s))  Prepare fresh frozen plasma     Status: None   Collection Time: 12/12/2017 10:26 PM  Result Value Ref Range   Unit Number [id number]    Blood Component Type LIQ PLASMA    Unit division 00    Status of Unit REL  FROM [facility]    Unit tag comment VERBAL ORDERS PER DR PFEIFFER    Transfusion Status      OK TO TRANSFUSE Performed at [facility], [address] [place], [place] [address]    Unit Number [id number]    Blood Component Type LIQ PLASMA    Unit division 00    Status of Unit REL FROM [facility]    Unit tag comment VERBAL ORDERS PER DR PFEIFFER    Transfusion Status OK TO TRANSFUSE   Type and screen     Status: None (Preliminary result)   Collection Time: 12/09/2017 10:34 PM  Result Value Ref Range   ABO/RH(D) A POS    Antibody Screen POS    Sample Expiration 12-22-2017    Antibody Identification      ANTI K Performed at [facility], [place] [address] [place], [place] [address]    Unit Number [id number]    Blood Component Type RED CELLS,LR    Unit division 00    Status of Unit REL FROM [facility]    Unit tag comment VERBAL ORDERS PER DR PFEIFFER    Transfusion Status OK TO TRANSFUSE    Crossmatch Result NOT NEEDED    Unit Number [id number]    Blood Component Type RED CELLS,LR    Unit division 00  Status of Unit REL FROM [facility]    Unit tag comment VERBAL ORDERS PER DR PFEIFFER    Transfusion Status OK TO TRANSFUSE    Crossmatch Result NOT NEEDED    Unit Number [id number]    Blood Component Type RED CELLS,LR    Unit division 00    Status of Unit ALLOCATED    Donor AG Type NEGATIVE FOR KELL ANTIGEN    Transfusion Status OK TO TRANSFUSE    Crossmatch Result COMPATIBLE    Unit Number [id number]    Blood Component Type RED CELLS,LR    Unit division 00    Status of Unit ALLOCATED    Donor AG Type NEGATIVE FOR KELL ANTIGEN    Transfusion Status OK TO TRANSFUSE    Crossmatch Result COMPATIBLE   I-stat troponin, ED     Status: None   Collection Time: 11/27/2017 10:43 PM  Result Value Ref Range   Troponin i, poc 0.00 0.00 - 0.08 ng/mL   Comment 3          I-stat chem 8, ed     Status: Abnormal   Collection Time: 12/21/2017 10:45 PM  Result Value Ref Range    Sodium 140 135 - 145 mmol/L   Potassium 3.9 3.5 - 5.1 mmol/L   Chloride 108 101 - 111 mmol/L   BUN 25 (H) 6 - 20 mg/dL   Creatinine, Ser 1.10 (H) 0.44 - 1.00 mg/dL   Glucose, Bld 198 (H) 65 - 99 mg/dL   Calcium, Ion 1.09 (L) 1.15 - 1.40 mmol/L   TCO2 22 22 - 32 mmol/L   Hemoglobin 13.3 12.0 - 15.0 g/dL   HCT 39.0 36.0 - 46.0 %  I-Stat CG4 Lactic Acid, ED     Status: Abnormal   Collection Time: 12/11/2017 10:45 PM  Result Value Ref Range   Lactic Acid, Venous 2.12 (HH) 0.5 - 1.9 mmol/L   Comment NOTIFIED PHYSICIAN   Comprehensive metabolic panel     Status: Abnormal   Collection Time: 12/13/2017 10:53 PM  Result Value Ref Range   Sodium 138 135 - 145 mmol/L   Potassium 3.9 3.5 - 5.1 mmol/L   Chloride 105 101 - 111 mmol/L   CO2 21 (L) 22 - 32 mmol/L   Glucose, Bld 200 (H) 65 - 99 mg/dL   BUN 26 (H) 6 - 20 mg/dL   Creatinine, Ser 1.19 (H) 0.44 - 1.00 mg/dL   Calcium 9.1 8.9 - 10.3 mg/dL   Total Protein 6.9 6.5 - 8.1 g/dL   Albumin 3.8 3.5 - 5.0 g/dL   AST 37 15 - 41 U/L   ALT 15 14 - 54 U/L   Alkaline Phosphatase 79 38 - 126 U/L   Total Bilirubin 1.1 0.3 - 1.2 mg/dL   GFR calc non Af Amer 39 (L) >60 mL/min   GFR calc Af Amer 46 (L) >60 mL/min   Anion gap 12 5 - 15  CBC     Status: None   Collection Time: 12/15/2017 10:53 PM  Result Value Ref Range   WBC 10.4 4.0 - 10.5 K/uL   RBC 4.21 3.87 - 5.11 MIL/uL   Hemoglobin 12.7 12.0 - 15.0 g/dL   HCT 40.1 36.0 - 46.0 %   MCV 95.2 78.0 - 100.0 fL   MCH 30.2 26.0 - 34.0 pg   MCHC 31.7 30.0 - 36.0 g/dL   RDW 14.7 11.5 - 15.5 %   Platelets 154 150 - 400 K/uL  Ethanol  Status: None   Collection Time: 11/30/2017 10:53 PM  Result Value Ref Range   Alcohol, Ethyl (B) <10 <10 mg/dL  Protime-INR     Status: None   Collection Time: 12/23/2017 10:53 PM  Result Value Ref Range   Prothrombin Time 13.0 11.4 - 15.2 seconds   INR 0.99   I-Stat arterial blood gas, ED     Status: Abnormal   Collection Time: 12/04/17 12:01 AM  Result Value Ref  Range   pH, Arterial 7.327 (L) 7.350 - 7.450   pCO2 arterial 44.0 32.0 - 48.0 mmHg   pO2, Arterial 208.0 (H) 83.0 - 108.0 mmHg   Bicarbonate 23.0 20.0 - 28.0 mmol/L   TCO2 24 22 - 32 mmol/L   O2 Saturation 100.0 %   Acid-base deficit 3.0 (H) 0.0 - 2.0 mmol/L   Patient temperature 98.6 F    Collection site RADIAL, ALLEN'S TEST ACCEPTABLE    Drawn by RT    Sample type ARTERIAL     Assessment & Plan: Present on Admission: . SDH (subdural hematoma) (HCC)    LOS: 0 days   Additional comments:I reviewed the patient's new clinical lab test results. and admission CT head Ground level fall TBI/large R SDH - Dr. Sherwood Gambler consulted and after discussion with her family, decision was made for comfort care Acute hypoxic vent dependent resp failure - plan terminal extubation today VTE - no Lovenox Dispo - DNR, comfort care, will plan terminal extubation today when family members arrive. Morphine drip. I spoke with her daughter at the bedside. Critical Care Total Time*: 30 Minutes  Georganna Skeans, MD, MPH, [facility] Trauma: [phone] General Surgery: [phone]  12/04/2017  *Care during the described time interval was provided by me. I have reviewed this patient's available data, including medical history, events of note, physical examination and test results as part of my evaluation.  Patient ID: Nabeeha Badertscher, female   DOB: [birth year], 84 y.o.   MRN: [id number]

## 2017-12-04 NOTE — Progress Notes (Signed)
   12/20/2017 2235  Clinical Encounter Type  Visited With Patient and family together  Visit Type Trauma;Spiritual support;ED  Referral From Nurse   Chaplain offered support to daughter [name] (husband Aaron Edelman), daughter Lambert Keto (husband Shanon Brow) and son Richardson Landry as they received bad news about their mom's condition and discerned a course of treatment. Ferried them back and forth to trauma and helped them locate her new room on [address]. Offered encouragement and a prayer. More family is coming, some perhaps before the breathing tube is to be removed.

## 2017-12-04 NOTE — Procedures (Signed)
Extubation Procedure Note  Patient Details:   Name: Yvonne [name] DOB: 04-15-1926 MRN: [id number]   Airway Documentation:     Evaluation  O2 sats: stable throughout Complications: No apparent complications Patient did tolerate procedure well. Bilateral Breath Sounds: Clear, Diminished   No   Patient extubated to room air per end of life withdrawal order. Family and RN at bedside with RT during extubation.  Renato [facility] 12/04/2017, 2:23 PM

## 2017-12-04 NOTE — Progress Notes (Signed)
Checked in with nurse.  Family/Patient pastor is with family during this time.  If chaplain in needed later, just page chaplain on call.  Conard Novak, Chaplain    12/04/17 1300  Clinical Encounter Type  Visited With Health care provider  Visit Type Follow-up  Referral From Nurse  Consult/Referral To Chaplain

## 2017-12-04 NOTE — Progress Notes (Signed)
Patient transported from ED Trauma C to 9M07 without complications.

## 2017-12-04 NOTE — Progress Notes (Signed)
Nutrition Brief Note  Pt screened for new vent. Chart reviewed.  Pt now transitioning to comfort care per Dr. Sherwood Gambler. No further nutrition interventions warranted at this time.  Please consult RD as needed.   Gaynell Face, MS, RD, LDN Pager: [phone] Weekend/After Hours: [phone]

## 2017-12-05 LAB — TYPE AND SCREEN
ABO/RH(D): A POS
Antibody Screen: POSITIVE
Donor AG Type: NEGATIVE
Donor AG Type: NEGATIVE
UNIT DIVISION: 0
UNIT DIVISION: 0
Unit division: 0
Unit division: 0

## 2017-12-05 LAB — BPAM RBC
BLOOD PRODUCT EXPIRATION DATE: 201904082359
BLOOD PRODUCT EXPIRATION DATE: 201904292359
Blood Product Expiration Date: 201904182359
Blood Product Expiration Date: 201904292359
ISSUE DATE / TIME: 201904080725
ISSUE DATE / TIME: 201904081102
UNIT TYPE AND RH: 6200
UNIT TYPE AND RH: 6200
UNIT TYPE AND RH: 9500
UNIT TYPE AND RH: 9500

## 2017-12-05 NOTE — Progress Notes (Signed)
Pt terminally extubated yesterday.  On comfort care only.   Unresponsive, but strong pulse and respirations.    Respiratory pattern changes periodically. Will transfer to palliative floor. It may take a while for her to pass.    Non survivable head injury Discussed with family.

## 2017-12-05 NOTE — Progress Notes (Signed)
Witnessed [employer] 5 mg of Morphine.

## 2017-12-05 NOTE — Social Work (Signed)
CSW acknowledging transfer of pt to 6N, and that pt is now full comfort care, will follow for recommendations and residential hospice placement if medically appropriate.   Alexander Mt, [place] Work [phone]

## 2017-12-07 ENCOUNTER — Encounter: Payer: Self-pay | Admitting: Family Medicine

## 2017-12-11 ENCOUNTER — Other Ambulatory Visit: Payer: Self-pay | Admitting: Family Medicine

## 2017-12-27 NOTE — Progress Notes (Signed)
Pt is deceased and the around 34 Ml Morphine sulfate is Wasted witnessed by [employer] and the [employer] [place]

## 2017-12-27 NOTE — Death Summary Note (Signed)
DEATH SUMMARY   Patient Details  Name: Yvonne [name] MRN: [id number] DOB: 02-11-1926  Admission/Discharge Information   Admit Date:  2017/12/19  Date of Death: 12/22/17  Time of Death: 0206  Length of Stay: 2  Referring Physician: Judeth Horn, MD   Reason(s) for Hospitalization  Fall with traumatic brain injury  Diagnoses  Preliminary cause of death: Traumatic brain injury Secondary Diagnoses (including complications and co-morbidities):  Active Problems:   SDH (subdural hematoma) [facility])   Brief Hospital Course (including significant findings, care, treatment, and services provided and events leading to death)  Yvonne [name] is a 84 y.o. year old female who was admitted after a fall. She developed a severe HA and vomited. EMS transported her to the ED. She was unresponsive and was intubated. CT head showed a large R SDH with 4cm midline shift. Neurosurgery discussed the poor prognosis with the family and they chose comfort care. She was terminally extubated the next day and kept comfortable. She expired on 2022-12-23.    Pertinent Labs and Studies  Significant Diagnostic Studies Ct Head Wo Contrast  Result Date: 2017/12/19 CLINICAL DATA:  Level 1 trauma. Fall. Patient was found unresponsive. EXAM: CT HEAD WITHOUT CONTRAST CT CERVICAL SPINE WITHOUT CONTRAST TECHNIQUE: Multidetector CT imaging of the head and cervical spine was performed following the standard protocol without intravenous contrast. Multiplanar CT image reconstructions of the cervical spine were also generated. COMPARISON:  None. FINDINGS: CT HEAD FINDINGS Brain: There is a large right subdural hematoma extending from the frontotemporal to the parietal region and measuring about 1.9 cm in maximal depth. There is associated mass effect with effacement of the right lateral ventricle and right cerebral sulci. Right to left midline shift of about 1.5 cm. There is effacement of the right basal cisterns. Fourth ventricle is  patent. Underlying cerebral atrophy and small vessel ischemic white matter changes. No intraparenchymal or intraventricular hemorrhage. In the inferior temporal lobe, there is some blood in the sulci which may indicate a small amount of subarachnoid hemorrhage. Vascular: Intracranial arterial vascular calcifications are present. Skull: The calvarium appears intact. No acute depressed skull fractures are demonstrated. Sinuses/Orbits: Paranasal sinuses and mastoid air cells are clear. Other: Subcutaneous scalp hematoma over the right temporal and posterior frontal region. CT CERVICAL SPINE FINDINGS Alignment: Normal alignment of the cervical vertebrae and posterior elements. C1-2 articulation appears intact. Skull base and vertebrae: Skull base appears intact. No vertebral compression deformities. No focal bone lesion or bone destruction. Bone cortex appears intact. Soft tissues and spinal canal: Evaluation of prevertebral soft tissues is limited due to presence of an endotracheal tube. No definite prevertebral soft tissue swelling. No paraspinal soft tissue mass or infiltration. Secretions are demonstrated in the nasopharynx and sphenoid sinus. Disc levels: Degenerative changes in the cervical spine with narrowed disc spaces and associated endplate hypertrophic changes extending from C3 through C7. Degenerative changes in the facet joints. Upper chest: Calcification, pleural thickening, and bronchiectasis in the lung apices consistent with chronic postinflammatory change. Other: None. IMPRESSION: 1. Large right subdural hematoma with maximal depth of about 1.9 cm. Significant intracranial mass effect with 1.5 cm right to left midline shift, effacement of sulci and right lateral ventricle, and effacement of right basal cisterns. 2. No acute depressed skull fractures identified. 3. Subcutaneous scalp hematoma over the right temporal and posterior frontal region. 4. Normal alignment of the cervical spine. No acute  displaced fractures identified. 5. Diffuse degenerative changes in the cervical spine. 6. Postinflammatory changes in the lung  apices. 7. Endotracheal tube in place. Secretions are demonstrated in the nasopharynx and sphenoid sinus. 8. These results were discussed at the workstation prior to the time of interpretation on 12/12/2017 at 11:15 pm with Dr. Hulen Skains , who verbally acknowledged these results. Electronically Signed   By: Lucienne Capers M.D.   On: 12/21/2017 23:16   Ct Cervical Spine Wo Contrast  Result Date: 12/16/2017 CLINICAL DATA:  Level 1 trauma. Fall. Patient was found unresponsive. EXAM: CT HEAD WITHOUT CONTRAST CT CERVICAL SPINE WITHOUT CONTRAST TECHNIQUE: Multidetector CT imaging of the head and cervical spine was performed following the standard protocol without intravenous contrast. Multiplanar CT image reconstructions of the cervical spine were also generated. COMPARISON:  None. FINDINGS: CT HEAD FINDINGS Brain: There is a large right subdural hematoma extending from the frontotemporal to the parietal region and measuring about 1.9 cm in maximal depth. There is associated mass effect with effacement of the right lateral ventricle and right cerebral sulci. Right to left midline shift of about 1.5 cm. There is effacement of the right basal cisterns. Fourth ventricle is patent. Underlying cerebral atrophy and small vessel ischemic white matter changes. No intraparenchymal or intraventricular hemorrhage. In the inferior temporal lobe, there is some blood in the sulci which may indicate a small amount of subarachnoid hemorrhage. Vascular: Intracranial arterial vascular calcifications are present. Skull: The calvarium appears intact. No acute depressed skull fractures are demonstrated. Sinuses/Orbits: Paranasal sinuses and mastoid air cells are clear. Other: Subcutaneous scalp hematoma over the right temporal and posterior frontal region. CT CERVICAL SPINE FINDINGS Alignment: Normal alignment of the  cervical vertebrae and posterior elements. C1-2 articulation appears intact. Skull base and vertebrae: Skull base appears intact. No vertebral compression deformities. No focal bone lesion or bone destruction. Bone cortex appears intact. Soft tissues and spinal canal: Evaluation of prevertebral soft tissues is limited due to presence of an endotracheal tube. No definite prevertebral soft tissue swelling. No paraspinal soft tissue mass or infiltration. Secretions are demonstrated in the nasopharynx and sphenoid sinus. Disc levels: Degenerative changes in the cervical spine with narrowed disc spaces and associated endplate hypertrophic changes extending from C3 through C7. Degenerative changes in the facet joints. Upper chest: Calcification, pleural thickening, and bronchiectasis in the lung apices consistent with chronic postinflammatory change. Other: None. IMPRESSION: 1. Large right subdural hematoma with maximal depth of about 1.9 cm. Significant intracranial mass effect with 1.5 cm right to left midline shift, effacement of sulci and right lateral ventricle, and effacement of right basal cisterns. 2. No acute depressed skull fractures identified. 3. Subcutaneous scalp hematoma over the right temporal and posterior frontal region. 4. Normal alignment of the cervical spine. No acute displaced fractures identified. 5. Diffuse degenerative changes in the cervical spine. 6. Postinflammatory changes in the lung apices. 7. Endotracheal tube in place. Secretions are demonstrated in the nasopharynx and sphenoid sinus. 8. These results were discussed at the workstation prior to the time of interpretation on 12/21/2017 at 11:15 pm with Dr. Hulen Skains , who verbally acknowledged these results. Electronically Signed   By: Lucienne Capers M.D.   On: 12/26/2017 23:16   Dg Chest Port 1 View  Result Date: 12/15/2017 CLINICAL DATA:  84 y/o F; found unresponsive, blown right pupil with posturing, level 1 trauma. EXAM: PORTABLE CHEST 1  VIEW COMPARISON:  None. FINDINGS: Mild cardiomegaly. Aortic atherosclerosis with calcification. Enlarged central pulmonary vasculature may represent pulmonary artery hypertension. Endotracheal tube tip 5.5 cm above the carina. Median sternotomy wires are aligned and intact. No  pleural effusion or pneumothorax. Multiple thoracic spine compression deformities post augmentation. IMPRESSION: Cardiomegaly. Aortic atherosclerosis. Endotracheal tube 5.5 cm above carina. No active disease. Electronically Signed   By: Kristine Garbe M.D.   On: 12/02/2017 23:17    Microbiology No results found for this or any previous visit (from the past 240 hour(s)).  Lab Basic Metabolic Panel: Recent Labs  Lab 12/02/2017 2245 12/24/2017 2253  NA 140 138  K 3.9 3.9  CL 108 105  CO2  --  21*  GLUCOSE 198* 200*  BUN 25* 26*  CREATININE 1.10* 1.19*  CALCIUM  --  9.1   Liver Function Tests: Recent Labs  Lab 11/30/2017 2253  AST 37  ALT 15  ALKPHOS 79  BILITOT 1.1  PROT 6.9  ALBUMIN 3.8   No results for input(s): LIPASE, AMYLASE in the last 168 hours. No results for input(s): AMMONIA in the last 168 hours. CBC: Recent Labs  Lab 12/17/2017 2245 12/26/2017 2253  WBC  --  10.4  HGB 13.3 12.7  HCT 39.0 40.1  MCV  --  95.2  PLT  --  154   Cardiac Enzymes: No results for input(s): CKTOTAL, CKMB, CKMBINDEX, TROPONINI in the last 168 hours. Sepsis Labs: Recent Labs  Lab 12/02/2017 2245 12/23/2017 2253  WBC  --  10.4  LATICACIDVEN 2.12*  --     Procedures/Operations  intubation   Zenovia Jarred 12/07/2017, 12:22 PM

## 2017-12-27 DEATH — deceased

## 2018-04-22 IMAGING — CR DG CHEST 2V
2 series · 2 of 2 positions shown · non-contrast
Comparison: Chest x-ray of January 10, 2017.

CLINICAL DATA: Cough and chest congestion for the past week.
Nonsmoker. History of aortic insufficiency, atrial fibrillation,
ascending aortic replacement in the past nonsmoker.

EXAM:
CHEST  2 VIEWk

[w chest pa]
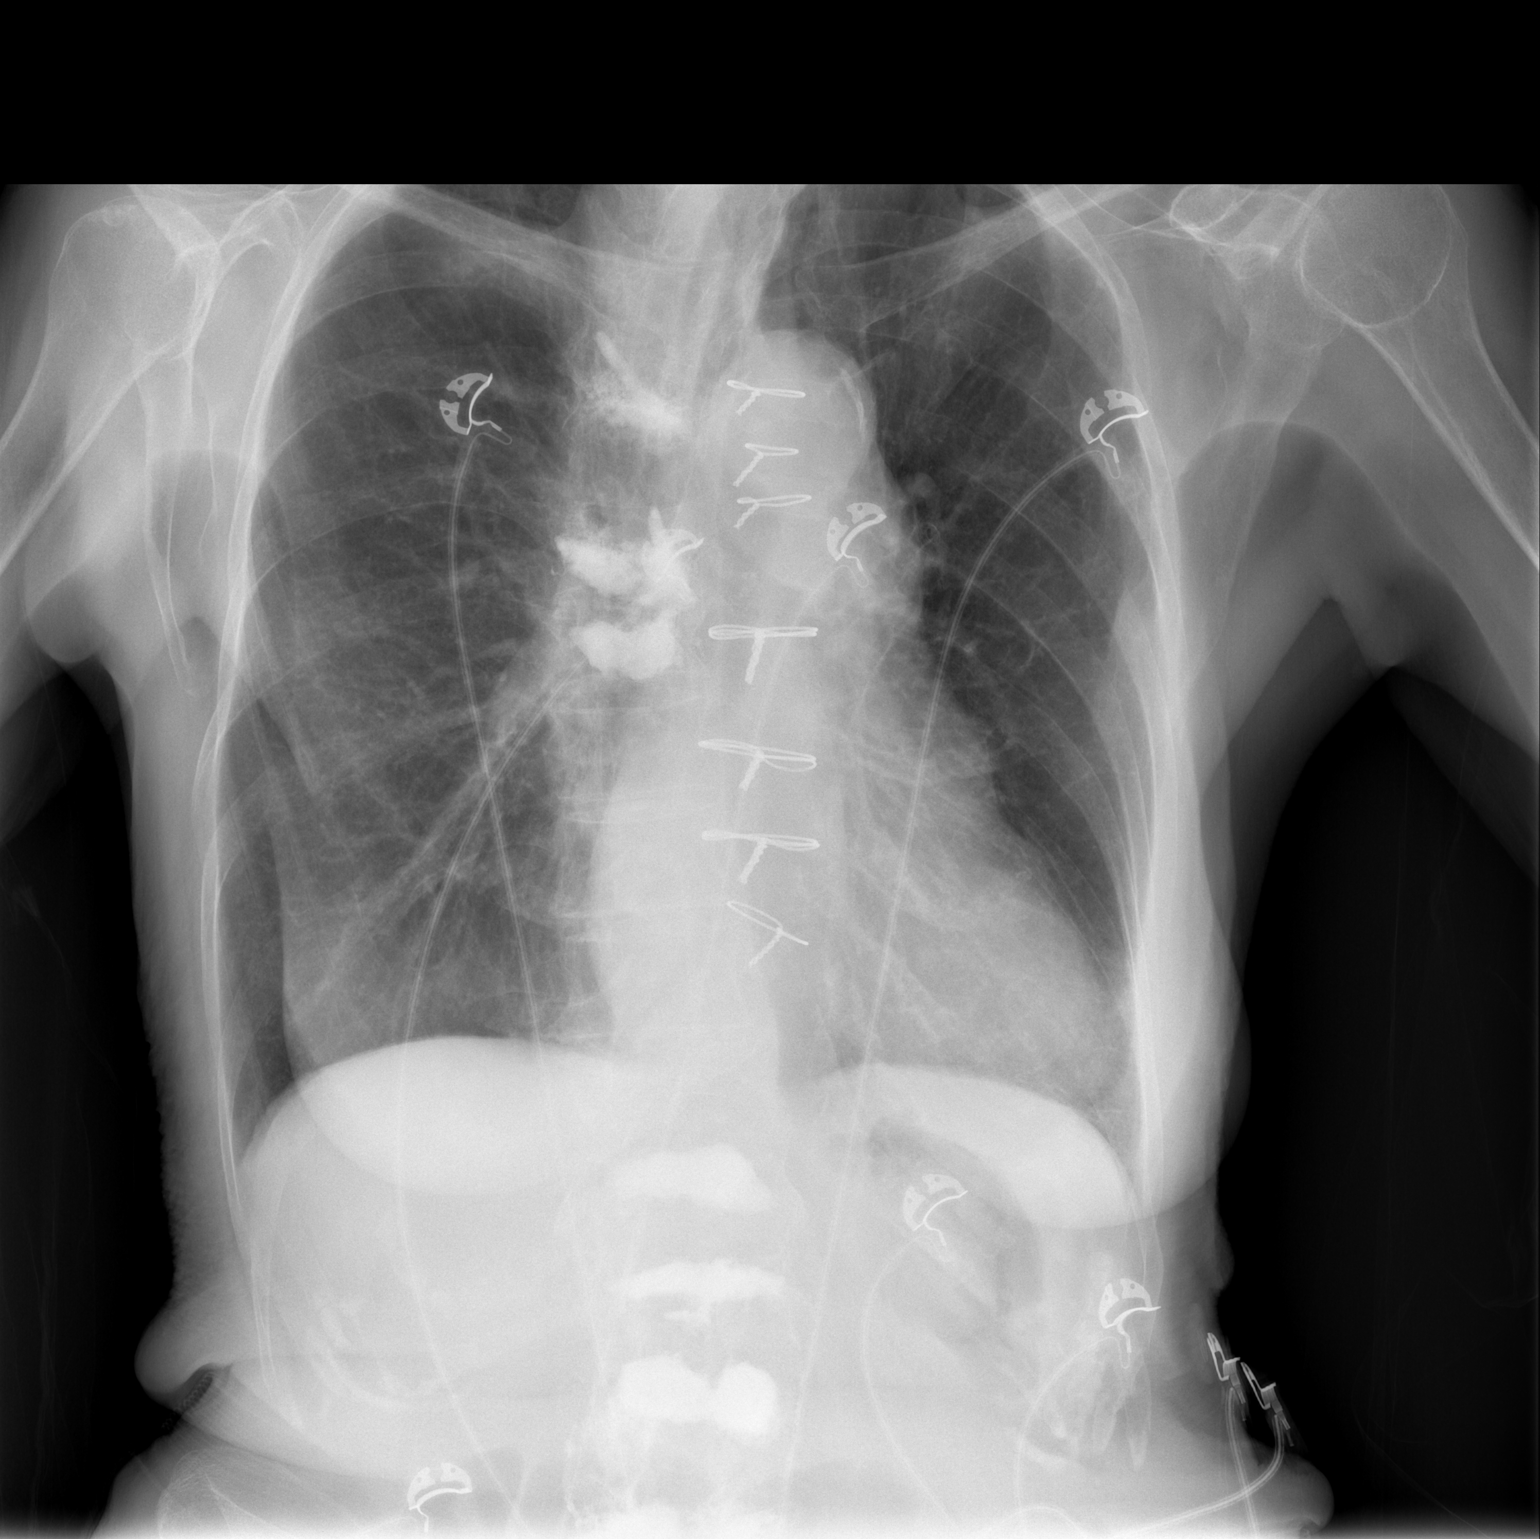

[w chest lat]
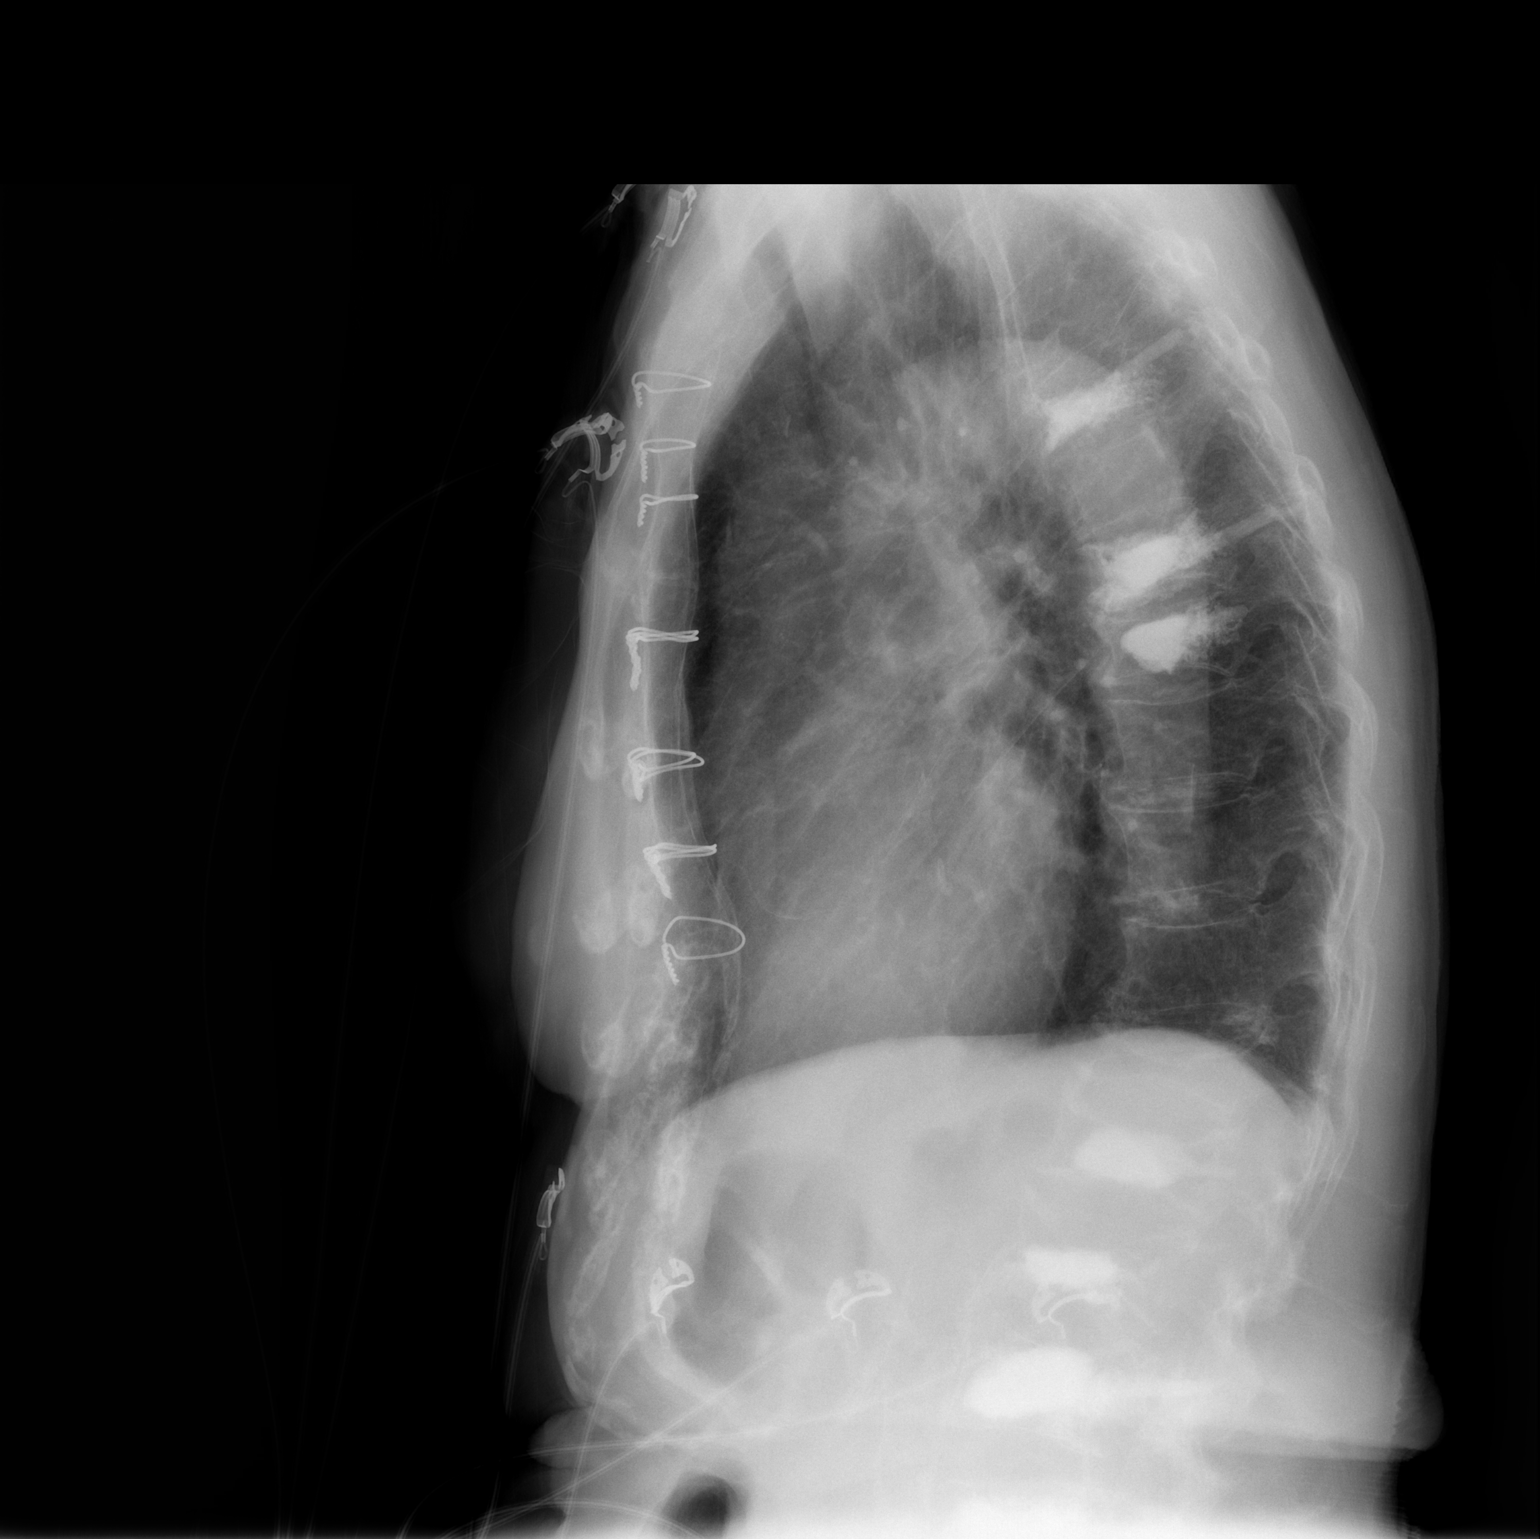

[2 of 2 positions shown; findings below may reference images not displayed]

FINDINGS: The lungs remain mildly hyperinflated with hemidiaphragm flattening.
There is no focal infiltrate. There is stable biapical pleural
thickening. There is no pleural effusion. The heart is normal in
size. There are post median sternotomy changes. There is
calcification in the wall of the aortic arch. The patient has
undergone kyphoplasty in the midthoracic spine and in the lumbar
spine. There is high-grade compression of the body of T12 which has
not been treated with kyphoplasty. It is unchanged since a study of
IMPRESSION: Chronic bronchitic-emphysematous changes. No pneumonia nor CHF. Post
median sternotomy changes.

Thoracic aortic atherosclerosis.
